# Patient Record
Sex: Male | Born: 1955 | Race: White | Hispanic: No | Marital: Married | Smoking: Never smoker
Health system: Southern US, Community
[De-identification: ages and names within clinical notes are randomized; demographics above are authoritative.]

## PROBLEM LIST (undated history)

## (undated) DIAGNOSIS — I10 Essential (primary) hypertension: Secondary | ICD-10-CM

## (undated) DIAGNOSIS — E7800 Pure hypercholesterolemia, unspecified: Secondary | ICD-10-CM

## (undated) DIAGNOSIS — G4733 Obstructive sleep apnea (adult) (pediatric): Secondary | ICD-10-CM

## (undated) DIAGNOSIS — K219 Gastro-esophageal reflux disease without esophagitis: Secondary | ICD-10-CM

## (undated) DIAGNOSIS — I5032 Chronic diastolic (congestive) heart failure: Secondary | ICD-10-CM

## (undated) DIAGNOSIS — E119 Type 2 diabetes mellitus without complications: Secondary | ICD-10-CM

## (undated) DIAGNOSIS — G4730 Sleep apnea, unspecified: Secondary | ICD-10-CM

## (undated) DIAGNOSIS — R0602 Shortness of breath: Secondary | ICD-10-CM

## (undated) DIAGNOSIS — I2699 Other pulmonary embolism without acute cor pulmonale: Secondary | ICD-10-CM

## (undated) DIAGNOSIS — D689 Coagulation defect, unspecified: Secondary | ICD-10-CM

## (undated) DIAGNOSIS — Z9989 Dependence on other enabling machines and devices: Secondary | ICD-10-CM

## (undated) DIAGNOSIS — L02419 Cutaneous abscess of limb, unspecified: Secondary | ICD-10-CM

## (undated) DIAGNOSIS — E669 Obesity, unspecified: Secondary | ICD-10-CM

## (undated) DIAGNOSIS — L03119 Cellulitis of unspecified part of limb: Secondary | ICD-10-CM

## (undated) DIAGNOSIS — C439 Malignant melanoma of skin, unspecified: Secondary | ICD-10-CM

## (undated) DIAGNOSIS — L03115 Cellulitis of right lower limb: Secondary | ICD-10-CM

## (undated) DIAGNOSIS — I82409 Acute embolism and thrombosis of unspecified deep veins of unspecified lower extremity: Secondary | ICD-10-CM

## (undated) DIAGNOSIS — J40 Bronchitis, not specified as acute or chronic: Secondary | ICD-10-CM

## (undated) DIAGNOSIS — M1990 Unspecified osteoarthritis, unspecified site: Secondary | ICD-10-CM

## (undated) DIAGNOSIS — R609 Edema, unspecified: Secondary | ICD-10-CM

## (undated) HISTORY — PX: SKIN CANCER EXCISION: SHX779

## (undated) HISTORY — DX: Cellulitis of right lower limb: L03.115

## (undated) HISTORY — DX: Sleep apnea, unspecified: G47.30

## (undated) HISTORY — DX: Coagulation defect, unspecified: D68.9

## (undated) HISTORY — DX: Other pulmonary embolism without acute cor pulmonale: I26.99

## (undated) HISTORY — DX: Essential (primary) hypertension: I10

## (undated) HISTORY — DX: Malignant melanoma of skin, unspecified: C43.9

## (undated) HISTORY — DX: Chronic diastolic (congestive) heart failure: I50.32

## (undated) HISTORY — DX: Type 2 diabetes mellitus without complications: E11.9

## (undated) HISTORY — PX: TONSILLECTOMY: SUR1361

## (undated) HISTORY — PX: HERNIA REPAIR: SHX51

---

## 2004-07-17 DIAGNOSIS — I82409 Acute embolism and thrombosis of unspecified deep veins of unspecified lower extremity: Secondary | ICD-10-CM

## 2004-07-17 HISTORY — DX: Acute embolism and thrombosis of unspecified deep veins of unspecified lower extremity: I82.409

## 2004-09-20 ENCOUNTER — Emergency Department: Payer: Self-pay | Admitting: Emergency Medicine

## 2005-10-18 ENCOUNTER — Inpatient Hospital Stay (HOSPITAL_COMMUNITY): Admission: EM | Admit: 2005-10-18 | Discharge: 2005-10-20 | Payer: Self-pay | Admitting: Emergency Medicine

## 2005-10-18 ENCOUNTER — Ambulatory Visit: Payer: Self-pay | Admitting: Pulmonary Disease

## 2007-10-16 ENCOUNTER — Ambulatory Visit: Payer: Self-pay | Admitting: Family Medicine

## 2008-07-15 ENCOUNTER — Emergency Department: Payer: Self-pay | Admitting: Emergency Medicine

## 2008-07-16 ENCOUNTER — Ambulatory Visit: Payer: Self-pay | Admitting: Family Medicine

## 2008-07-18 ENCOUNTER — Emergency Department: Payer: Self-pay | Admitting: Emergency Medicine

## 2009-03-05 ENCOUNTER — Other Ambulatory Visit: Payer: Self-pay | Admitting: Family Medicine

## 2010-04-25 ENCOUNTER — Other Ambulatory Visit: Payer: Self-pay | Admitting: Family Medicine

## 2010-04-26 LAB — PSA

## 2011-05-18 ENCOUNTER — Other Ambulatory Visit: Payer: Self-pay | Admitting: Family Medicine

## 2011-09-18 ENCOUNTER — Encounter (HOSPITAL_COMMUNITY): Payer: Self-pay | Admitting: Emergency Medicine

## 2011-09-18 ENCOUNTER — Emergency Department (HOSPITAL_COMMUNITY)
Admission: EM | Admit: 2011-09-18 | Discharge: 2011-09-18 | Disposition: A | Discharge status: Home / Self Care | Payer: Self-pay | Attending: Emergency Medicine | Admitting: Emergency Medicine

## 2011-09-18 DIAGNOSIS — E119 Type 2 diabetes mellitus without complications: Secondary | ICD-10-CM | POA: Insufficient documentation

## 2011-09-18 DIAGNOSIS — I82819 Embolism and thrombosis of superficial veins of unspecified lower extremities: Secondary | ICD-10-CM

## 2011-09-18 DIAGNOSIS — I509 Heart failure, unspecified: Secondary | ICD-10-CM | POA: Insufficient documentation

## 2011-09-18 DIAGNOSIS — M7989 Other specified soft tissue disorders: Secondary | ICD-10-CM

## 2011-09-18 DIAGNOSIS — I10 Essential (primary) hypertension: Secondary | ICD-10-CM | POA: Insufficient documentation

## 2011-09-18 DIAGNOSIS — Z86718 Personal history of other venous thrombosis and embolism: Secondary | ICD-10-CM | POA: Insufficient documentation

## 2011-09-18 DIAGNOSIS — E7800 Pure hypercholesterolemia, unspecified: Secondary | ICD-10-CM | POA: Insufficient documentation

## 2011-09-18 HISTORY — DX: Pure hypercholesterolemia, unspecified: E78.00

## 2011-09-18 HISTORY — DX: Acute embolism and thrombosis of unspecified deep veins of unspecified lower extremity: I82.409

## 2011-09-18 HISTORY — DX: Obesity, unspecified: E66.9

## 2011-09-18 NOTE — ED Notes (Signed)
Pt alert, NAD, calm, interactive, skin W&D,resps e/u, vasc doppler tech & supervisor paged to confirm study.

## 2011-09-18 NOTE — ED Provider Notes (Signed)
Michael Martinez S 8:00 PM patient discussed with Dr. Denton Lank in sign out. Patient presents with left lower leg swelling and has history of DVT. Patient awaiting vascular Doppler study. The study positive plan treat with Lovenox and Coumadin with followup. If negative patient is fine to return home with followup.  8:40 PM vascular Dopplers check is now bedside performing study.   Preliminary report shows: "Superficial thrombosis noted in the left GSV from mid thigh to the ankle. No evidence of DVT."  I discussed findings with patient. At this time treat symptoms with warm compress, elevation and compression stockings.  Patient instructed to follow up with PCP.    Angus Seller, [name] 09/18/11 2219

## 2011-09-18 NOTE — ED Notes (Signed)
PT. REPORTS PERSISTENT LEFT ANKLE/LEFT LOWER LEG SWELLING FOR 1 WEEK , DENIES INJURY OR FALL , SEEN AT AN URGENT CARE - ADVISED TO INCREASE GOUT MEDICATIONS WITH NO RELIEF , CONCERNED ABOUT DVT .

## 2011-09-18 NOTE — ED Notes (Signed)
Spoke with Vascular Tech,stated it will be about a hour before study is done

## 2011-09-18 NOTE — ED Notes (Signed)
Vasc. Tech contacted, leaving [facility] now, will be about 20 minutes, pt & family updated.

## 2011-09-18 NOTE — ED Notes (Signed)
EDPA into see/speak with pt, vasc tech at [facility], pt moved to room 25, no changes, report given to [facility], Charity fundraiser. Pt calm, NAD, alert, interactive.

## 2011-09-18 NOTE — ED Notes (Signed)
Onset one week ago left lower extremity swelling improves while laying down however upon standing for long period of time edema returned.  Currently left lower extremity edema +2 with pedal pulse left foot +1 right pedal pulse +2.  Pain at rest 0/10 with palpation or ambulating pain increases 2/10 achy dull. Airway intact bilateral equal chest rise and fall. Two family members at bedside

## 2011-09-18 NOTE — ED Notes (Signed)
Pt. Changed into a gown and given a pillow for comfort.

## 2011-09-18 NOTE — Progress Notes (Signed)
VASCULAR LAB PRELIMINARY  PRELIMINARY  PRELIMINARY  PRELIMINARY  Left lower extremity venous duplex completed.    Preliminary report:  Superficial thrombosis noted in the left GSV from mid thigh to the ankle.   No evidence of DVT.  Terance Hart, RVT 09/18/2011, 9:07 PM

## 2011-09-18 NOTE — ED Provider Notes (Signed)
History     CSN: [id number]  Arrival date & time 09/18/11  1426   First MD Initiated Contact with Patient 09/18/11 1812      Chief Complaint  Patient presents with  . Leg Swelling    (Consider location/radiation/quality/duration/timing/severity/associated sxs/prior treatment) The history is provided by the patient.  pt with left lower leg swelling x 1 week. Gradual onset. Constant. Hx dvt 2006, not on coumadin for years. No chest pain or sob. Also states hx gout, but swelling no better after trying his gout meds. No swelling to other leg. No numbness/weakness. No skin changes or erythema. No sob or orthopnea. No injury to leg.   Past Medical History  Diagnosis Date  . DVT (deep venous thrombosis)   . Gout   . Obesity   . Hypertension   . Diabetes mellitus   . Hypercholesteremia   . CHF (congestive heart failure)     History reviewed. No pertinent past surgical history.  No family history on file.  History  Substance Use Topics  . Smoking status: Never Smoker   . Smokeless tobacco: Not on file  . Alcohol Use: No      Review of Systems  Respiratory: Negative for shortness of breath.   Cardiovascular: Positive for leg swelling. Negative for chest pain.  Skin: Negative for rash.  Neurological: Negative for syncope and light-headedness.    Allergies  Penicillins  Home Medications  No current outpatient prescriptions on file.  BP 151/95  Pulse 81  Temp(Src) 98.3 F (36.8 C) (Oral)  Resp 18  SpO2 98%  Physical Exam  Nursing note and vitals reviewed. Constitutional: He is oriented to person, place, and time. He appears well-developed and well-nourished. No distress.  HENT:  Head: Atraumatic.  Eyes: Pupils are equal, round, and reactive to light.  Neck: Neck supple. No tracheal deviation present.  Cardiovascular: Normal rate.   Pulmonary/Chest: Effort normal. No accessory muscle usage. No respiratory distress.  Abdominal: He exhibits no distension.    Musculoskeletal: Normal range of motion.       Left lower leg moderately swollen as compared to right. Distal pulses palp. No pain w rom at hip, knee or ankle. No skin changes or erythema. No focal sts or focal bony tenderness.   Neurological: He is alert and oriented to person, place, and time.       Motor intact bil  Skin: Skin is warm and dry.  Psychiatric: He has a normal mood and affect.    ED Course  Procedures (including critical care time)    MDM  Vascular doppler ordered (given hx, phys exam, suspect possible dvt).   Vascular doppler pending - pt informed of delay - tech at [facility].   Signed out to oncoming ST PA to check vascular doppler when back. If positive, will need rx w lovenox and coumadin, dvt instructions, and pcp follow up in the next couple days. If neg, elevation, close pcp f/u.      Suzi Roots, MD 09/18/11 1949

## 2011-09-18 NOTE — Discharge Instructions (Signed)
You were seen and evaluated today for swelling of your left lower leg. At this time your ultrasound study showed a small superficial blood clot. This can be treated with warm compresses, compression stockings and elevation. It is also recommended that you've discuss with your primary care provider about taking a baby aspirin daily. You should call your doctor for a followup appointment first thing tomorrow. If you develop any worsening symptoms, chest pain, shortness of breath please return to the emergency room.  Edema Edema is an abnormal build-up of fluids in tissues. Because this is partly dependent on gravity (water flows to the lowest place), it is more common in the leg sand thighs (lower extremities). It is also common in the looser tissues, like around the eyes. Painless swelling of the feet and ankles is common and increases as a person ages. It may affect both legs and may include the calves or even thighs. When squeezed, the fluid may move out of the affected area and may leave a dent for a few moments. CAUSES   Prolonged standing or sitting in one place for extended periods of time. Movement helps pump tissue fluid into the veins, and absence of movement prevents this, resulting in edema.   Varicose veins. The valves in the veins do not work as well as they should. This causes fluid to leak into the tissues.   Fluid and salt overload.   Injury, burn, or surgery to the leg, ankle, or foot, may damage veins and allow fluid to leak out.   Sunburn damages vessels. Leaky vessels allow fluid to go out into the sunburned tissues.   Allergies (from insect bites or stings, medications or chemicals) cause swelling by allowing vessels to become leaky.   Protein in the blood helps keep fluid in your vessels. Low protein, as in malnutrition, allows fluid to leak out.   Hormonal changes, including pregnancy and menstruation, cause fluid retention. This fluid may leak out of vessels and cause  edema.   Medications that cause fluid retention. Examples are sex hormones, blood pressure medications, steroid treatment, or anti-depressants.   Some illnesses cause edema, especially heart failure, kidney disease, or liver disease.   Surgery that cuts veins or lymph nodes, such as surgery done for the heart or for breast cancer, may result in edema.  DIAGNOSIS  Your caregiver is usually easily able to determine what is causing your swelling (edema) by simply asking what is wrong (getting a history) and examining you (doing a physical). Sometimes x-rays, EKG (electrocardiogram or heart tracing), and blood work may be done to evaluate for underlying medical illness. TREATMENT  General treatment includes:  Leg elevation (or elevation of the affected body part).   Restriction of fluid intake.   Prevention of fluid overload.   Compression of the affected body part. Compression with elastic bandages or support stockings squeezes the tissues, preventing fluid from entering and forcing it back into the blood vessels.   Diuretics (also called water pills or fluid pills) pull fluid out of your body in the form of increased urination. These are effective in reducing the swelling, but can have side effects and must be used only under your caregiver's supervision. Diuretics are appropriate only for some types of edema.  The specific treatment can be directed at any underlying causes discovered. Heart, liver, or kidney disease should be treated appropriately. HOME CARE INSTRUCTIONS   Elevate the legs (or affected body part) above the level of the heart, while lying down.  Avoid sitting or standing still for prolonged periods of time.   Avoid putting anything directly under the knees when lying down, and do not wear constricting clothing or garters on the upper legs.   Exercising the legs causes the fluid to work back into the veins and lymphatic channels. This may help the swelling go down.    The pressure applied by elastic bandages or support stockings can help reduce ankle swelling.   A low-salt diet may help reduce fluid retention and decrease the ankle swelling.   Take any medications exactly as prescribed.  SEEK MEDICAL CARE IF:  Your edema is not responding to recommended treatments. SEEK IMMEDIATE MEDICAL CARE IF:   You develop shortness of breath or chest pain.   You cannot breathe when you lay down; or if, while lying down, you have to get up and go to the window to get your breath.   You are having increasing swelling without relief from treatment.   You develop a fever over 102 F (38.9 C).   You develop pain or redness in the areas that are swollen.   Tell your caregiver right away if you have gained 3 lb/1.4 kg in 1 day or 5 lb/2.3 kg in a week.  MAKE SURE YOU:   Understand these instructions.   Will watch your condition.   Will get help right away if you are not doing well or get worse.  Document Released: 07/03/2005 Document Revised: 06/22/2011 Document Reviewed: 02/19/2008 [facility] Patient Information 2012 [place], [place].    RESOURCE GUIDE  Dental Problems  Patients with Medicaid: [facility] [phone] [address]                                           [phone] Phone:  [phone]                                                  Phone:  [phone]  If unable to pay or uninsured, contact:  [facility] or [facility]. to become qualified for the adult dental clinic.  Chronic Pain Problems Contact [facility]  [phone] Patients need to be referred by their primary care doctor.  Insufficient Money for Medicine Contact [employer]:  call "211" or [facility] 806-101-9404.  No Primary Care Doctor Call [facility]  424-136-5455 Other agencies that provide inexpensive medical care    [facility]  [phone]    [facility]  Internal Medicine  [phone]    [facility]  [phone]    [facility]  [phone]    [facility]  [phone]    [facility]  [phone]  Psychological Services [facility]  [phone] [facility] Services  [phone] [facility]   [phone] (emergency services [phone])  Substance Abuse Resources [facility]  [phone] [facility] [phone] [facility] [phone] Floydene Flock [phone] Residential & Outpatient Substance Abuse Program  ([phone]  Abuse/Neglect [facility] Child Abuse Hotline 807-408-7581 [facility] Child Abuse Hotline 306-331-0189 (After Hours)  Emergency Shelter [employer] (601)742-1011  Maternity Homes [facility] [phone] [facility] [phone]  MRSA Hotline #:   518-540-1804    [facility] Resources  [facility]     [employer]                          [facility] Dept. [address] [place]                       [address]      [address]  Blondell Reveal Phone:  [phone]                                   Phone:  [phone]                 Phone:  [phone]  [facility] Phone:  [phone]  [facility] Child Abuse Hotline [phone] (After Hours)

## 2011-09-19 NOTE — ED Provider Notes (Signed)
Medical screening examination/treatment/procedure(s) were conducted as a shared visit with non-physician practitioner(s) and myself.  I personally evaluated the patient during the encounter   Suzi Roots, MD 09/19/11 1431

## 2011-10-18 ENCOUNTER — Ambulatory Visit: Payer: Self-pay | Admitting: Cardiovascular Disease

## 2012-10-11 ENCOUNTER — Inpatient Hospital Stay (HOSPITAL_COMMUNITY)
Admission: EM | Admit: 2012-10-11 | Discharge: 2012-10-12 | DRG: 603 | Disposition: A | Discharge status: Home / Self Care | Payer: MEDICAID | Attending: Internal Medicine | Admitting: Internal Medicine

## 2012-10-11 ENCOUNTER — Encounter (HOSPITAL_COMMUNITY): Payer: Self-pay | Admitting: *Deleted

## 2012-10-11 DIAGNOSIS — Z880 Allergy status to penicillin: Secondary | ICD-10-CM

## 2012-10-11 DIAGNOSIS — I10 Essential (primary) hypertension: Secondary | ICD-10-CM

## 2012-10-11 DIAGNOSIS — L03115 Cellulitis of right lower limb: Secondary | ICD-10-CM

## 2012-10-11 DIAGNOSIS — I509 Heart failure, unspecified: Secondary | ICD-10-CM | POA: Diagnosis present

## 2012-10-11 DIAGNOSIS — L02419 Cutaneous abscess of limb, unspecified: Principal | ICD-10-CM | POA: Diagnosis present

## 2012-10-11 DIAGNOSIS — Z6841 Body Mass Index (BMI) 40.0 and over, adult: Secondary | ICD-10-CM

## 2012-10-11 DIAGNOSIS — E119 Type 2 diabetes mellitus without complications: Secondary | ICD-10-CM | POA: Diagnosis present

## 2012-10-11 DIAGNOSIS — G4733 Obstructive sleep apnea (adult) (pediatric): Secondary | ICD-10-CM

## 2012-10-11 DIAGNOSIS — E7800 Pure hypercholesterolemia, unspecified: Secondary | ICD-10-CM | POA: Diagnosis present

## 2012-10-11 DIAGNOSIS — B009 Herpesviral infection, unspecified: Secondary | ICD-10-CM | POA: Diagnosis present

## 2012-10-11 DIAGNOSIS — Z86718 Personal history of other venous thrombosis and embolism: Secondary | ICD-10-CM

## 2012-10-11 DIAGNOSIS — Z79899 Other long term (current) drug therapy: Secondary | ICD-10-CM

## 2012-10-11 DIAGNOSIS — M109 Gout, unspecified: Secondary | ICD-10-CM | POA: Diagnosis present

## 2012-10-11 LAB — CBC WITH DIFFERENTIAL/PLATELET
Basophils Absolute: 0 10*3/uL (ref 0.0–0.1)
Basophils Relative: 0 % (ref 0–1)
Eosinophils Absolute: 0.2 10*3/uL (ref 0.0–0.7)
Eosinophils Relative: 2 % (ref 0–5)
HCT: 41.4 % (ref 39.0–52.0)
Hemoglobin: 14.5 g/dL (ref 13.0–17.0)
MCHC: 35 g/dL (ref 30.0–36.0)
MCV: 82.6 fL (ref 78.0–100.0)
Monocytes Relative: 7 % (ref 3–12)
Platelets: 188 10*3/uL (ref 150–400)

## 2012-10-11 LAB — COMPREHENSIVE METABOLIC PANEL
ALT: 18 U/L (ref 0–53)
AST: 18 U/L (ref 0–37)
Alkaline Phosphatase: 111 U/L (ref 39–117)
BUN: 20 mg/dL (ref 6–23)
Calcium: 9.1 mg/dL (ref 8.4–10.5)
Total Bilirubin: 0.3 mg/dL (ref 0.3–1.2)
Total Protein: 6.8 g/dL (ref 6.0–8.3)

## 2012-10-11 MED ORDER — ACETAMINOPHEN 325 MG PO TABS
650.0000 mg | ORAL_TABLET | Freq: Once | ORAL | Status: AC
  Administered 2012-10-12: 650 mg via ORAL
  Filled 2012-10-11: qty 2

## 2012-10-11 MED ORDER — CLINDAMYCIN PHOSPHATE 600 MG/50ML IV SOLN
600.0000 mg | Freq: Four times a day (QID) | INTRAVENOUS | Status: DC
  Administered 2012-10-12 (×3): 600 mg via INTRAVENOUS
  Filled 2012-10-11 (×6): qty 50

## 2012-10-11 NOTE — ED Notes (Signed)
CBG checked 149.  

## 2012-10-11 NOTE — ED Notes (Signed)
Pt c/o   Redness swelling and pain inhis rt lower leg with a temp on Wednesday.  Hx of dvt lt lower leg.   The pt  Is a diabetic

## 2012-10-12 DIAGNOSIS — I10 Essential (primary) hypertension: Secondary | ICD-10-CM

## 2012-10-12 DIAGNOSIS — L03115 Cellulitis of right lower limb: Secondary | ICD-10-CM

## 2012-10-12 DIAGNOSIS — R609 Edema, unspecified: Secondary | ICD-10-CM

## 2012-10-12 DIAGNOSIS — E119 Type 2 diabetes mellitus without complications: Secondary | ICD-10-CM

## 2012-10-12 DIAGNOSIS — M79609 Pain in unspecified limb: Secondary | ICD-10-CM

## 2012-10-12 DIAGNOSIS — G4733 Obstructive sleep apnea (adult) (pediatric): Secondary | ICD-10-CM

## 2012-10-12 DIAGNOSIS — Z9989 Dependence on other enabling machines and devices: Secondary | ICD-10-CM

## 2012-10-12 DIAGNOSIS — L02419 Cutaneous abscess of limb, unspecified: Principal | ICD-10-CM

## 2012-10-12 LAB — HEPARIN LEVEL (UNFRACTIONATED): Heparin Unfractionated: 0.11 IU/mL — ABNORMAL LOW (ref 0.30–0.70)

## 2012-10-12 LAB — COMPREHENSIVE METABOLIC PANEL
AST: 12 U/L (ref 0–37)
CO2: 26 mEq/L (ref 19–32)
Calcium: 8.7 mg/dL (ref 8.4–10.5)
Chloride: 102 mEq/L (ref 96–112)
GFR calc Af Amer: 78 mL/min — ABNORMAL LOW (ref >90)
Potassium: 3.7 mEq/L (ref 3.5–5.1)
Sodium: 138 mEq/L (ref 135–145)

## 2012-10-12 LAB — CBC
HCT: 38.6 % — ABNORMAL LOW (ref 39.0–52.0)
MCV: 82.5 fL (ref 78.0–100.0)
Platelets: 186 10*3/uL (ref 150–400)
RBC: 4.68 MIL/uL (ref 4.22–5.81)
RDW: 13.9 % (ref 11.5–15.5)
WBC: 6.2 10*3/uL (ref 4.0–10.5)

## 2012-10-12 LAB — GLUCOSE, CAPILLARY

## 2012-10-12 LAB — HEMOGLOBIN A1C
Hgb A1c MFr Bld: 7 % — ABNORMAL HIGH (ref <5.7)
Mean Plasma Glucose: 154 mg/dL — ABNORMAL HIGH (ref <117)

## 2012-10-12 MED ORDER — CLINDAMYCIN HCL 300 MG PO CAPS: 300.0000 mg | ORAL_CAPSULE | Freq: Three times a day (TID) | ORAL | Status: DC

## 2012-10-12 MED ORDER — SIMVASTATIN 20 MG PO TABS
20.0000 mg | ORAL_TABLET | Freq: Every day | ORAL | Status: DC
  Filled 2012-10-12: qty 1

## 2012-10-12 MED ORDER — ACETAMINOPHEN 650 MG RE SUPP: 650.0000 mg | Freq: Four times a day (QID) | RECTAL | Status: DC | PRN

## 2012-10-12 MED ORDER — DIAZEPAM 5 MG PO TABS: 5.0000 mg | ORAL_TABLET | Freq: Three times a day (TID) | ORAL | Status: DC | PRN

## 2012-10-12 MED ORDER — SODIUM CHLORIDE 0.9 % IV SOLN
INTRAVENOUS | Status: DC
  Administered 2012-10-12: 05:00:00 via INTRAVENOUS

## 2012-10-12 MED ORDER — INSULIN ASPART 100 UNIT/ML ~~LOC~~ SOLN
0.0000 [IU] | SUBCUTANEOUS | Status: DC
  Administered 2012-10-12: 1 [IU] via SUBCUTANEOUS
  Administered 2012-10-12: 2 [IU] via SUBCUTANEOUS

## 2012-10-12 MED ORDER — HEPARIN BOLUS VIA INFUSION
4000.0000 [IU] | Freq: Once | INTRAVENOUS | Status: AC
  Administered 2012-10-12: 4000 [IU] via INTRAVENOUS

## 2012-10-12 MED ORDER — ONDANSETRON HCL 4 MG/2ML IJ SOLN: 4.0000 mg | Freq: Four times a day (QID) | INTRAMUSCULAR | Status: DC | PRN

## 2012-10-12 MED ORDER — VALACYCLOVIR HCL 500 MG PO TABS
500.0000 mg | ORAL_TABLET | Freq: Every day | ORAL | Status: DC | PRN
  Administered 2012-10-12: 500 mg via ORAL
  Filled 2012-10-12 (×2): qty 1

## 2012-10-12 MED ORDER — HYDROCHLOROTHIAZIDE 12.5 MG PO CAPS
12.5000 mg | ORAL_CAPSULE | Freq: Every day | ORAL | Status: DC
  Administered 2012-10-12: 12.5 mg via ORAL
  Filled 2012-10-12: qty 1

## 2012-10-12 MED ORDER — ALUM & MAG HYDROXIDE-SIMETH 200-200-20 MG/5ML PO SUSP: 30.0000 mL | Freq: Four times a day (QID) | ORAL | Status: DC | PRN

## 2012-10-12 MED ORDER — ONDANSETRON HCL 4 MG PO TABS: 4.0000 mg | ORAL_TABLET | Freq: Four times a day (QID) | ORAL | Status: DC | PRN

## 2012-10-12 MED ORDER — LISINOPRIL-HYDROCHLOROTHIAZIDE 20-12.5 MG PO TABS: 1.0000 | ORAL_TABLET | Freq: Every day | ORAL | Status: DC

## 2012-10-12 MED ORDER — HEPARIN (PORCINE) IN NACL 100-0.45 UNIT/ML-% IJ SOLN
1800.0000 [IU]/h | INTRAMUSCULAR | Status: DC
  Administered 2012-10-12: 1800 [IU]/h via INTRAVENOUS
  Administered 2012-10-12: 1500 [IU]/h via INTRAVENOUS
  Filled 2012-10-12 (×2): qty 250

## 2012-10-12 MED ORDER — HYDROCODONE-ACETAMINOPHEN 5-325 MG PO TABS: 1.0000 | ORAL_TABLET | ORAL | Status: DC | PRN

## 2012-10-12 MED ORDER — ACETAMINOPHEN 325 MG PO TABS: 650.0000 mg | ORAL_TABLET | Freq: Four times a day (QID) | ORAL | Status: DC | PRN

## 2012-10-12 MED ORDER — FUROSEMIDE 20 MG PO TABS
10.0000 mg | ORAL_TABLET | Freq: Every day | ORAL | Status: DC | PRN
  Filled 2012-10-12: qty 0.5

## 2012-10-12 MED ORDER — LORATADINE 10 MG PO TABS
10.0000 mg | ORAL_TABLET | Freq: Every day | ORAL | Status: DC
  Administered 2012-10-12: 10 mg via ORAL
  Filled 2012-10-12: qty 1

## 2012-10-12 MED ORDER — LISINOPRIL 20 MG PO TABS
20.0000 mg | ORAL_TABLET | Freq: Every day | ORAL | Status: DC
  Administered 2012-10-12: 20 mg via ORAL
  Filled 2012-10-12: qty 1

## 2012-10-12 NOTE — H&P (Addendum)
Internal Medicine Teaching Service Attending Note Date: 10/12/2012  Patient name: Michael Martinez  Medical record number: [id number]  Date of birth: 1956/02/04  I have read the documentation on this case by Dr. Sherrine Maples and agree with it with the following additions/observations:   Michael Martinez is a 57 year old, very obese white gentleman with past medical history pertinent for LLE DVT in 2006 (following leg trauma and surgery), superficial thrombophlebitis in 2013, and diabetes mellitus type 2 on metformin, and he comes in with swelling, redness and warmth in right calf starting Wednesday. He also experienced fever and chills - once on Wednesday upto 102F and second time on Thursday upto 101F. He has not had any fever since hospitalization. He denies any chest pain, shortness of breath, trauma to his leg, or open wounds. He does have eczema. He reports that his left leg has been normal this time. He wears compression stockings on his legs daily for chronic leg swelling.    has a past medical history of DVT (deep venous thrombosis); Gout; Obesity; Hypertension; Diabetes mellitus; Hypercholesteremia; and CHF (congestive heart failure). Sx history for abdominal hernia.   Exam:  Vitals reviewed. Filed Vitals:   10/12/12 0300 10/12/12 0321 10/12/12 0503 10/12/12 0615  BP: 114/71  114/65   Pulse: 83  78 72  Temp:   98.1 F (36.7 C)   TempSrc:   Oral   Resp: 20  19 16   Height:  6' (1.829 m)    Weight:  378 lb (171.46 kg)    SpO2: 98%  96% 97%    General: Resting in bed, obese. HEENT: PERRL, EOMI, no scleral icterus. Heart: RRR, no rubs, murmurs or gallops. Lungs: Clear to auscultation bilaterally, no wheezes, rales, or rhonchi. Abdomen: Soft, nontender, nondistended, BS present. The patient has surgical scars and no umbilicus.  Extremities: Right calf: erythematous, swollen, warm to touch, mildly tender, pulses present bilaterally. Left calf: has a red eczematous patch. Neuro: Alert and oriented  X3, cranial nerves II-XII grossly intact, no motor and sensory abnormality.  EKG, imaging and labs reviewed.    Assessment and Plan   1. Right leg cellulitis versus DVT - Has been started on clindamycin, has penicillin allergy. Agree with that. Blood cultures done and report will be awaited. Temperature being monitored, no spikes since start of antibiotics. Doppler will be done today. Heparin can be d/c'd if no DVT.   2. Diabetes - Agree with stopping metformin for now and putting on SSI. We will get A1c. Blood glucose remains high, getting better. We will get an A1c to understand how uncontrolled he has been and if there needs to be a change in therapy on discharge. He will need an early follow up with his PCP.   Recent Labs Lab 10/11/12 2049 10/12/12 0506 10/12/12 0759  GLUCAP 149* 174* 115*     Recent Labs Lab 10/11/12 2041 10/12/12 0655  NA 137 138  K 4.2 3.7  CL 100 102  CO2 28 26  GLUCOSE 173* 144*  BUN 20 19  CREATININE 1.21 1.17  CALCIUM 9.1 8.7   Rest per resident note.   Fatih Stalvey 10/12/2012, 9:47 AM.

## 2012-10-12 NOTE — Progress Notes (Signed)
UR completed 

## 2012-10-12 NOTE — ED Provider Notes (Signed)
History     CSN: [id number]  Arrival date & time 10/11/12  2027   First MD Initiated Contact with Patient 10/11/12 2336      Chief Complaint  Patient presents with  . Leg Pain    (Consider location/radiation/quality/duration/timing/severity/associated sxs/prior treatment) HPI 57 year old male presents emergency department from home with complaint of right leg redness, swelling, and pain along with fevers.  Patient reports onset of fevers, Wednesday.  Fever as high as 102.  No associated symptoms with the fever.  Fever had resolved mostly within 24 hours.  The next day, patient noted tenderness to palpation of his right calf and shin area.  Symptoms seemed to improve slightly during the day, but tonight when he was taking off his compression stockings, he noticed the redness and swelling was back.  Patient has history of DVT in left lower leg after foot fracture.  This was in 2006.  He has history of superficial thrombophlebitis about a year ago.  Patient is a diabetic, reports history of gout.  Noted to have low-grade fever here today. Past Medical History  Diagnosis Date  . DVT (deep venous thrombosis)   . Gout   . Obesity   . Hypertension   . Diabetes mellitus   . Hypercholesteremia   . CHF (congestive heart failure)     History reviewed. No pertinent past surgical history.  No family history on file.  History  Substance Use Topics  . Smoking status: Never Smoker   . Smokeless tobacco: Not on file  . Alcohol Use: No      Review of Systems  See History of Present Illness; otherwise all other systems are reviewed and negative Allergies  Penicillins  Home Medications   Current Outpatient Rx  Name  Route  Sig  Dispense  Refill  . diazepam (VALIUM) 5 MG tablet   Oral   Take 5 mg by mouth every 8 (eight) hours as needed. For muscle spasm         . furosemide (LASIX) 20 MG tablet   Oral   Take 20 mg by mouth daily.         Marland Kitchen HYDROcodone-acetaminophen (VICODIN)  5-500 MG per tablet   Oral   Take 1 tablet by mouth every 4 (four) hours as needed. For pain.         . indomethacin (INDOCIN) 25 MG capsule   Oral   Take 25 mg by mouth 3 (three) times daily as needed. For gout flare ups         . lisinopril (PRINIVIL,ZESTRIL) 20 MG tablet   Oral   Take 20 mg by mouth daily.         Marland Kitchen lovastatin (MEVACOR) 40 MG tablet   Oral   Take 40 mg by mouth at bedtime.         . metFORMIN (GLUCOPHAGE) 500 MG tablet   Oral   Take 500 mg by mouth 2 (two) times daily with a meal.           BP 109/67  Pulse 80  Temp(Src) 99 F (37.2 C) (Oral)  Resp 22  SpO2 99%  Physical Exam  Nursing note and vitals reviewed. Constitutional: He is oriented to person, place, and time. He appears well-developed and well-nourished.  HENT:  Head: Normocephalic and atraumatic.  Nose: Nose normal.  Mouth/Throat: Oropharynx is clear and moist.  Eyes: Conjunctivae and EOM are normal. Pupils are equal, round, and reactive to light.  Neck: Normal range of motion.  Neck supple. No JVD present. No tracheal deviation present. No thyromegaly present.  Cardiovascular: Normal rate, regular rhythm, normal heart sounds and intact distal pulses.  Exam reveals no gallop and no friction rub.   No murmur heard. Pulmonary/Chest: Effort normal and breath sounds normal. No stridor. No respiratory distress. He has no wheezes. He has no rales. He exhibits no tenderness.  Abdominal: Soft. Bowel sounds are normal. He exhibits no distension and no mass. There is no tenderness. There is no rebound and no guarding.  Musculoskeletal: Normal range of motion. He exhibits edema and tenderness.  Patient with erythema and warmth to the anterior portion of his right lower leg.  Patient reports tenderness with palpation to the posterior compartments.  Compartments are soft.  He has full range of motion at knee and ankle.  No skin breakdown noted.  Normal sensation.  Pulses are normal   Lymphadenopathy:    He has no cervical adenopathy.  Neurological: He is alert and oriented to person, place, and time. He exhibits normal muscle tone. Coordination normal.  Skin: Skin is warm and dry. No rash noted. No erythema. No pallor.  Psychiatric: He has a normal mood and affect. His behavior is normal. Judgment and thought content normal.    ED Course  Procedures (including critical care time)  Labs Reviewed  COMPREHENSIVE METABOLIC PANEL - Abnormal; Notable for the following:    Glucose, Bld 173 (*)    Albumin 3.2 (*)    GFR calc non Af Amer 64 (*)    GFR calc Af Amer 75 (*)    All other components within normal limits  GLUCOSE, CAPILLARY - Abnormal; Notable for the following:    Glucose-Capillary 149 (*)    All other components within normal limits  CULTURE, BLOOD (ROUTINE X 2)  CULTURE, BLOOD (ROUTINE X 2)  CBC WITH DIFFERENTIAL  PROTIME-INR   No results found.   1. Cellulitis of right anterior lower leg       MDM  57 year old male with what appears to be cellulitis in the right lower leg, associated with fevers.  Unable to get workup for DVT, which is also in the differential at this hour.  Will discuss with hospitalist for observation for IV antibiotics and DVT evaluation in the morning.        Olivia Mackie, MD 10/12/12 [phone]

## 2012-10-12 NOTE — Discharge Summary (Signed)
Internal Medicine Teaching [facility] Discharge Note  Name: Michael Martinez MRN: [id number] DOB: [birth year] 57 y.o.  Date of Admission: 10/11/2012 11:34 PM Date of Discharge: 10/12/2012 Attending Physician: Aletta Edouard, MD  Discharge Diagnosis: Principal Problem:   Cellulitis of right anterior lower leg Active Problems:   Diabetes   HTN (hypertension)   Morbid obesity   OSA on CPAP  Discharge Medications:   Medication List    TAKE these medications       acetaminophen 325 MG tablet  Commonly known as:  TYLENOL  Take 650 mg by mouth every 6 (six) hours as needed for pain.     cetirizine 10 MG tablet  Commonly known as:  ZYRTEC  Take 10 mg by mouth daily as needed for allergies.     clindamycin 300 MG capsule  Commonly known as:  CLEOCIN  Take 1 capsule (300 mg total) by mouth 3 (three) times daily.     diazepam 5 MG tablet  Commonly known as:  VALIUM  Take 5 mg by mouth every 8 (eight) hours as needed. For muscle spasm     fish oil-omega-3 fatty acids 1000 MG capsule  Take 2 g by mouth daily.     furosemide 20 MG tablet  Commonly known as:  LASIX  Take 10 mg by mouth daily as needed (for fluid).     HYDROcodone-acetaminophen 5-500 MG per tablet  Commonly known as:  VICODIN  Take 1 tablet by mouth every 4 (four) hours as needed. For pain.     ibuprofen 200 MG tablet  Commonly known as:  ADVIL,MOTRIN  Take 200 mg by mouth every 6 (six) hours as needed for pain.     indomethacin 25 MG capsule  Commonly known as:  INDOCIN  Take 25 mg by mouth 3 (three) times daily as needed. For gout flare ups     lisinopril-hydrochlorothiazide 20-12.5 MG per tablet  Commonly known as:  PRINZIDE,ZESTORETIC  Take 1 tablet by mouth daily.     lovastatin 40 MG tablet  Commonly known as:  MEVACOR  Take 40 mg by mouth at bedtime.     meloxicam 7.5 MG tablet  Commonly known as:  MOBIC  Take 7.5 mg by mouth 2 (two) times daily.     metFORMIN 500 MG tablet  Commonly  known as:  GLUCOPHAGE  Take 500 mg by mouth 2 (two) times daily with a meal.     multivitamin with minerals Tabs  Take 1 tablet by mouth daily.     Phendimetrazine Tartrate 105 MG Cp24  Take 1 capsule by mouth daily.     valACYclovir 500 MG tablet  Commonly known as:  VALTREX  Take 500 mg by mouth daily as needed (for fever blisters).     vardenafil 20 MG tablet  Commonly known as:  LEVITRA  Take 20 mg by mouth daily as needed for erectile dysfunction.        Disposition and follow-up:   [name] was discharged from [facility] in Good condition.  At the hospital follow up visit please address  1. Improvement of cellulitis  Follow-up Appointments:     Follow-up Information   Follow up with MORRISEY,LEMONT, MD. Schedule an appointment as soon as possible for a visit on 10/16/2012.   Contact information:   [facility] [address] [place] [address] [phone]      Discharge Orders   Future Orders Complete By Expires     Call  MD for:  difficulty breathing, headache or visual disturbances  As directed     Call MD for:  extreme fatigue  As directed     Call MD for:  persistant dizziness or light-headedness  As directed     Call MD for:  persistant nausea and vomiting  As directed     Call MD for:  temperature >100.4  As directed     Diet - low sodium heart healthy  As directed     Discharge instructions  As directed     Comments:      -You were diagnosed with cellulitis, which means an infection of your skin. -Please take Clindamycin 300mg  three times a day for 5 days total. -Be sure to schedule a hospital follow up with your primary care doctor  -If you develop uncontrollable diarrhea, please contact a health care professional immediately.    Increase activity slowly  As directed        Consultations:  None  Procedures Performed:   RLE Venous Duplex (10/12/12) Study information: Portable. Study  status: Routine. Procedure: A vascular evaluation was performed with the patient in the supine position. The right common femoral, right femoral, right greater saphenous, right lesser saphenous, right profunda femoral, right popliteal, right peroneal, right posterior tibial, and left common femoral veins were studied. Image quality was adequate. Right lower extremity venous duplex evaluation. Doppler flow study including B-mode compression maneuvers of all visualized segments, color flow Doppler and selected views of pulsed wave Doppler. Location: Bedside. Patient status: Inpatient. Venous flow: +--------------------------+-------+----------------------+ Location OverallFlow properties  +--------------------------+-------+----------------------+ Right common femoral Patent Phasic; spontaneous;    compressible  +--------------------------+-------+----------------------+ Right femoral Patent Compressible  +--------------------------+-------+----------------------+ Right profunda femoral Patent Compressible  +--------------------------+-------+----------------------+ Right popliteal Patent Phasic; spontaneous;    compressible  +--------------------------+-------+----------------------+ Right posterior tibial Patent Compressible  +--------------------------+-------+----------------------+ Right peroneal Patent Compressible  +--------------------------+-------+----------------------+ Right saphenofemoral Patent Compressible  junction    +--------------------------+-------+----------------------+ Right greater saphenous Patent Compressible  +--------------------------+-------+----------------------+ Right lesser saphenous Patent Compressible  +--------------------------+-------+----------------------+ Left common femoral Patent Phasic; spontaneous;    compressible   +--------------------------+-------+----------------------+  ------------------------------------------------------------ Summary: - No evidence of deep vein or superficial thrombosis involving the left lower extremity and right common femoral vein. - No evidence of Baker's cyst on the left.  Admission HPI:  57yo M with PMH HTN, gout, DM II, previous LLE DVT in '06, treated with Lovenox/Coumadin x 2mo and superficial thrombophlebitis in '13, presents to the ED with RLE edema, erythema, and tenderness since Wednesday. He states that he woke up early Wednesday morning with chills and diaphoresis. He took his temperature and it was 102F. The next day, he endorses general malaise, which subsided by Thursday. He states that since he was feeling better, he and his wife drove over to the next town. Prior to leaving, he noticed some tenderness in his right lower leg, but is unsure if there was any redness. He states that he does have chronic lower extremity edema and wears compression stockings. He did put them on on Thursday night but notices increased edema and redness of his RLE once he returned home and took off the stocking. He then elevated his foot, which he states did improved the redness and swelling, but on Friday, it returned. He decided to come to the ED for evaluation because of his previous DVT history. He denies any trauma to his leg and denies any recent sores or wounds. He does have a h/o DM II but states that he is not sure how  well controlled his blood glucose is.  He denies any change in vision, chest pain, SOB, abdominal pain, blood in his stool, or changes in bowel habits.  Physical Exam:  Blood pressure 109/67, pulse 80, temperature 99 F (37.2 C), temperature source Oral, resp. rate 22, SpO2 99.00%.  General: Morbidly obese male, in no acute distress.  Head: Normocephalic and atraumatic.  Eyes: Pupils equal, round, and reactive to light, no injection and anicteric.  Mouth: Pharynx  pink and moist, no erythema or exudates.  Neck: Supple, full ROM.  Lungs: CTAB, normal respiratory effort, no accessory muscle use, no crackles, and no wheezes. Heart: Regular rate, regular rhythm, no murmur, no gallop, and no rub.  Abdomen: Obese. Soft, non-tender, non-distended, normal bowel sounds, no guarding, no rebound tenderness, no organomegaly.  Msk: No joint swelling, warmth, or erythema.  Extremities: 2+ radial and DP pulses bilaterally. 1-2+ pitting edema of BLE. RLE with erythema from ankle to 2/3+ up the shin toward the knee. TTP of RLE over erythematous area. Nontender in popliteal fossa, full ROM without pain.  Neurologic: Alert & oriented X3, cranial nerves II-XII intact, strength normal in all extremities  Skin: Eczema patch on left lateral shin.  Psych: Normal mood and affect  Lab results:  Basic Metabolic Panel:   Recent Labs   10/11/12 2041   NA  137   K  4.2   CL  100   CO2  28   GLUCOSE  173*   BUN  20   CREATININE  1.21   CALCIUM  9.1    Liver Function Tests:   Recent Labs   10/11/12 2041   AST  18   ALT  18   ALKPHOS  111   BILITOT  0.3   PROT  6.8   ALBUMIN  3.2*    CBC:   Recent Labs   10/11/12 2041   WBC  7.5   NEUTROABS  4.4   HGB  14.5   HCT  41.4   MCV  82.6   PLT  188    CBG:   Recent Labs   10/11/12 2049   GLUCAP  149*    Coagulation:   Recent Labs   10/11/12 2359   LABPROT  13.1   INR  1.00    Other results:  EKG: None on admission, no previous.   Hospital Course by problem list: 57yo M with PMH HTN, gout, DM II, previous RLE DVT in '06, treated with Lovenox/Coumadin x 82mo, and superficial thrombophlebitis in '13, presents to the ED with RLE edema, erythema, and tenderness since 10/09/12.   # RLE Cellulitis: RLE venous doppler ruled out DVT, though initially patient was treated empirically with heparin drip.  He was started on IV clindamycin during hospitalization, and subsequently transitioned to oral clindamycin  300mg  TID x 5d.  Printed script provided to patient, as he reported his pharmacy would be closed.  Of note, patient has h/o DVT in LLE after foot fracture in '06. Also with superficial thrombophlebitis last year in his LLE. He denied recent trauma to his leg, but he does have a h/o DM II and is on Metformin. Of note, he was written for norco during hospitalization, but did not require pain medication.  He remained afebrile and without leukocytosis during hospitalization.  # DM II: On Metformin at home, held during hospitalization, but restarted at discharge. A1c = 7 at discharge.  Further follow up outpatient.  # HTN: On Lisinopril-HCTZ and Lasix at home,  which were continued on admission and at discharge. BP well controlled on admission.   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 Pt states that he is concerned about impendig outbreak and requests Valtrex.  He was treated with one dose of Valtrex 500mg  po daily during hospitalization.  DVT PPx: Was on empiric heparin gtt for DVT treatment, which was discontinued at discharge.    Discharge Vitals:  BP 114/65  Pulse 72  Temp(Src) 98.1 F (36.7 C) (Oral)  Resp 16  Ht 6' (1.829 m)  Wt 378 lb (171.46 kg)  BMI 51.25 kg/m2  SpO2 97%  Discharge Labs:  Results for orders placed during the hospital encounter of 10/11/12 (from the past 24 hour(s))  CBC WITH DIFFERENTIAL     Status: None   Collection Time    10/11/12  8:41 PM      Result Value Range   WBC 7.5  4.0 - 10.5 K/uL   RBC 5.01  4.22 - 5.81 MIL/uL   Hemoglobin 14.5  13.0 - 17.0 g/dL   HCT 40.1  02.7 - 25.3 %   MCV 82.6  78.0 - 100.0 fL   MCH 28.9  26.0 - 34.0 pg   MCHC 35.0  30.0 - 36.0 g/dL   RDW 66.4  40.3 - 47.4 %   Platelets 188  150 - 400 K/uL   Neutrophils Relative 59  43 - 77 %   Neutro Abs 4.4  1.7 - 7.7 K/uL   Lymphocytes Relative 32  12 - 46 %   Lymphs Abs 2.4  0.7 - 4.0 K/uL   Monocytes  Relative 7  3 - 12 %   Monocytes Absolute 0.5  0.1 - 1.0 K/uL   Eosinophils Relative 2  0 - 5 %   Eosinophils Absolute 0.2  0.0 - 0.7 K/uL   Basophils Relative 0  0 - 1 %   Basophils Absolute 0.0  0.0 - 0.1 K/uL  COMPREHENSIVE METABOLIC PANEL     Status: Abnormal   Collection Time    10/11/12  8:41 PM      Result Value Range   Sodium 137  135 - 145 mEq/L   Potassium 4.2  3.5 - 5.1 mEq/L   Chloride 100  96 - 112 mEq/L   CO2 28  19 - 32 mEq/L   Glucose, Bld 173 (*) 70 - 99 mg/dL   BUN 20  6 - 23 mg/dL   Creatinine, Ser 2.59  0.50 - 1.35 mg/dL   Calcium 9.1  8.4 - 56.3 mg/dL   Total Protein 6.8  6.0 - 8.3 g/dL   Albumin 3.2 (*) 3.5 - 5.2 g/dL   AST 18  0 - 37 U/L   ALT 18  0 - 53 U/L   Alkaline Phosphatase 111  39 - 117 U/L   Total Bilirubin 0.3  0.3 - 1.2 mg/dL   GFR calc non Af Amer 64 (*) >90 mL/min   GFR calc Af Amer 75 (*) >90 mL/min  GLUCOSE, CAPILLARY     Status: Abnormal   Collection Time    10/11/12  8:49 PM      Result Value Range   Glucose-Capillary 149 (*) 70 - 99 mg/dL  PROTIME-INR     Status: None   Collection Time    10/11/12 11:59 PM      Result Value Range   Prothrombin Time 13.1  11.6 - 15.2 seconds   INR 1.00  0.00 - 1.49  GLUCOSE, CAPILLARY     Status: Abnormal   Collection Time    10/12/12  5:06 AM      Result Value Range   Glucose-Capillary 174 (*) 70 - 99 mg/dL   Comment 1 Documented in Chart     Comment 2 Notify RN    COMPREHENSIVE METABOLIC PANEL     Status: Abnormal   Collection Time    10/12/12  6:55 AM      Result Value Range   Sodium 138  135 - 145 mEq/L   Potassium 3.7  3.5 - 5.1 mEq/L   Chloride 102  96 - 112 mEq/L   CO2 26  19 - 32 mEq/L   Glucose, Bld 144 (*) 70 - 99 mg/dL   BUN 19  6 - 23 mg/dL   Creatinine, Ser 4.54  0.50 - 1.35 mg/dL   Calcium 8.7  8.4 - 09.8 mg/dL   Total Protein 6.1  6.0 - 8.3 g/dL   Albumin 2.9 (*) 3.5 - 5.2 g/dL   AST 12  0 - 37 U/L   ALT 14  0 - 53 U/L   Alkaline Phosphatase 86  39 - 117 U/L   Total  Bilirubin 0.3  0.3 - 1.2 mg/dL   GFR calc non Af Amer 67 (*) >90 mL/min   GFR calc Af Amer 78 (*) >90 mL/min  CBC     Status: Abnormal   Collection Time    10/12/12  6:55 AM      Result Value Range   WBC 6.2  4.0 - 10.5 K/uL   RBC 4.68  4.22 - 5.81 MIL/uL   Hemoglobin 13.2  13.0 - 17.0 g/dL   HCT 11.9 (*) 14.7 - 82.9 %   MCV 82.5  78.0 - 100.0 fL   MCH 28.2  26.0 - 34.0 pg   MCHC 34.2  30.0 - 36.0 g/dL   RDW 56.2  13.0 - 86.5 %   Platelets 186  150 - 400 K/uL  GLUCOSE, CAPILLARY     Status: Abnormal   Collection Time    10/12/12  7:59 AM      Result Value Range   Glucose-Capillary 115 (*) 70 - 99 mg/dL   Comment 1 Notify RN    HEPARIN LEVEL (UNFRACTIONATED)     Status: Abnormal   Collection Time    10/12/12 11:12 AM      Result Value Range   Heparin Unfractionated 0.11 (*) 0.30 - 0.70 IU/mL    Signed: Stacy Gardner 10/12/2012, 1:31 PM   Time Spent on Discharge: 25 minutes

## 2012-10-12 NOTE — H&P (Signed)
Hospital Admission Note Date: 10/12/2012  Patient name: Michael Martinez Medical record number: [id number] Date of birth: 1955/08/10 Age: 57 y.o. Gender: male PCP: No primary provider on file.  Medical Service: Internal Medicine Teaching Service   Attending physician:  Dr. Dalphine Handing    1st Contact: Dr. Lavena Bullion   Pager: [phone] 2nd Contact: Dr. Everardo Beals   Pager: [phone] After 5 pm or weekends: 1st Contact:      Pager: [phone] 2nd Contact:      Pager: [phone]  Chief Complaint: RLE edema and pain  History of Present Illness: 57yo M with PMH HTN, gout, DM II, previous LLE DVT in '06, treated with Lovenox/Coumadin x 47mo and superficial thrombophlebitis in '13, presents to the ED with RLE edema, erythema, and tenderness since Wednesday.  He states that he woke up early Wednesday morning with chills and diaphoresis. He took his temperature and it was 102F. The next day, he endorses general malaise, which subsided by Thursday. He states that since he was feeling better, he and his wife drove over to the next town. Prior to leaving, he noticed some tenderness in his right lower leg, but is unsure if there was any redness. He states that he does have chronic lower extremity edema and wears compression stockings. He did put them on on Thursday night but notices increased edema and redness of his RLE once he returned home and took off the stocking. He then elevated his foot, which he states did improved the redness and swelling, but on Friday, it returned. He decided to come to the ED for evaluation because of his previous DVT history. He denies any trauma to his leg and denies any recent sores or wounds. He does have a h/o DM II but states that he is not sure how well controlled his blood glucose is.  He denies any change in vision, chest pain, SOB, abdominal pain, blood in his stool, or changes in bowel habits.  Meds: Current Outpatient Rx  Name  Route  Sig  Dispense  Refill  . acetaminophen (TYLENOL) 325  MG tablet   Oral   Take 650 mg by mouth every 6 (six) hours as needed for pain.         . cetirizine (ZYRTEC) 10 MG tablet   Oral   Take 10 mg by mouth daily as needed for allergies.         . diazepam (VALIUM) 5 MG tablet   Oral   Take 5 mg by mouth every 8 (eight) hours as needed. For muscle spasm         . fish oil-omega-3 fatty acids 1000 MG capsule   Oral   Take 2 g by mouth daily.         . furosemide (LASIX) 20 MG tablet   Oral   Take 10 mg by mouth daily as needed (for fluid).          Marland Kitchen HYDROcodone-acetaminophen (VICODIN) 5-500 MG per tablet   Oral   Take 1 tablet by mouth every 4 (four) hours as needed. For pain.         Marland Kitchen ibuprofen (ADVIL,MOTRIN) 200 MG tablet   Oral   Take 200 mg by mouth every 6 (six) hours as needed for pain.         . indomethacin (INDOCIN) 25 MG capsule   Oral   Take 25 mg by mouth 3 (three) times daily as needed. For gout flare ups         .  lisinopril-hydrochlorothiazide (PRINZIDE,ZESTORETIC) 20-12.5 MG per tablet   Oral   Take 1 tablet by mouth daily.         Marland Kitchen lovastatin (MEVACOR) 40 MG tablet   Oral   Take 40 mg by mouth at bedtime.         . meloxicam (MOBIC) 7.5 MG tablet   Oral   Take 7.5 mg by mouth 2 (two) times daily.         . metFORMIN (GLUCOPHAGE) 500 MG tablet   Oral   Take 500 mg by mouth 2 (two) times daily with a meal.         . Multiple Vitamin (MULTIVITAMIN WITH MINERALS) TABS   Oral   Take 1 tablet by mouth daily.         . Phendimetrazine Tartrate 105 MG CP24   Oral   Take 1 capsule by mouth daily.         . valACYclovir (VALTREX) 500 MG tablet   Oral   Take 500 mg by mouth daily as needed (for fever blisters).         . vardenafil (LEVITRA) 20 MG tablet   Oral   Take 20 mg by mouth daily as needed for erectile dysfunction.           Allergies: Allergies as of 10/11/2012 - Review Complete 09/18/2011  Allergen Reaction Noted  . Penicillins  09/18/2011   Past  Medical History  Diagnosis Date  . DVT (deep venous thrombosis)   . Gout   . Obesity   . Hypertension   . Diabetes mellitus   . Hypercholesteremia   . CHF (congestive heart failure)    History reviewed. No pertinent past surgical history. No family history on file. History   Social History  . Marital Status: Married    Spouse Name: N/A    Number of Children: N/A  . Years of Education: N/A   Occupational History  . Not on file.   Social History Main Topics  . Smoking status: Never Smoker   . Smokeless tobacco: Not on file  . Alcohol Use: No  . Drug Use: No  . Sexually Active:    Other Topics Concern  . Not on file   Social History Narrative  . No narrative on file    Review of Systems: A 10 point ROS was performed; pertinent positives and negatives were noted in the HPI   Physical Exam: Blood pressure 109/67, pulse 80, temperature 99 F (37.2 C), temperature source Oral, resp. rate 22, SpO2 99.00%. General: Morbidly obese male, in no acute distress.  Head: Normocephalic and atraumatic.  Eyes: Pupils equal, round, and reactive to light, no injection and anicteric.  Mouth: Pharynx pink and moist, no erythema or exudates.  Neck: Supple, full ROM. Lungs: CTAB, normal respiratory effort, no accessory muscle use, no crackles, and no wheezes. Heart: Regular rate, regular rhythm, no murmur, no gallop, and no rub.  Abdomen: Obese. Soft, non-tender, non-distended, normal bowel sounds, no guarding, no rebound tenderness, no organomegaly.  Msk: No joint swelling, warmth, or erythema.  Extremities: 2+ radial and DP pulses bilaterally. 1-2+ pitting edema of BLE. RLE with erythema from ankle to 2/3+ up the shin toward the knee. TTP of RLE over erythematous area. Nontender in popliteal fossa, full ROM without pain.  Neurologic: Alert & oriented X3, cranial nerves II-XII intact, strength normal in all extremities Skin: Eczema patch on left lateral shin.  Psych: Normal mood and  affect   Lab results:  Basic Metabolic Panel:  Recent Labs  16/10/96 2041  NA 137  K 4.2  CL 100  CO2 28  GLUCOSE 173*  BUN 20  CREATININE 1.21  CALCIUM 9.1   Liver Function Tests:  Recent Labs  10/11/12 2041  AST 18  ALT 18  ALKPHOS 111  BILITOT 0.3  PROT 6.8  ALBUMIN 3.2*   CBC:  Recent Labs  10/11/12 2041  WBC 7.5  NEUTROABS 4.4  HGB 14.5  HCT 41.4  MCV 82.6  PLT 188   CBG:  Recent Labs  10/11/12 2049  GLUCAP 149*   Coagulation:  Recent Labs  10/11/12 2359  LABPROT 13.1  INR 1.00    Other results: EKG: None on admission, no previous.  Assessment & Plan by Problem: 57yo M with PMH HTN, gout, DM II, previous RLE DVT in '06, treated with Lovenox/Coumadin x 1mo, and superficial thrombophlebitis in '13, presents to the ED with RLE edema, erythema, and tenderness since Wednesday.   1. RLE Cellulitis vs DVT: Pt with h/o DVT in LLE after foot fracture in '06. Also with superficial thrombophlebitis last year in his LLE. He denies any trauma to his leg, but he does have a h/o DM II and is on Metformin. He does not check his CBGs regularly but his blood glucose was 173 on admission. On exam, his leg appears more like cellulitis, but he is currently afebrile, despite his previously reported fever at home on Wed., and he has no leukocytosis. Given his h/o DVT, at this time, we cannot r/o DVT.  - Admit to IMTS for observation - Clindamycin for possible cellulitis - Heparin drip for possible DVT - Right lower extremity venous duplex - Carb modified diet - Vicodin 5-325mg  1 tablet po q4h PRN pian - Zofran 4mg  q6h PRN nausea - AM CBC  DM II: On Metformin at home. Does not check CBGs. Blood glucose 179 on admission. Holding Metformin. - SSI   HTN: On Lisinopril-HCTZ and Lasix at home, which were continued on admission. BP well controlled on admission. 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 Pt states that he is concerned about impendig outbreak and requests Valtrex. - Valtrex 500mg  po daily  DVT PPx: On heparin drip for possible DVT, SCDs  Dispo: Disposition is deferred at this time, awaiting improvement of current medical problems. Anticipated discharge in approximately 1-2 day(s).   The patient possibly has a current PCP (No primary provider on file.), therefore might not be requiring OPC follow-up after discharge.   The patient does not have transportation limitations that hinder transportation to clinic appointments.  Signed: Genelle Gather 10/12/2012, 1:43 AM

## 2012-10-12 NOTE — Progress Notes (Signed)
ANTICOAGULATION CONSULT NOTE - Initial Consult  Pharmacy Consult for heparin Indication: r/o VTE  Allergies  Allergen Reactions  . Penicillins     Childhood allergy    Patient Measurements:   Heparin Dosing Weight: 90 kg  Vital Signs: Temp: 99 F (37.2 C) (03/28 2348) Temp src: Oral (03/28 2037) BP: 117/55 mmHg (03/29 0201) Pulse Rate: 79 (03/29 0201)  Labs:  Recent Labs  10/11/12 2041 10/11/12 2359  HGB 14.5  --   HCT 41.4  --   PLT 188  --   LABPROT  --  13.1  INR  --  1.00  CREATININE 1.21  --     CrCl is unknown because there is no height on file for the current visit.   Medical History: Past Medical History  Diagnosis Date  . DVT (deep venous thrombosis)   . Gout   . Obesity   . Hypertension   . Diabetes mellitus   . Hypercholesteremia   . CHF (congestive heart failure)     Medications:   (Not in a hospital admission)  Assessment: 57 yo man with previous h/o DVT to start heparin for r/o DVT. Goal of Therapy:  Heparin level 0.3-0.7 units/ml Monitor platelets by anticoagulation protocol: Yes   Plan:  Heparin bolus 4000 units and drip at 1500 units/hr Check heparin level 8 hours after bolus. Daily HL and CBC while on heparin.  Talbert Cage Poteet 10/12/2012,3:14 AM

## 2012-10-12 NOTE — Progress Notes (Signed)
VASCULAR LAB PRELIMINARY  PRELIMINARY  PRELIMINARY  PRELIMINARY  Right lower extremity venous Doppler completed.    Preliminary report:  There is no DVT or SVT noted in the right lower extremity.  Michai Dieppa, RVT 10/12/2012, 12:06 PM

## 2012-10-12 NOTE — Progress Notes (Signed)
ANTICOAGULATION CONSULT NOTE - Follow Up  Pharmacy Consult for heparin Indication: r/o VTE  Allergies  Allergen Reactions  . Penicillins     Childhood allergy   Patient Measurements: Height: 6' (182.9 cm) Weight: 378 lb (171.46 kg) IBW/kg (Calculated) : 77.6 Heparin Dosing Weight: 90 kg  Vital Signs: Temp: 98.1 F (36.7 C) (03/29 0503) Temp src: Oral (03/29 0503) BP: 114/65 mmHg (03/29 0503) Pulse Rate: 72 (03/29 0615)  Labs:  Recent Labs  10/11/12 2041 10/11/12 2359 10/12/12 0655 10/12/12 1112  HGB 14.5  --  13.2  --   HCT 41.4  --  38.6*  --   PLT 188  --  186  --   LABPROT  --  13.1  --   --   INR  --  1.00  --   --   HEPARINUNFRC  --   --   --  0.11*  CREATININE 1.21  --  1.17  --    Estimated Creatinine Clearance: 112.1 ml/min (by C-G formula based on Cr of 1.17).  Medical History: Past Medical History  Diagnosis Date  . DVT (deep venous thrombosis)   . Gout   . Obesity   . Hypertension   . Diabetes mellitus   . Hypercholesteremia   . CHF (congestive heart failure)    Medications:  Prescriptions prior to admission  Medication Sig Dispense Refill  . acetaminophen (TYLENOL) 325 MG tablet Take 650 mg by mouth every 6 (six) hours as needed for pain.      . cetirizine (ZYRTEC) 10 MG tablet Take 10 mg by mouth daily as needed for allergies.      . diazepam (VALIUM) 5 MG tablet Take 5 mg by mouth every 8 (eight) hours as needed. For muscle spasm      . fish oil-omega-3 fatty acids 1000 MG capsule Take 2 g by mouth daily.      . furosemide (LASIX) 20 MG tablet Take 10 mg by mouth daily as needed (for fluid).       Marland Kitchen HYDROcodone-acetaminophen (VICODIN) 5-500 MG per tablet Take 1 tablet by mouth every 4 (four) hours as needed. For pain.      Marland Kitchen ibuprofen (ADVIL,MOTRIN) 200 MG tablet Take 200 mg by mouth every 6 (six) hours as needed for pain.      . indomethacin (INDOCIN) 25 MG capsule Take 25 mg by mouth 3 (three) times daily as needed. For gout flare ups       . lisinopril-hydrochlorothiazide (PRINZIDE,ZESTORETIC) 20-12.5 MG per tablet Take 1 tablet by mouth daily.      Marland Kitchen lovastatin (MEVACOR) 40 MG tablet Take 40 mg by mouth at bedtime.      . meloxicam (MOBIC) 7.5 MG tablet Take 7.5 mg by mouth 2 (two) times daily.      . metFORMIN (GLUCOPHAGE) 500 MG tablet Take 500 mg by mouth 2 (two) times daily with a meal.      . Multiple Vitamin (MULTIVITAMIN WITH MINERALS) TABS Take 1 tablet by mouth daily.      . Phendimetrazine Tartrate 105 MG CP24 Take 1 capsule by mouth daily.      . valACYclovir (VALTREX) 500 MG tablet Take 500 mg by mouth daily as needed (for fever blisters).      . vardenafil (LEVITRA) 20 MG tablet Take 20 mg by mouth daily as needed for erectile dysfunction.       Assessment: 57 yo man with previous h/o DVT to start heparin for r/o DVT.  Dopplers  are negative, RN is contacting MD to discuss ongoing therapy.  Goal of Therapy:  Heparin level 0.3-0.7 units/ml Monitor platelets by anticoagulation protocol: Yes   Plan:  Heparin drip to 1800 units/hr Check heparin level 8 hours after rate change unless therapy is stopped. Daily HL and CBC while on heparin.  Nadara Mustard, PharmD., MS Clinical Pharmacist Pager:  [phone] Thank you for allowing pharmacy to be part of this patients care team. 10/12/2012,12:17 PM

## 2012-10-13 NOTE — Discharge Summary (Signed)
Internal Medicine Teaching Service Attending Note Date: 10/13/2012  Patient name: Michael Martinez  Medical record number: [id number]  Date of birth: 07/26/1955    I evaluated the patient on the day of discharge and discussed the discharge plan with my resident team. I agree with the discharge documentation and disposition.  Thank you for working with me on this patient's care.   Thanks Aletta Edouard 10/13/2012, 2:11 PM

## 2012-10-14 LAB — GLUCOSE, CAPILLARY

## 2012-10-18 LAB — CULTURE, BLOOD (ROUTINE X 2): Culture: NO GROWTH

## 2013-03-09 ENCOUNTER — Emergency Department (HOSPITAL_COMMUNITY)
Admission: EM | Admit: 2013-03-09 | Discharge: 2013-03-09 | Disposition: A | Discharge status: Home / Self Care | Payer: Self-pay | Attending: Emergency Medicine | Admitting: Emergency Medicine

## 2013-03-09 ENCOUNTER — Encounter (HOSPITAL_COMMUNITY): Payer: Self-pay | Admitting: *Deleted

## 2013-03-09 DIAGNOSIS — I509 Heart failure, unspecified: Secondary | ICD-10-CM | POA: Insufficient documentation

## 2013-03-09 DIAGNOSIS — E669 Obesity, unspecified: Secondary | ICD-10-CM | POA: Insufficient documentation

## 2013-03-09 DIAGNOSIS — L02419 Cutaneous abscess of limb, unspecified: Secondary | ICD-10-CM | POA: Insufficient documentation

## 2013-03-09 DIAGNOSIS — E7800 Pure hypercholesterolemia, unspecified: Secondary | ICD-10-CM | POA: Insufficient documentation

## 2013-03-09 DIAGNOSIS — R21 Rash and other nonspecific skin eruption: Secondary | ICD-10-CM | POA: Insufficient documentation

## 2013-03-09 DIAGNOSIS — Z86718 Personal history of other venous thrombosis and embolism: Secondary | ICD-10-CM | POA: Insufficient documentation

## 2013-03-09 DIAGNOSIS — I10 Essential (primary) hypertension: Secondary | ICD-10-CM | POA: Insufficient documentation

## 2013-03-09 DIAGNOSIS — Z79899 Other long term (current) drug therapy: Secondary | ICD-10-CM | POA: Insufficient documentation

## 2013-03-09 DIAGNOSIS — M7989 Other specified soft tissue disorders: Secondary | ICD-10-CM

## 2013-03-09 DIAGNOSIS — Z880 Allergy status to penicillin: Secondary | ICD-10-CM | POA: Insufficient documentation

## 2013-03-09 DIAGNOSIS — R609 Edema, unspecified: Secondary | ICD-10-CM | POA: Insufficient documentation

## 2013-03-09 DIAGNOSIS — M79609 Pain in unspecified limb: Secondary | ICD-10-CM

## 2013-03-09 DIAGNOSIS — M109 Gout, unspecified: Secondary | ICD-10-CM | POA: Insufficient documentation

## 2013-03-09 DIAGNOSIS — L539 Erythematous condition, unspecified: Secondary | ICD-10-CM | POA: Insufficient documentation

## 2013-03-09 DIAGNOSIS — Z792 Long term (current) use of antibiotics: Secondary | ICD-10-CM | POA: Insufficient documentation

## 2013-03-09 DIAGNOSIS — E119 Type 2 diabetes mellitus without complications: Secondary | ICD-10-CM | POA: Insufficient documentation

## 2013-03-09 DIAGNOSIS — L0390 Cellulitis, unspecified: Secondary | ICD-10-CM

## 2013-03-09 MED ORDER — CLINDAMYCIN HCL 150 MG PO CAPS: 300.0000 mg | ORAL_CAPSULE | Freq: Three times a day (TID) | ORAL | Status: DC

## 2013-03-09 MED ORDER — CLINDAMYCIN HCL 150 MG PO CAPS: 300.0000 mg | ORAL_CAPSULE | Freq: Three times a day (TID) | ORAL | Status: AC

## 2013-03-09 MED ORDER — CLINDAMYCIN PHOSPHATE 600 MG/50ML IV SOLN
600.0000 mg | Freq: Once | INTRAVENOUS | Status: AC
  Administered 2013-03-09: 600 mg via INTRAVENOUS
  Filled 2013-03-09: qty 50

## 2013-03-09 MED ORDER — SODIUM CHLORIDE 0.9 % IV BOLUS (SEPSIS)
500.0000 mL | Freq: Once | INTRAVENOUS | Status: AC
  Administered 2013-03-09: 500 mL via INTRAVENOUS

## 2013-03-09 NOTE — ED Notes (Signed)
Dr. Bednar at bedside. 

## 2013-03-09 NOTE — ED Notes (Signed)
Doppler study of RLE at bedside

## 2013-03-09 NOTE — ED Notes (Signed)
PT states yesterday started having chills and fever 101.4.  Pt has swelling redness and warmth to right leg.  Pt has history of blood clots.  Pulse present

## 2013-03-09 NOTE — ED Notes (Signed)
Pt reports had fever, chills yesterday, none today. This afternoon noticed RLE below the knee edematous, very red & hot to touch. Denies any pain, SOB, CP.

## 2013-03-09 NOTE — ED Provider Notes (Signed)
I saw and evaluated the patient, reviewed the resident's note and I agree with the findings and plan.  Localized tenderness right lower leg with erythema warmth mild tenderness distal right foot normal light touch capillary refill less than 2 seconds her cells pedis pulse intact good movement.  Hurman Horn, MD 03/11/13 2105

## 2013-03-09 NOTE — ED Provider Notes (Signed)
CSN: [id number]     Arrival date & time 03/09/13  1725 History     First MD Initiated Contact with Patient 03/09/13 1808     No chief complaint on file.  (Consider location/radiation/quality/duration/timing/severity/associated sxs/prior Treatment) HPI 57 year old male with a history of obesity, hypertension, diabetes, hypercholesterolemia, CHF, DVT, right lower extremity cellulitis presents with right leg erythema and swelling. Patient reports yesterday he had chills and a fever of 101.4. Reports the fever dissipated, however he noticed this morning that his right lower extremity was erythematous and swollen. Notes very minimal pain rated 1/10. Took one 500 mg clindamycin at 3 PM.  Reports history of left lower extremity DVT in the past.  Denies any current fevers, chest pain, shortness of breath.  Denies any fevers or chills today, the and to take ibuprofen at 3 PM prior to presentation secondary to pain.  Past Medical History  Diagnosis Date  . DVT (deep venous thrombosis)   . Gout   . Obesity   . Hypertension   . Diabetes mellitus   . Hypercholesteremia   . CHF (congestive heart failure)    Past Surgical History  Procedure Laterality Date  . Hernia repair     No family history on file. History  Substance Use Topics  . Smoking status: Never Smoker   . Smokeless tobacco: Not on file  . Alcohol Use: No    Review of Systems  Constitutional: Negative for fever (yesterday, none today) and chills.  HENT: Negative for sore throat and neck stiffness.   Eyes: Negative for visual disturbance.  Respiratory: Negative for shortness of breath.   Cardiovascular: Positive for leg swelling. Negative for chest pain.  Gastrointestinal: Negative for nausea, vomiting and abdominal pain.  Genitourinary: Negative for difficulty urinating.  Musculoskeletal: Negative for back pain.  Skin: Positive for rash. Negative for wound.  Neurological: Negative for syncope and headaches.    Allergies   Penicillins  Home Medications   Current Outpatient Rx  Name  Route  Sig  Dispense  Refill  . acetaminophen (TYLENOL) 325 MG tablet   Oral   Take 650 mg by mouth every 6 (six) hours as needed for pain.         . cetirizine (ZYRTEC) 10 MG tablet   Oral   Take 10 mg by mouth daily as needed for allergies.         . clindamycin (CLEOCIN) 300 MG capsule   Oral   Take 300 mg by mouth 3 (three) times daily.         . diazepam (VALIUM) 5 MG tablet   Oral   Take 5 mg by mouth every 8 (eight) hours as needed. For muscle spasm         . furosemide (LASIX) 20 MG tablet   Oral   Take 10 mg by mouth daily as needed (for fluid).          Marland Kitchen HYDROcodone-acetaminophen (VICODIN) 5-500 MG per tablet   Oral   Take 1 tablet by mouth every 4 (four) hours as needed. For pain.         Marland Kitchen ibuprofen (ADVIL,MOTRIN) 200 MG tablet   Oral   Take 200 mg by mouth every 6 (six) hours as needed for pain.         . indomethacin (INDOCIN) 25 MG capsule   Oral   Take 25 mg by mouth 3 (three) times daily as needed. For gout flare ups         .  lovastatin (MEVACOR) 40 MG tablet   Oral   Take 40 mg by mouth at bedtime.         . meloxicam (MOBIC) 7.5 MG tablet   Oral   Take 7.5 mg by mouth 2 (two) times daily.         . metFORMIN (GLUCOPHAGE) 500 MG tablet   Oral   Take 500 mg by mouth 2 (two) times daily with a meal.         . Phendimetrazine Tartrate 105 MG CP24   Oral   Take 1 capsule by mouth daily.         . valACYclovir (VALTREX) 500 MG tablet   Oral   Take 500 mg by mouth daily as needed (for fever blisters).         . vardenafil (LEVITRA) 20 MG tablet   Oral   Take 20 mg by mouth daily as needed for erectile dysfunction.         . clindamycin (CLEOCIN) 150 MG capsule   Oral   Take 2 capsules (300 mg total) by mouth 3 (three) times daily.   60 capsule   0    BP 115/68  Pulse 86  Temp(Src) 98.2 F (36.8 C) (Oral)  Resp 22  SpO2 100% Physical Exam   Nursing note and vitals reviewed. Constitutional: He is oriented to person, place, and time. He appears well-developed and well-nourished. No distress.  HENT:  Head: Normocephalic and atraumatic.  Eyes: Conjunctivae and EOM are normal.  Neck: Normal range of motion.  Cardiovascular: Normal rate, regular rhythm, normal heart sounds and intact distal pulses.  Exam reveals no gallop and no friction rub.   No murmur heard. Pulmonary/Chest: Effort normal and breath sounds normal. No respiratory distress. He has no wheezes. He has no rales.  Abdominal: Soft. He exhibits no distension. There is no tenderness. There is no guarding.  Musculoskeletal: He exhibits edema (bilateral 1+, right greater than left) and tenderness (right LE).  Neurological: He is alert and oriented to person, place, and time.  Skin: Skin is warm and dry. Rash (erythema RLE, 10cm circumferential) noted. He is not diaphoretic.    ED Course   Procedures (including critical care time)  Labs Reviewed - No data to display No results found. 1. Cellulitis     MDM  57 year old male with a history of obesity, hypertension, diabetes, hypercholesterolemia, CHF, DVT, right lower extremity cellulitis presents with right leg erythema and swelling. Given history of prior DVT, lower extremity Dopplers performed by vascular and showed no sign of DVT. Patient with likely cellulitis based on exam. Patient afebrile today without systemic symptoms and stable vital signs.  Patient given dose of IV clindamycin 600mg  while in the emergency department, and discharged with a prescription of 300 mg 3 times a day. Patient will followup with his primary care physician on Wednesday and has an appointment scheduled.  Given strict return precautions if cellulitis not responding to outpatient therapy.  Patient discharged in stable condition with understanding of reasons to return.  Rhae Lerner, MD 03/10/13 [phone]

## 2013-03-09 NOTE — Progress Notes (Signed)
VASCULAR LAB PRELIMINARY  PRELIMINARY  PRELIMINARY  PRELIMINARY  Right lower extremity venous Doppler completed.    Preliminary report:  There is no DVT or SVT noted in the right lower extremity.  Diar Berkel, RVT 03/09/2013, 6:42 PM

## 2013-05-07 ENCOUNTER — Emergency Department (HOSPITAL_COMMUNITY)
Admission: EM | Admit: 2013-05-07 | Discharge: 2013-05-07 | Disposition: A | Discharge status: Home / Self Care | Payer: Self-pay | Attending: Emergency Medicine | Admitting: Emergency Medicine

## 2013-05-07 ENCOUNTER — Emergency Department (HOSPITAL_COMMUNITY): Payer: Self-pay

## 2013-05-07 ENCOUNTER — Encounter (HOSPITAL_COMMUNITY): Payer: Self-pay | Admitting: Emergency Medicine

## 2013-05-07 DIAGNOSIS — I10 Essential (primary) hypertension: Secondary | ICD-10-CM | POA: Insufficient documentation

## 2013-05-07 DIAGNOSIS — Z791 Long term (current) use of non-steroidal anti-inflammatories (NSAID): Secondary | ICD-10-CM | POA: Insufficient documentation

## 2013-05-07 DIAGNOSIS — R609 Edema, unspecified: Secondary | ICD-10-CM | POA: Insufficient documentation

## 2013-05-07 DIAGNOSIS — E669 Obesity, unspecified: Secondary | ICD-10-CM | POA: Insufficient documentation

## 2013-05-07 DIAGNOSIS — Z9889 Other specified postprocedural states: Secondary | ICD-10-CM | POA: Insufficient documentation

## 2013-05-07 DIAGNOSIS — E119 Type 2 diabetes mellitus without complications: Secondary | ICD-10-CM | POA: Insufficient documentation

## 2013-05-07 DIAGNOSIS — M109 Gout, unspecified: Secondary | ICD-10-CM | POA: Insufficient documentation

## 2013-05-07 DIAGNOSIS — Z79899 Other long term (current) drug therapy: Secondary | ICD-10-CM | POA: Insufficient documentation

## 2013-05-07 DIAGNOSIS — R0989 Other specified symptoms and signs involving the circulatory and respiratory systems: Secondary | ICD-10-CM | POA: Insufficient documentation

## 2013-05-07 DIAGNOSIS — R0600 Dyspnea, unspecified: Secondary | ICD-10-CM

## 2013-05-07 DIAGNOSIS — I509 Heart failure, unspecified: Secondary | ICD-10-CM | POA: Insufficient documentation

## 2013-05-07 DIAGNOSIS — R0609 Other forms of dyspnea: Secondary | ICD-10-CM | POA: Insufficient documentation

## 2013-05-07 DIAGNOSIS — Z86718 Personal history of other venous thrombosis and embolism: Secondary | ICD-10-CM | POA: Insufficient documentation

## 2013-05-07 DIAGNOSIS — G4733 Obstructive sleep apnea (adult) (pediatric): Secondary | ICD-10-CM | POA: Insufficient documentation

## 2013-05-07 DIAGNOSIS — E7800 Pure hypercholesterolemia, unspecified: Secondary | ICD-10-CM | POA: Insufficient documentation

## 2013-05-07 DIAGNOSIS — Z9981 Dependence on supplemental oxygen: Secondary | ICD-10-CM | POA: Insufficient documentation

## 2013-05-07 HISTORY — DX: Dependence on other enabling machines and devices: Z99.89

## 2013-05-07 HISTORY — DX: Edema, unspecified: R60.9

## 2013-05-07 HISTORY — DX: Obstructive sleep apnea (adult) (pediatric): G47.33

## 2013-05-07 LAB — COMPREHENSIVE METABOLIC PANEL
ALT: 23 U/L (ref 0–53)
AST: 19 U/L (ref 0–37)
Albumin: 3.1 g/dL — ABNORMAL LOW (ref 3.5–5.2)
Calcium: 8.9 mg/dL (ref 8.4–10.5)
Sodium: 138 mEq/L (ref 135–145)
Total Protein: 6.1 g/dL (ref 6.0–8.3)

## 2013-05-07 LAB — CBC WITH DIFFERENTIAL/PLATELET
Basophils Relative: 1 % (ref 0–1)
Eosinophils Absolute: 0.3 10*3/uL (ref 0.0–0.7)
HCT: 39.5 % (ref 39.0–52.0)
Hemoglobin: 13.3 g/dL (ref 13.0–17.0)
MCH: 28.9 pg (ref 26.0–34.0)
MCHC: 33.7 g/dL (ref 30.0–36.0)
MCV: 85.9 fL (ref 78.0–100.0)
Monocytes Absolute: 0.4 10*3/uL (ref 0.1–1.0)
Monocytes Relative: 7 % (ref 3–12)
Neutro Abs: 2.9 10*3/uL (ref 1.7–7.7)

## 2013-05-07 LAB — D-DIMER, QUANTITATIVE: D-Dimer, Quant: 1.08 ug{FEU}/mL — ABNORMAL HIGH (ref 0.00–0.48)

## 2013-05-07 LAB — POCT I-STAT TROPONIN I: Troponin i, poc: 0.01 ng/mL (ref 0.00–0.08)

## 2013-05-07 LAB — PRO B NATRIURETIC PEPTIDE: Pro B Natriuretic peptide (BNP): 633.5 pg/mL — ABNORMAL HIGH (ref 0–125)

## 2013-05-07 MED ORDER — IOHEXOL 350 MG/ML SOLN
100.0000 mL | Freq: Once | INTRAVENOUS | Status: AC | PRN
  Administered 2013-05-07: 100 mL via INTRAVENOUS

## 2013-05-07 NOTE — ED Notes (Signed)
Pt's  Pulse-ox while ambulating is 95%--96%.RN notified.

## 2013-05-07 NOTE — ED Provider Notes (Signed)
CSN: [id number]     Arrival date & time 05/07/13  8469 History   First MD Initiated Contact with Patient 05/07/13 1032     Chief Complaint  Patient presents with  . Shortness of Breath    HPI Pt was seen at 1050. Per pt and his wife, c/o gradual onset and persistence of constant SOB and bilateral pedal edema for "a long time," especially over the past 2+ months. Pt states he has been evaluated by his PMD for same, rx lasix. Admits he does not take his lasix regularly nor at the prescribed dose because "I don't like having to go to the bathroom all day."  Denies CP/palpitations, no cough, no abd pain, no N/V/D, no back pain, no fevers, no calf/LE pain or unilateral swelling.   Past Medical History  Diagnosis Date  . DVT (deep venous thrombosis) 2006    "after ankle surgery"  . Gout   . Obesity   . Hypertension   . Diabetes mellitus   . Hypercholesteremia   . CHF (congestive heart failure)   . OSA on CPAP   . Peripheral edema     chronic   Past Surgical History  Procedure Laterality Date  . Hernia repair      History  Substance Use Topics  . Smoking status: Never Smoker   . Smokeless tobacco: Not on file  . Alcohol Use: No    Review of Systems ROS: Statement: All systems negative except as marked or noted in the HPI; Constitutional: Negative for fever and chills. ; ; Eyes: Negative for eye pain, redness and discharge. ; ; ENMT: Negative for ear pain, hoarseness, nasal congestion, sinus pressure and sore throat. ; ; Cardiovascular: Negative for chest pain, palpitations, diaphoresis, +dyspnea and peripheral edema. ; ; Respiratory: Negative for cough, wheezing and stridor. ; ; Gastrointestinal: Negative for nausea, vomiting, diarrhea, abdominal pain, blood in stool, hematemesis, jaundice and rectal bleeding. . ; ; Genitourinary: Negative for dysuria, flank pain and hematuria. ; ; Musculoskeletal: Negative for back pain and neck pain. Negative for swelling and trauma.; ; Skin:  Negative for pruritus, rash, abrasions, blisters, bruising and skin lesion.; ; Neuro: Negative for headache, lightheadedness and neck stiffness. Negative for weakness, altered level of consciousness , altered mental status, extremity weakness, paresthesias, involuntary movement, seizure and syncope.       Allergies  Penicillins  Home Medications   Current Outpatient Rx  Name  Route  Sig  Dispense  Refill  . furosemide (LASIX) 20 MG tablet   Oral   Take 10 mg by mouth 2 (two) times daily.          Marland Kitchen HYDROcodone-acetaminophen (NORCO/VICODIN) 5-325 MG per tablet   Oral   Take 1 tablet by mouth every 8 (eight) hours as needed for pain.         . indomethacin (INDOCIN) 25 MG capsule   Oral   Take 25 mg by mouth 3 (three) times daily as needed. For gout flare ups         . losartan (COZAAR) 100 MG tablet   Oral   Take 100 mg by mouth daily.         Marland Kitchen lovastatin (MEVACOR) 40 MG tablet   Oral   Take 40 mg by mouth at bedtime.         . meloxicam (MOBIC) 7.5 MG tablet   Oral   Take 7.5 mg by mouth 2 (two) times daily.         Marland Kitchen  metFORMIN (GLUCOPHAGE) 500 MG tablet   Oral   Take 500 mg by mouth 2 (two) times daily with a meal.         . vardenafil (LEVITRA) 20 MG tablet   Oral   Take 20 mg by mouth daily as needed for erectile dysfunction.         Marland Kitchen acetaminophen (TYLENOL) 325 MG tablet   Oral   Take 650 mg by mouth every 6 (six) hours as needed for pain.         . diazepam (VALIUM) 5 MG tablet   Oral   Take 5 mg by mouth every 8 (eight) hours as needed. For muscle spasm         . ibuprofen (ADVIL,MOTRIN) 200 MG tablet   Oral   Take 200 mg by mouth every 6 (six) hours as needed for pain.         Marland Kitchen Phendimetrazine Tartrate 105 MG CP24   Oral   Take 1 capsule by mouth daily.         . valACYclovir (VALTREX) 500 MG tablet   Oral   Take 500 mg by mouth as needed (for fever blisters).           BP 142/91  Pulse 82  Temp(Src) 98.8 F (37.1  C) (Oral)  Resp 20  Ht 6' (1.829 m)  Wt 419 lb 9 oz (190.312 kg)  BMI 56.89 kg/m2  SpO2 97% Physical Exam 1055: Physical examination:  Nursing notes reviewed; Vital signs and O2 SAT reviewed;  Constitutional: Well developed, Well nourished, Well hydrated, In no acute distress; Head:  Normocephalic, atraumatic; Eyes: EOMI, PERRL, No scleral icterus; ENMT: Mouth and pharynx normal, Mucous membranes moist; Neck: Supple, Full range of motion, No lymphadenopathy; Cardiovascular: Regular rate and rhythm, No gallop; Respiratory: Breath sounds diminished & equal bilaterally, No wheezes.  Speaking full sentences with ease, Normal respiratory effort/excursion; Chest: Nontender, Movement normal; Abdomen: Soft, Nontender, Nondistended, Normal bowel sounds; Genitourinary: No CVA tenderness; Extremities: Pulses normal, No tenderness, +2 pedal edema bilat without calf tenderness or asymmetry.; Neuro: AA&Ox3, Major CN grossly intact.  Speech clear. No gross focal motor or sensory deficits in extremities.; Skin: Color normal, Warm, Dry.   ED Course  Procedures    EKG Interpretation     Ventricular Rate:  88 PR Interval:  182 QRS Duration: 98 QT Interval:  348 QTC Calculation: 421 R Axis:   6 Text Interpretation:  Normal sinus rhythm Low voltage QRS Septal infarct , age undetermined Baseline wander No old tracing to compare            MDM  MDM Reviewed: previous chart, nursing note and vitals Reviewed previous: ECG and labs Interpretation: x-ray, labs, ECG and CT scan     Results for orders placed during the hospital encounter of 05/07/13  COMPREHENSIVE METABOLIC PANEL      Result Value Range   Sodium 138  135 - 145 mEq/L   Potassium 4.5  3.5 - 5.1 mEq/L   Chloride 102  96 - 112 mEq/L   CO2 26  19 - 32 mEq/L   Glucose, Bld 220 (*) 70 - 99 mg/dL   BUN 17  6 - 23 mg/dL   Creatinine, Ser 4.54  0.50 - 1.35 mg/dL   Calcium 8.9  8.4 - 09.8 mg/dL   Total Protein 6.1  6.0 - 8.3 g/dL    Albumin 3.1 (*) 3.5 - 5.2 g/dL   AST 19  0 - 37 U/L  ALT 23  0 - 53 U/L   Alkaline Phosphatase 101  39 - 117 U/L   Total Bilirubin 0.4  0.3 - 1.2 mg/dL   GFR calc non Af Amer 80 (*) >90 mL/min   GFR calc Af Amer >90  >90 mL/min  CBC WITH DIFFERENTIAL      Result Value Range   WBC 5.3  4.0 - 10.5 K/uL   RBC 4.60  4.22 - 5.81 MIL/uL   Hemoglobin 13.3  13.0 - 17.0 g/dL   HCT 45.4  09.8 - 11.9 %   MCV 85.9  78.0 - 100.0 fL   MCH 28.9  26.0 - 34.0 pg   MCHC 33.7  30.0 - 36.0 g/dL   RDW 14.7  82.9 - 56.2 %   Platelets 189  150 - 400 K/uL   Neutrophils Relative % 54  43 - 77 %   Neutro Abs 2.9  1.7 - 7.7 K/uL   Lymphocytes Relative 32  12 - 46 %   Lymphs Abs 1.7  0.7 - 4.0 K/uL   Monocytes Relative 7  3 - 12 %   Monocytes Absolute 0.4  0.1 - 1.0 K/uL   Eosinophils Relative 6 (*) 0 - 5 %   Eosinophils Absolute 0.3  0.0 - 0.7 K/uL   Basophils Relative 1  0 - 1 %   Basophils Absolute 0.0  0.0 - 0.1 K/uL  D-DIMER, QUANTITATIVE      Result Value Range   D-Dimer, Quant 1.08 (*) 0.00 - 0.48 ug/mL-FEU  PRO B NATRIURETIC PEPTIDE      Result Value Range   Pro B Natriuretic peptide (BNP) 633.5 (*) 0 - 125 pg/mL  POCT I-STAT TROPONIN I      Result Value Range   Troponin i, poc 0.01  0.00 - 0.08 ng/mL   Comment 3            Dg Chest 2 View 05/07/2013   CLINICAL DATA:  Shortness of breath.  EXAM: CHEST  2 VIEW  COMPARISON:  Single view of the chest 10/19/2005.  FINDINGS: Heart size and mediastinal contours are within normal limits. Both lungs are clear. Visualized skeletal structures are unremarkable.  IMPRESSION: No active cardiopulmonary disease.   Electronically Signed   By: Drusilla Kanner M.D.   On: 05/07/2013 11:03   Ct Angio Chest Pe W/cm &/or Wo Cm 05/07/2013   CLINICAL DATA:  Shortness of breath.  EXAM: CT ANGIOGRAPHY CHEST WITH CONTRAST  TECHNIQUE: Multidetector CT imaging of the chest was performed using the standard protocol during bolus administration of intravenous contrast.  Multiplanar CT image reconstructions including MIPs were obtained to evaluate the vascular anatomy.  CONTRAST:  OMNIPAQUE IOHEXOL 350 MG/ML SOLN  COMPARISON:  Plain film of the chest earlier this same day and 10/19/2005.  FINDINGS: No pulmonary embolus is identified. Heart size is mildly enlarged. No axillary, hilar or mediastinal lymphadenopathy. The lungs are clear. Incidentally imaged upper abdomen demonstrates fatty infiltration of the liver. No focal bony abnormality is identified.  Review of the MIP images confirms the above findings.  IMPRESSION: Negative for pulmonary embolus or acute cardiopulmonary disease.  Fatty infiltration of the liver.   Electronically Signed   By: Drusilla Kanner M.D.   On: 05/07/2013 14:36     1605:  Pt ambulated around the ED with Sats remaining 95-96% R/A, denied CP or SOB. VS remain stable, resps easy. Pro-BNP elevated, no old to compare, but no acute CHF on XR or CT  scan. Doubt DVT as cause for bilat pedal edema, LE's today are NT to palp without unilateral swelling/asymmetry, and pt denies LE's pain or unilateral swelling at home. Pt reminded of need to take his lasix regularly, and as prescribed. Verb understanding. Pt states he wants to go home now. Dx and testing d/w pt and family.  Questions answered.  Verb understanding, agreeable to d/c home with outpt f/u.      Laray Anger, DO 05/10/13 1226

## 2013-05-07 NOTE — ED Notes (Signed)
Sob x a couple of weeks esp on exertion swelling in legs also

## 2013-05-07 NOTE — ED Notes (Signed)
To xray for CT scan.

## 2013-05-07 NOTE — ED Notes (Signed)
Lab called to add p bmp

## 2013-05-07 NOTE — ED Notes (Signed)
Pt still in CT scan.

## 2013-10-16 ENCOUNTER — Encounter (HOSPITAL_COMMUNITY): Payer: Self-pay | Admitting: Emergency Medicine

## 2013-10-16 ENCOUNTER — Emergency Department (HOSPITAL_COMMUNITY)
Admission: EM | Admit: 2013-10-16 | Discharge: 2013-10-16 | Disposition: A | Discharge status: Home / Self Care | Payer: Self-pay | Attending: Emergency Medicine | Admitting: Emergency Medicine

## 2013-10-16 ENCOUNTER — Emergency Department (HOSPITAL_COMMUNITY): Payer: Self-pay

## 2013-10-16 DIAGNOSIS — J209 Acute bronchitis, unspecified: Secondary | ICD-10-CM | POA: Insufficient documentation

## 2013-10-16 DIAGNOSIS — Z791 Long term (current) use of non-steroidal anti-inflammatories (NSAID): Secondary | ICD-10-CM | POA: Insufficient documentation

## 2013-10-16 DIAGNOSIS — Z880 Allergy status to penicillin: Secondary | ICD-10-CM | POA: Insufficient documentation

## 2013-10-16 DIAGNOSIS — G4733 Obstructive sleep apnea (adult) (pediatric): Secondary | ICD-10-CM | POA: Insufficient documentation

## 2013-10-16 DIAGNOSIS — R609 Edema, unspecified: Secondary | ICD-10-CM | POA: Insufficient documentation

## 2013-10-16 DIAGNOSIS — Z9981 Dependence on supplemental oxygen: Secondary | ICD-10-CM | POA: Insufficient documentation

## 2013-10-16 DIAGNOSIS — I10 Essential (primary) hypertension: Secondary | ICD-10-CM | POA: Insufficient documentation

## 2013-10-16 DIAGNOSIS — I509 Heart failure, unspecified: Secondary | ICD-10-CM | POA: Insufficient documentation

## 2013-10-16 DIAGNOSIS — E669 Obesity, unspecified: Secondary | ICD-10-CM | POA: Insufficient documentation

## 2013-10-16 DIAGNOSIS — E119 Type 2 diabetes mellitus without complications: Secondary | ICD-10-CM | POA: Insufficient documentation

## 2013-10-16 DIAGNOSIS — M109 Gout, unspecified: Secondary | ICD-10-CM | POA: Insufficient documentation

## 2013-10-16 DIAGNOSIS — E7800 Pure hypercholesterolemia, unspecified: Secondary | ICD-10-CM | POA: Insufficient documentation

## 2013-10-16 DIAGNOSIS — Z86718 Personal history of other venous thrombosis and embolism: Secondary | ICD-10-CM | POA: Insufficient documentation

## 2013-10-16 DIAGNOSIS — J40 Bronchitis, not specified as acute or chronic: Secondary | ICD-10-CM

## 2013-10-16 DIAGNOSIS — Z79899 Other long term (current) drug therapy: Secondary | ICD-10-CM | POA: Insufficient documentation

## 2013-10-16 LAB — PRO B NATRIURETIC PEPTIDE: Pro B Natriuretic peptide (BNP): 228.6 pg/mL — ABNORMAL HIGH (ref 0–125)

## 2013-10-16 LAB — BASIC METABOLIC PANEL
BUN: 18 mg/dL (ref 6–23)
CHLORIDE: 97 meq/L (ref 96–112)
CO2: 21 mEq/L (ref 19–32)
Calcium: 9.1 mg/dL (ref 8.4–10.5)
Creatinine, Ser: 0.95 mg/dL (ref 0.50–1.35)
GFR calc Af Amer: >90 mL/min (ref >90)
GFR calc non Af Amer: 89 mL/min — ABNORMAL LOW (ref >90)
GLUCOSE: 333 mg/dL — AB (ref 70–99)
Potassium: 4.4 mEq/L (ref 3.7–5.3)
Sodium: 136 mEq/L — ABNORMAL LOW (ref 137–147)

## 2013-10-16 LAB — I-STAT TROPONIN, ED: Troponin i, poc: 0.01 ng/mL (ref 0.00–0.08)

## 2013-10-16 LAB — CBC
HEMATOCRIT: 41.7 % (ref 39.0–52.0)
HEMOGLOBIN: 14.3 g/dL (ref 13.0–17.0)
MCH: 29.4 pg (ref 26.0–34.0)
MCHC: 34.3 g/dL (ref 30.0–36.0)
MCV: 85.8 fL (ref 78.0–100.0)
Platelets: 179 10*3/uL (ref 150–400)
RBC: 4.86 MIL/uL (ref 4.22–5.81)
RDW: 13.9 % (ref 11.5–15.5)
WBC: 6.6 10*3/uL (ref 4.0–10.5)

## 2013-10-16 MED ORDER — INSULIN ASPART 100 UNIT/ML ~~LOC~~ SOLN
5.0000 [IU] | Freq: Once | SUBCUTANEOUS | Status: AC
Start: 2013-10-16 — End: 2013-10-16
  Administered 2013-10-16: 5 [IU] via SUBCUTANEOUS
  Filled 2013-10-16: qty 1

## 2013-10-16 MED ORDER — ALBUTEROL SULFATE HFA 108 (90 BASE) MCG/ACT IN AERS: 2.0000 | INHALATION_SPRAY | RESPIRATORY_TRACT | Status: DC | PRN

## 2013-10-16 MED ORDER — AZITHROMYCIN 250 MG PO TABS: 250.0000 mg | ORAL_TABLET | Freq: Every day | ORAL | Status: DC

## 2013-10-16 MED ORDER — IOHEXOL 350 MG/ML SOLN
100.0000 mL | Freq: Once | INTRAVENOUS | Status: AC | PRN
  Administered 2013-10-16: 100 mL via INTRAVENOUS

## 2013-10-16 MED ORDER — FUROSEMIDE 40 MG PO TABS: 40.0000 mg | ORAL_TABLET | Freq: Every day | ORAL | Status: DC

## 2013-10-16 NOTE — ED Notes (Signed)
Dr. Pollina at bedside   

## 2013-10-16 NOTE — ED Notes (Signed)
Pt c/o sob and cough x 4 days.  Reports SOB at rest and with activity.  Pt reports having intermittent SOB over the past year.  Pt states SOB r/t fluid volume status.  Currently taking lasix (20mg  most days, but prescribed 40mg ).  Reports productive cough with green sputum.  Denies n/v/d/.  Denies chest pain. No acute distress, resp. Equal and unlabored, skin warm and dry.

## 2013-10-16 NOTE — ED Notes (Signed)
Pt provided an update. Pt wife provided diet ginger ale.

## 2013-10-16 NOTE — ED Notes (Signed)
Patient ambulated with no assistance. Maintained O2 sats well, keeping them above 94% on RA. No complaints voiced by patient at this time.

## 2013-10-16 NOTE — Discharge Instructions (Signed)
Bronchitis Bronchitis is inflammation of the airways that extend from the windpipe into the lungs (bronchi). The inflammation often causes mucus to develop, which leads to a cough. If the inflammation becomes severe, it may cause shortness of breath. CAUSES  Bronchitis may be caused by:   Viral infections.   Bacteria.   Cigarette smoke.   Allergens, pollutants, and other irritants.  SIGNS AND SYMPTOMS  The most common symptom of bronchitis is a frequent cough that produces mucus. Other symptoms include:  Fever.   Body aches.   Chest congestion.   Chills.   Shortness of breath.   Sore throat.  DIAGNOSIS  Bronchitis is usually diagnosed through a medical history and physical exam. Tests, such as chest X-rays, are sometimes done to rule out other conditions.  TREATMENT  You may need to avoid contact with whatever caused the problem (smoking, for example). Medicines are sometimes needed. These may include:  Antibiotics. These may be prescribed if the condition is caused by bacteria.  Cough suppressants. These may be prescribed for relief of cough symptoms.   Inhaled medicines. These may be prescribed to help open your airways and make it easier for you to breathe.   Steroid medicines. These may be prescribed for those with recurrent (chronic) bronchitis. HOME CARE INSTRUCTIONS  Get plenty of rest.   Drink enough fluids to keep your urine clear or pale yellow (unless you have a medical condition that requires fluid restriction). Increasing fluids may help thin your secretions and will prevent dehydration.   Only take over-the-counter or prescription medicines as directed by your health care provider.  Only take antibiotics as directed. Make sure you finish them even if you start to feel better.  Avoid secondhand smoke, irritating chemicals, and strong fumes. These will make bronchitis worse. If you are a smoker, quit smoking. Consider using nicotine gum or  skin patches to help control withdrawal symptoms. Quitting smoking will help your lungs heal faster.   Put a cool-mist humidifier in your bedroom at night to moisten the air. This may help loosen mucus. Change the water in the humidifier daily. You can also run the hot water in your shower and sit in the bathroom with the door closed for 5 10 minutes.   Follow up with your health care provider as directed.   Wash your hands frequently to avoid catching bronchitis again or spreading an infection to others.  SEEK MEDICAL CARE IF: Your symptoms do not improve after 1 week of treatment.  SEEK IMMEDIATE MEDICAL CARE IF:  Your fever increases.  You have chills.   You have chest pain.   You have worsening shortness of breath.   You have bloody sputum.  You faint.  You have lightheadedness.  You have a severe headache.   You vomit repeatedly. MAKE SURE YOU:   Understand these instructions.  Will watch your condition.  Will get help right away if you are not doing well or get worse. Document Released: 07/03/2005 Document Revised: 04/23/2013 Document Reviewed: 02/25/2013 [facility] Patient Information 2014 [place].  Edema Edema is an abnormal build-up of fluids in tissues. Because this is partly dependent on gravity (water flows to the lowest place), it is more common in the legs and thighs (lower extremities). It is also common in the looser tissues, like around the eyes. Painless swelling of the feet and ankles is common and increases as a person ages. It may affect both legs and may include the calves or even thighs. When squeezed,  the fluid may move out of the affected area and may leave a dent for a few moments. CAUSES   Prolonged standing or sitting in one place for extended periods of time. Movement helps pump tissue fluid into the veins, and absence of movement prevents this, resulting in edema.  Varicose veins. The valves in the veins do not work as well as  they should. This causes fluid to leak into the tissues.  Fluid and salt overload.  Injury, burn, or surgery to the leg, ankle, or foot, may damage veins and allow fluid to leak out.  Sunburn damages vessels. Leaky vessels allow fluid to go out into the sunburned tissues.  Allergies (from insect bites or stings, medications or chemicals) cause swelling by allowing vessels to become leaky.  Protein in the blood helps keep fluid in your vessels. Low protein, as in malnutrition, allows fluid to leak out.  Hormonal changes, including pregnancy and menstruation, cause fluid retention. This fluid may leak out of vessels and cause edema.  Medications that cause fluid retention. Examples are sex hormones, blood pressure medications, steroid treatment, or anti-depressants.  Some illnesses cause edema, especially heart failure, kidney disease, or liver disease.  Surgery that cuts veins or lymph nodes, such as surgery done for the heart or for breast cancer, may result in edema. DIAGNOSIS  Your caregiver is usually easily able to determine what is causing your swelling (edema) by simply asking what is wrong (getting a history) and examining you (doing a physical). Sometimes x-rays, EKG (electrocardiogram or heart tracing), and blood work may be done to evaluate for underlying medical illness. TREATMENT  General treatment includes:  Leg elevation (or elevation of the affected body part).  Restriction of fluid intake.  Prevention of fluid overload.  Compression of the affected body part. Compression with elastic bandages or support stockings squeezes the tissues, preventing fluid from entering and forcing it back into the blood vessels.  Diuretics (also called water pills or fluid pills) pull fluid out of your body in the form of increased urination. These are effective in reducing the swelling, but can have side effects and must be used only under your caregiver's supervision. Diuretics are  appropriate only for some types of edema. The specific treatment can be directed at any underlying causes discovered. Heart, liver, or kidney disease should be treated appropriately. HOME CARE INSTRUCTIONS   Elevate the legs (or affected body part) above the level of the heart, while lying down.  Avoid sitting or standing still for prolonged periods of time.  Avoid putting anything directly under the knees when lying down, and do not wear constricting clothing or garters on the upper legs.  Exercising the legs causes the fluid to work back into the veins and lymphatic channels. This may help the swelling go down.  The pressure applied by elastic bandages or support stockings can help reduce ankle swelling.  A low-salt diet may help reduce fluid retention and decrease the ankle swelling.  Take any medications exactly as prescribed. SEEK MEDICAL CARE IF:  Your edema is not responding to recommended treatments. SEEK IMMEDIATE MEDICAL CARE IF:   You develop shortness of breath or chest pain.  You cannot breathe when you lay down; or if, while lying down, you have to get up and go to the window to get your breath.  You are having increasing swelling without relief from treatment.  You develop a fever over 102 F (38.9 C).  You develop pain or redness in the  areas that are swollen.  Tell your caregiver right away if you have gained 03 lb/1.4 kg in 1 day or 05 lb/2.3 kg in a week. MAKE SURE YOU:   Understand these instructions.  Will watch your condition.  Will get help right away if you are not doing well or get worse. Document Released: 07/03/2005 Document Revised: 01/02/2012 Document Reviewed: 02/19/2008 [facility] Patient Information 2014 [place].

## 2013-10-16 NOTE — ED Notes (Signed)
Pt reports sob and cough/cold symptoms x 4 days. Went to [facility] today and sent here for cardiac workup. Denies any cp. Having productive cough with green sputum, sob with exertion. ekg done at triage. spo2 97% on room air, no resp distress noted.

## 2013-10-16 NOTE — ED Provider Notes (Signed)
CSN: [id number]     Arrival date & time 10/16/13  1354 History   First MD Initiated Contact with Patient 10/16/13 1520     Chief Complaint  Patient presents with  . Shortness of Breath     (Consider location/radiation/quality/duration/timing/severity/associated sxs/prior Treatment) HPI Comments: Patient presents to the ER for evaluation of shortness of breath. Patient reports that he has been extremely fatigued for the last several weeks or more. In the last four days he has developed a cough. Initially it was nonproductive, today he coughed up some greenish yellow sputum. He feels like he has been having some wheezing. He went to urgent care today and was told to come to the ER for cardiac evaluation.  Patient does have a history of obstructive sleep apnea. He also has a history of peripheral edema and takes Lasix, but tells me he has never been diagnosed with congestive heart failure. He is not experiencing any chest pain with symptoms.  Patient is a 58 y.o. male presenting with shortness of breath.  Shortness of Breath Associated symptoms: cough and wheezing   Associated symptoms: no chest pain and no fever     Past Medical History  Diagnosis Date  . DVT (deep venous thrombosis) 2006    "after ankle surgery"  . Gout   . Obesity   . Hypertension   . Diabetes mellitus   . Hypercholesteremia   . CHF (congestive heart failure)   . OSA on CPAP   . Peripheral edema     chronic   Past Surgical History  Procedure Laterality Date  . Hernia repair     History reviewed. No pertinent family history. History  Substance Use Topics  . Smoking status: Never Smoker   . Smokeless tobacco: Not on file  . Alcohol Use: No    Review of Systems  Constitutional: Negative for fever.  Respiratory: Positive for cough, shortness of breath and wheezing.   Cardiovascular: Negative for chest pain.  All other systems reviewed and are negative.      Allergies  Penicillins  Home  Medications   Current Outpatient Rx  Name  Route  Sig  Dispense  Refill  . acetaminophen (TYLENOL) 325 MG tablet   Oral   Take 650 mg by mouth every 6 (six) hours as needed for pain.         . diazepam (VALIUM) 5 MG tablet   Oral   Take 5 mg by mouth every 8 (eight) hours as needed. For muscle spasm         . furosemide (LASIX) 20 MG tablet   Oral   Take 20 mg by mouth 2 (two) times daily.          Marland Kitchen HYDROcodone-acetaminophen (NORCO/VICODIN) 5-325 MG per tablet   Oral   Take 1 tablet by mouth every 8 (eight) hours as needed for pain.         Marland Kitchen ibuprofen (ADVIL,MOTRIN) 200 MG tablet   Oral   Take 200 mg by mouth every 6 (six) hours as needed for pain.         . indomethacin (INDOCIN) 25 MG capsule   Oral   Take 25 mg by mouth 3 (three) times daily as needed. For gout flare ups         . losartan (COZAAR) 100 MG tablet   Oral   Take 100 mg by mouth daily.         Marland Kitchen lovastatin (MEVACOR) 40 MG tablet   Oral  Take 40 mg by mouth at bedtime.         . meloxicam (MOBIC) 7.5 MG tablet   Oral   Take 7.5 mg by mouth 2 (two) times daily.         . metFORMIN (GLUCOPHAGE) 500 MG tablet   Oral   Take 500 mg by mouth 2 (two) times daily with a meal.         . naproxen (NAPROSYN) 500 MG tablet   Oral   Take 500 mg by mouth 2 (two) times daily with a meal.         . valACYclovir (VALTREX) 500 MG tablet   Oral   Take 500 mg by mouth as needed (for fever blisters).          . vardenafil (LEVITRA) 20 MG tablet   Oral   Take 20 mg by mouth daily as needed for erectile dysfunction.          BP 117/65  Pulse 92  Temp(Src) 97.9 F (36.6 C) (Oral)  Resp 18  SpO2 98% Physical Exam  Constitutional: He is oriented to person, place, and time. He appears well-developed and well-nourished. No distress.  HENT:  Head: Normocephalic and atraumatic.  Right Ear: Hearing normal.  Left Ear: Hearing normal.  Nose: Nose normal.  Mouth/Throat: Oropharynx is  clear and moist and mucous membranes are normal.  Eyes: Conjunctivae and EOM are normal. Pupils are equal, round, and reactive to light.  Neck: Normal range of motion. Neck supple.  Cardiovascular: Regular rhythm, S1 normal and S2 normal.  Exam reveals no gallop and no friction rub.   No murmur heard. Pulmonary/Chest: Effort normal and breath sounds normal. No respiratory distress. He exhibits no tenderness.  Abdominal: Soft. Normal appearance and bowel sounds are normal. There is no hepatosplenomegaly. There is no tenderness. There is no rebound, no guarding, no tenderness at McBurney's point and negative Murphy's sign. No hernia.  Musculoskeletal: Normal range of motion. He exhibits edema.  Neurological: He is alert and oriented to person, place, and time. He has normal strength. No cranial nerve deficit or sensory deficit. Coordination normal. GCS eye subscore is 4. GCS verbal subscore is 5. GCS motor subscore is 6.  Skin: Skin is warm, dry and intact. No rash noted. No cyanosis.  Psychiatric: He has a normal mood and affect. His speech is normal and behavior is normal. Thought content normal.    ED Course  Procedures (including critical care time) Labs Review Labs Reviewed  BASIC METABOLIC PANEL - Abnormal; Notable for the following:    Sodium 136 (*)    Glucose, Bld 333 (*)    GFR calc non Af Amer 89 (*)    All other components within normal limits  PRO B NATRIURETIC PEPTIDE - Abnormal; Notable for the following:    Pro B Natriuretic peptide (BNP) 228.6 (*)    All other components within normal limits  CBC  I-STAT TROPOININ, ED   Imaging Review Dg Chest 2 View  10/16/2013   CLINICAL DATA:  Shortness of breath, wheezing and chest tightness.  EXAM: CHEST  2 VIEW  COMPARISON:  PA and lateral chest and CT chest 05/07/2013.  FINDINGS: Lungs clear. Heart size normal. No pneumothorax or pleural effusion.  IMPRESSION: Negative chest.   Electronically Signed   By: Inge Rise M.D.   On:  10/16/2013 15:25     EKG Interpretation   Date/Time:  Thursday October 16 2013 14:00:58 EDT Ventricular Rate:  96 PR  Interval:  170 QRS Duration: 100 QT Interval:  358 QTC Calculation: 452 R Axis:   -163 Text Interpretation:  Normal sinus rhythm Right superior axis deviation  Incomplete right bundle branch block Possible Right ventricular  hypertrophy Cannot rule out Anterior infarct , age undetermined Abnormal  ECG No significant change since last tracing Confirmed by Orie Cuttino  MD,  [place] [phone]) on 10/16/2013 3:22:34 PM      MDM   Final diagnoses:  None    Presents to the ER for evaluation of difficulty breathing. Patient reports that for the last 4 days he has had some sinus congestion, cough and has been feeling wheezy. He went to an urgent care today and was told to come to the ER for cardiac evaluation. Patient does have lower extremity edema, but this is chronic from peripheral edema for which he takes Lasix. He is supposed to take 40 mg a day, but only takes 20 because it makes him urinate too much. This is likely part of the problem. There is nothing to indicate congestive heart failure today. He does have a history of obstructive sleep apnea and cor pulmonale is a consideration, but his workup today has been large unremarkable. Was unable to get an echo performed today, will defer this to his primary care doctor.  Patient's symptoms over last 4 days her secondary to infectious etiology. He does not have pneumonia. Will treat with albuterol and Zithromax. Followup PCP and cardiology.    Orpah Greek, MD 10/16/13 2055

## 2013-10-16 NOTE — ED Notes (Signed)
Patient currently in CT °

## 2013-10-16 NOTE — ED Notes (Signed)
Pt states he received an albuterol treatment at urgent care this morning and it helped with his dyspnea.  Pt states he is no longer feeling SOB at rest, only with exertion.

## 2013-11-06 ENCOUNTER — Encounter: Payer: Self-pay | Admitting: Cardiovascular Disease

## 2013-11-06 ENCOUNTER — Ambulatory Visit (INDEPENDENT_AMBULATORY_CARE_PROVIDER_SITE_OTHER): Payer: Self-pay | Admitting: Cardiovascular Disease

## 2013-11-06 VITALS — BP 120/90 | HR 87 | Ht 72.0 in | Wt >= 6400 oz

## 2013-11-06 DIAGNOSIS — IMO0002 Reserved for concepts with insufficient information to code with codable children: Secondary | ICD-10-CM | POA: Insufficient documentation

## 2013-11-06 DIAGNOSIS — E785 Hyperlipidemia, unspecified: Secondary | ICD-10-CM

## 2013-11-06 DIAGNOSIS — IMO0001 Reserved for inherently not codable concepts without codable children: Secondary | ICD-10-CM

## 2013-11-06 DIAGNOSIS — G4733 Obstructive sleep apnea (adult) (pediatric): Secondary | ICD-10-CM

## 2013-11-06 DIAGNOSIS — L03119 Cellulitis of unspecified part of limb: Secondary | ICD-10-CM

## 2013-11-06 DIAGNOSIS — I10 Essential (primary) hypertension: Secondary | ICD-10-CM

## 2013-11-06 DIAGNOSIS — L03115 Cellulitis of right lower limb: Secondary | ICD-10-CM

## 2013-11-06 DIAGNOSIS — Z9989 Dependence on other enabling machines and devices: Secondary | ICD-10-CM

## 2013-11-06 DIAGNOSIS — L02419 Cutaneous abscess of limb, unspecified: Secondary | ICD-10-CM

## 2013-11-06 DIAGNOSIS — I509 Heart failure, unspecified: Secondary | ICD-10-CM

## 2013-11-06 DIAGNOSIS — R0602 Shortness of breath: Secondary | ICD-10-CM

## 2013-11-06 DIAGNOSIS — E1165 Type 2 diabetes mellitus with hyperglycemia: Secondary | ICD-10-CM | POA: Insufficient documentation

## 2013-11-06 DIAGNOSIS — E119 Type 2 diabetes mellitus without complications: Secondary | ICD-10-CM

## 2013-11-06 DIAGNOSIS — M7989 Other specified soft tissue disorders: Secondary | ICD-10-CM

## 2013-11-06 NOTE — Assessment & Plan Note (Signed)
Tolerating CPAP well. Resting well with no complaints

## 2013-11-06 NOTE — Assessment & Plan Note (Signed)
Etiology of his shortness of breath is concerning for pulmonary hypertension. He does have sleep apnea, leg edema. Uncertain if he has had much benefit on higher dose Lasix 40 mg daily. He reports that he urinates some in the morning but then it seems to stop. Echocardiogram has been ordered to evaluate his right heart pressures. If these are normal, with no other significant valve pathology, we have discussed whether to proceed with ischemia workup. Uncertain if he would be able to treadmill and would likely need two day myoview.

## 2013-11-06 NOTE — Assessment & Plan Note (Signed)
Most recent lab work available. Recommended that he stay on his lovastatin

## 2013-11-06 NOTE — Patient Instructions (Signed)
You are doing well. No medication changes were made.  We will schedule a echocardiogram for shortness of breath, leg swelling  Please call us if you have new issues that need to be addressed before your next appt.  We will call you with the results

## 2013-11-06 NOTE — Progress Notes (Signed)
Patient ID: Michael Martinez, male    DOB: 1955/10/20, 58 y.o.   MRN: [id number]  HPI Comments: [name] is a 58 year old gentleman with morbid obesity, obstructive sleep apnea who uses a CPAP, diabetes type 2 for the past 20 years who presents with shortness of breath for the past year.  He states that he lost his insurance several years ago after he developed a blood clot in his leg. He has not had much workup for his shortness of breath. At the end of March 2015 he had some worsening shortness of breath. He presented to urgent care at the beginning of April 2015 out of concern for bronchitis and his symptoms. EKG was performed and it was suggested that he go to the hospital for further evaluation. CT scan showed no PE. Cardiac enzymes were negative. Glucose levels in the 300s. He was recommended that he followup with cardiology. Since then Lasix dose has been increased from 20 mg daily up to 40 mg . He takes this once a day due to the inconvenience from polyuria .  He is unsure if the higher dose diuretic has helped his breathing but he continues to have symptoms. He reports having recent shortness of breath when he push mows. He sleeps well, wakes up only once, feels good when he wakes up in the morning like he is well rested. Previously was on the list to have gastric bypass. This was prior to losing his insurance . He denies any chest pain, diaphoresis, arm pain with exertion. He has chronic leg edema and wears compression hose . He does not do any regular exercise . In the past he reports losing over 100 pounds. This was 2 or 3 years ago. Since then he has gained the weight back  EKG shows normal sinus rhythm with rate 87 beats per minute, no significant ST or T wave changes. PVC noted    Outpatient Encounter Prescriptions as of 11/06/2013  Medication Sig  . albuterol (PROVENTIL HFA;VENTOLIN HFA) 108 (90 BASE) MCG/ACT inhaler Inhale 2 puffs into the lungs every 4 (four) hours as needed for  wheezing or shortness of breath.  . cetirizine (ZYRTEC) 10 MG tablet Take 10 mg by mouth daily.  . diazepam (VALIUM) 5 MG tablet Take 5 mg by mouth every 8 (eight) hours as needed. For muscle spasm  . fluticasone (FLONASE) 50 MCG/ACT nasal spray Place 2 sprays into both nostrils daily.  . furosemide (LASIX) 40 MG tablet Take 1 tablet (40 mg total) by mouth daily.  Marland Kitchen HYDROcodone-acetaminophen (NORCO/VICODIN) 5-325 MG per tablet Take 1 tablet by mouth every 8 (eight) hours as needed for pain.  Marland Kitchen ibuprofen (ADVIL,MOTRIN) 200 MG tablet Take 200 mg by mouth every 6 (six) hours as needed for pain.  . indomethacin (INDOCIN) 25 MG capsule Take 25 mg by mouth 3 (three) times daily as needed. For gout flare ups  . losartan (COZAAR) 100 MG tablet Take 100 mg by mouth daily.  Marland Kitchen lovastatin (MEVACOR) 40 MG tablet Take 40 mg by mouth at bedtime.  . meloxicam (MOBIC) 7.5 MG tablet Take 7.5 mg by mouth 2 (two) times daily.  . metFORMIN (GLUCOPHAGE) 500 MG tablet Take 500 mg by mouth 2 (two) times daily with a meal.  . valACYclovir (VALTREX) 500 MG tablet Take 500 mg by mouth as needed (for fever blisters).   . vardenafil (LEVITRA) 20 MG tablet Take 20 mg by mouth daily as needed for erectile dysfunction.  . naproxen (NAPROSYN) 500 MG tablet  Take 500 mg by mouth 2 (two) times daily with a meal.    Review of Systems  Constitutional: Negative.   HENT: Negative.   Eyes: Negative.   Respiratory: Positive for shortness of breath.   Cardiovascular: Positive for leg swelling.  Gastrointestinal: Negative.   Endocrine: Negative.   Musculoskeletal: Negative.   Skin: Negative.   Allergic/Immunologic: Negative.   Neurological: Negative.   Hematological: Negative.   Psychiatric/Behavioral: Negative.   All other systems reviewed and are negative.   BP 120/90  Pulse 87  Ht 6' (1.829 m)  Wt 407 lb 8 oz (184.841 kg)  BMI 55.25 kg/m2  Physical Exam  Nursing note and vitals reviewed. Constitutional: He is  oriented to person, place, and time. He appears well-developed and well-nourished.  Morbid obesity  HENT:  Head: Normocephalic.  Nose: Nose normal.  Mouth/Throat: Oropharynx is clear and moist.  Eyes: Conjunctivae are normal. Pupils are equal, round, and reactive to light.  Neck: Normal range of motion. Neck supple. No JVD present.  Cardiovascular: Normal rate, regular rhythm, S1 normal, S2 normal, normal heart sounds and intact distal pulses.  Exam reveals no gallop and no friction rub.   No murmur heard. Trace pitting leg edema  Pulmonary/Chest: Effort normal and breath sounds normal. No respiratory distress. He has no wheezes. He has no rales. He exhibits no tenderness.  Abdominal: Soft. Bowel sounds are normal. He exhibits no distension. There is no tenderness.  Musculoskeletal: Normal range of motion. He exhibits no edema and no tenderness.  Lymphadenopathy:    He has no cervical adenopathy.  Neurological: He is alert and oriented to person, place, and time. Coordination normal.  Skin: Skin is warm and dry. No rash noted. No erythema.  Psychiatric: He has a normal mood and affect. His behavior is normal. Judgment and thought content normal.      Assessment and Plan

## 2013-11-06 NOTE — Assessment & Plan Note (Signed)
Strongly recommended strict diet control, regular exercise. He denies any diabetic complications at this time

## 2013-11-06 NOTE — Assessment & Plan Note (Signed)
Blood pressure is well controlled on today's visit. No changes made to the medications. 

## 2013-11-06 NOTE — Assessment & Plan Note (Signed)
History of cellulitis x2 per the patient recently. He'll be high risk of cellulitis given his leg edema and uncontrolled diabetes

## 2013-11-06 NOTE — Assessment & Plan Note (Signed)
We have encouraged continued exercise, careful diet management in an effort to lose weight. 

## 2013-11-13 ENCOUNTER — Telehealth: Payer: Self-pay | Admitting: *Deleted

## 2013-11-13 NOTE — Telephone Encounter (Signed)
Spoke w/ pt's wife.  Advised her that I have spoken w/ Dr. Rockey Situ and gave her his recommendations.  Discussed w/ her pt's diet this week.  She reports that their daughter got married this weekend and he ate at the reception. He has had [place] food, leftovers, and [employer] this week. Advised her of what types of foods to avoid.  Advised her to limit his fluids today and continue his lasix 40mg  BID. Pt has compression hose and is wearing them now.  Advised her to keep appt for ECHO tomorrow am at 8:00. She is agreeable to this will call if we can be of further assistance.

## 2013-11-13 NOTE — Telephone Encounter (Signed)
Patient having a lot of sob, please call

## 2013-11-13 NOTE — Telephone Encounter (Signed)
Spoke w/ pt's wife.  She reports that pt's wt is up 5 lbs since last week, ankle edema, and increasing SOB.  He saw his PCP and was advised to increase lasix 40mg  to BID. He was started on a 12 day course of prendisone. She states that they were told that nothing more could be done until pt has results from his ECHO that is currently sched for tomorrow.  PCP advised pt to see about getting ECHO sooner or to go to the ED. Advised her that I would speak w/ Dr. Rockey Situ and call her back.

## 2013-11-14 ENCOUNTER — Other Ambulatory Visit (INDEPENDENT_AMBULATORY_CARE_PROVIDER_SITE_OTHER): Payer: Self-pay

## 2013-11-14 ENCOUNTER — Other Ambulatory Visit: Payer: Self-pay

## 2013-11-14 DIAGNOSIS — Z9989 Dependence on other enabling machines and devices: Secondary | ICD-10-CM

## 2013-11-14 DIAGNOSIS — G4733 Obstructive sleep apnea (adult) (pediatric): Secondary | ICD-10-CM

## 2013-11-14 DIAGNOSIS — R0602 Shortness of breath: Secondary | ICD-10-CM

## 2013-11-14 DIAGNOSIS — M7989 Other specified soft tissue disorders: Secondary | ICD-10-CM

## 2013-11-19 ENCOUNTER — Ambulatory Visit (INDEPENDENT_AMBULATORY_CARE_PROVIDER_SITE_OTHER): Payer: Self-pay | Admitting: Cardiovascular Disease

## 2013-11-19 ENCOUNTER — Encounter: Payer: Self-pay | Admitting: Cardiovascular Disease

## 2013-11-19 VITALS — BP 122/80 | HR 86 | Ht 72.0 in | Wt 395.2 lb

## 2013-11-19 DIAGNOSIS — E785 Hyperlipidemia, unspecified: Secondary | ICD-10-CM

## 2013-11-19 DIAGNOSIS — IMO0002 Reserved for concepts with insufficient information to code with codable children: Secondary | ICD-10-CM

## 2013-11-19 DIAGNOSIS — G4733 Obstructive sleep apnea (adult) (pediatric): Secondary | ICD-10-CM

## 2013-11-19 DIAGNOSIS — E1165 Type 2 diabetes mellitus with hyperglycemia: Secondary | ICD-10-CM

## 2013-11-19 DIAGNOSIS — I10 Essential (primary) hypertension: Secondary | ICD-10-CM

## 2013-11-19 DIAGNOSIS — Z9989 Dependence on other enabling machines and devices: Secondary | ICD-10-CM

## 2013-11-19 DIAGNOSIS — R0602 Shortness of breath: Secondary | ICD-10-CM

## 2013-11-19 DIAGNOSIS — IMO0001 Reserved for inherently not codable concepts without codable children: Secondary | ICD-10-CM

## 2013-11-19 MED ORDER — LEVOFLOXACIN 500 MG PO TABS: 500.0000 mg | ORAL_TABLET | Freq: Every day | ORAL | Status: DC

## 2013-11-19 NOTE — Assessment & Plan Note (Signed)
Recommended he be compliant on his CPAP

## 2013-11-19 NOTE — Progress Notes (Signed)
Patient ID: Michael Martinez, male    DOB: 1955/10/09, 58 y.o.   MRN: [id number]  HPI Comments: Michael Martinez is a 58 year old gentleman with morbid obesity, obstructive sleep apnea who uses a CPAP, diabetes type 2 for the past 20 years, history of DVT, who presents with shortness of breath for the past year. 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 He had Z-Pak x2 with no improvement of his symptoms  In followup today, he reports that shortness of breath, cough, wheezing started getting worse last week. Prior to that shortness of breath had been mild but stable. He was seen by primary care and started on prednisone taper. He is now at day 7 with no improvement in his symptoms. He reports a bronchospastic cough, no sputum. Symptoms typically present at rest. Denies having worsening symptoms when supine.   Since his last clinic visit, he is down 12 pounds after taking high dose Lasix, 60-80 mg daily. No dramatic improvement in his shortness of breath by weight coming down 10 pounds.  Weight was previously 407 pounds in our office, now 395  Prior CT scan showing no PE Recent echocardiogram showing mildly depressed ejection fraction, 45-50%, otherwise no significant valvular disease, normal right ventricular systolic pressures.  EKG shows normal sinus rhythm with rate 86 beats per minute, no significant ST or T wave changes.   Outpatient Encounter Prescriptions as of 11/19/2013  Medication Sig  . albuterol (PROVENTIL HFA;VENTOLIN HFA) 108 (90 BASE) MCG/ACT inhaler Inhale 2 puffs into the lungs every 4 (four) hours as needed for wheezing or shortness of breath.  . cetirizine (ZYRTEC) 10 MG tablet Take 10 mg by mouth daily.  . diazepam (VALIUM) 5 MG tablet Take 5 mg by mouth every 8 (eight) hours as needed. For muscle spasm  . fluticasone (FLONASE) 50 MCG/ACT nasal spray Place 2 sprays into both nostrils daily.  . furosemide (LASIX) 40 MG tablet  Take 1 tablet (40 mg total) by mouth daily.  Marland Kitchen HYDROcodone-acetaminophen (NORCO/VICODIN) 5-325 MG per tablet Take 1 tablet by mouth every 8 (eight) hours as needed for pain.  Marland Kitchen ibuprofen (ADVIL,MOTRIN) 200 MG tablet Take 200 mg by mouth every 6 (six) hours as needed for pain.  . indomethacin (INDOCIN) 25 MG capsule Take 25 mg by mouth 3 (three) times daily as needed. For gout flare ups  . losartan (COZAAR) 100 MG tablet Take 100 mg by mouth daily.  Marland Kitchen lovastatin (MEVACOR) 40 MG tablet Take 40 mg by mouth at bedtime.  . meloxicam (MOBIC) 7.5 MG tablet Take 7.5 mg by mouth 2 (two) times daily.  . metFORMIN (GLUCOPHAGE) 500 MG tablet Take 500 mg by mouth 2 (two) times daily with a meal.  . naproxen (NAPROSYN) 500 MG tablet Take 500 mg by mouth 2 (two) times daily with a meal.  . PREDNISONE, PAK, PO Take by mouth. Taper for 12 days.  . valACYclovir (VALTREX) 500 MG tablet Take 500 mg by mouth as needed (for fever blisters).   . vardenafil (LEVITRA) 20 MG tablet Take 20 mg by mouth daily as needed for erectile dysfunction.     Review of Systems  Constitutional: Negative.   HENT: Negative.   Eyes: Negative.   Respiratory: Positive for cough and shortness of breath.   Gastrointestinal: Negative.   Endocrine: Negative.   Musculoskeletal: Negative.   Skin: Negative.   Allergic/Immunologic: Negative.   Neurological: Negative.   Hematological: Negative.   Psychiatric/Behavioral: Negative.  All other systems reviewed and are negative.   BP 122/80  Pulse 86  Ht 6' (1.829 m)  Wt 395 lb 4 oz (179.284 kg)  BMI 53.59 kg/m2  Physical Exam  Nursing note and vitals reviewed. Constitutional: He is oriented to person, place, and time. He appears well-developed and well-nourished.  Morbid obesity  HENT:  Head: Normocephalic.  Nose: Nose normal.  Mouth/Throat: Oropharynx is clear and moist.  Eyes: Conjunctivae are normal. Pupils are equal, round, and reactive to light.  Neck: Normal range of  motion. Neck supple. No JVD present.  Cardiovascular: Normal rate, regular rhythm, S1 normal, S2 normal, normal heart sounds and intact distal pulses.  Exam reveals no gallop and no friction rub.   No murmur heard. Trace pitting leg edema  Pulmonary/Chest: Effort normal and breath sounds normal. No respiratory distress. He has no wheezes. He has no rales. He exhibits no tenderness.  Abdominal: Soft. Bowel sounds are normal. He exhibits no distension. There is no tenderness.  Musculoskeletal: Normal range of motion. He exhibits no edema and no tenderness.  Lymphadenopathy:    He has no cervical adenopathy.  Neurological: He is alert and oriented to person, place, and time. Coordination normal.  Skin: Skin is warm and dry. No rash noted. No erythema.  Psychiatric: He has a normal mood and affect. His behavior is normal. Judgment and thought content normal.      Assessment and Plan

## 2013-11-19 NOTE — Assessment & Plan Note (Signed)
Encouraged him to stay on his lovastatin

## 2013-11-19 NOTE — Assessment & Plan Note (Signed)
Blood pressure is well controlled on today's visit. No changes made to the medications. 

## 2013-11-19 NOTE — Patient Instructions (Addendum)
For your cough and SOB, Please start levaquin one a day  Please call if symptoms do not improve  Please call us if you have new issues that need to be addressed before your next appt.

## 2013-11-19 NOTE — Assessment & Plan Note (Signed)
Etiology of his shortness of breath is concerning for pulmonary source. He has a bronchospastic cough on today's visit. Recent normal echocardiogram. Recent 10 pound weight loss or more with high dose Lasix with no improvement of his symptoms. His leg edema does look improved. Recent CT scan showing no PE. No pleural effusion on CT. Overall he is still frustrated by his shortness of breath that seems to present at rest. He has not felt any improvement on prednisone. Denies having any sputum.  After long discussion, we have suggested he try a course of Levaquin for these 10 days. Certainly possible that he may have a viral infection that started last week.  His less likely that his symptoms are from ischemia given may occur at rest, not typically with exertion. We cannot exclude the possibility of further ischemia workup such as cardiac catheterization. I would not do a stress test given his body habitus and high likelihood of attenuation artifact. Would also not be able to treadmill given his shortness of breath.

## 2013-11-19 NOTE — Assessment & Plan Note (Signed)
Recommended strict diet, weight loss. Follow up with primary care for medication adjustment

## 2013-11-19 NOTE — Assessment & Plan Note (Signed)
We have encouraged continued careful diet management in an effort to lose weight.  

## 2013-11-20 ENCOUNTER — Encounter: Payer: Self-pay | Admitting: Cardiovascular Disease

## 2013-11-21 ENCOUNTER — Ambulatory Visit (INDEPENDENT_AMBULATORY_CARE_PROVIDER_SITE_OTHER): Payer: Self-pay | Admitting: *Deleted

## 2013-11-21 DIAGNOSIS — E785 Hyperlipidemia, unspecified: Secondary | ICD-10-CM

## 2013-11-21 DIAGNOSIS — E119 Type 2 diabetes mellitus without complications: Secondary | ICD-10-CM

## 2013-11-21 DIAGNOSIS — R0609 Other forms of dyspnea: Secondary | ICD-10-CM

## 2013-11-21 DIAGNOSIS — R0989 Other specified symptoms and signs involving the circulatory and respiratory systems: Secondary | ICD-10-CM

## 2013-11-21 DIAGNOSIS — I10 Essential (primary) hypertension: Secondary | ICD-10-CM

## 2013-11-22 LAB — COMPREHENSIVE METABOLIC PANEL
ALT: 34 IU/L (ref 0–44)
AST: 18 IU/L (ref 0–40)
Albumin/Globulin Ratio: 1.6 (ref 1.1–2.5)
Albumin: 4.1 g/dL (ref 3.5–5.5)
Alkaline Phosphatase: 144 IU/L — ABNORMAL HIGH (ref 39–117)
BUN / CREAT RATIO: 30 — AB (ref 9–20)
BUN: 34 mg/dL — ABNORMAL HIGH (ref 6–24)
CALCIUM: 9.2 mg/dL (ref 8.7–10.2)
CO2: 23 mmol/L (ref 18–29)
CREATININE: 1.15 mg/dL (ref 0.76–1.27)
Chloride: 89 mmol/L — ABNORMAL LOW (ref 97–108)
GFR calc Af Amer: 80 mL/min/{1.73_m2} (ref >59)
GFR calc non Af Amer: 69 mL/min/{1.73_m2} (ref >59)
GLOBULIN, TOTAL: 2.5 g/dL (ref 1.5–4.5)
Glucose: 417 mg/dL — ABNORMAL HIGH (ref 65–99)
Potassium: 5.4 mmol/L — ABNORMAL HIGH (ref 3.5–5.2)
Sodium: 133 mmol/L — ABNORMAL LOW (ref 134–144)
Total Bilirubin: 0.4 mg/dL (ref 0.0–1.2)
Total Protein: 6.6 g/dL (ref 6.0–8.5)

## 2013-11-22 LAB — LIPID PANEL
CHOL/HDL RATIO: 2.6 ratio (ref 0.0–5.0)
Cholesterol, Total: 181 mg/dL (ref 100–199)
HDL: 69 mg/dL (ref >39)
LDL Calculated: 91 mg/dL (ref 0–99)
TRIGLYCERIDES: 103 mg/dL (ref 0–149)
VLDL Cholesterol Cal: 21 mg/dL (ref 5–40)

## 2013-11-22 LAB — TSH: TSH: 0.862 u[IU]/mL (ref 0.450–4.500)

## 2013-11-22 LAB — HEMOGLOBIN A1C
ESTIMATED AVERAGE GLUCOSE: 275 mg/dL
Hgb A1c MFr Bld: 11.2 % — ABNORMAL HIGH (ref 4.8–5.6)

## 2013-11-27 ENCOUNTER — Telehealth: Payer: Self-pay

## 2013-11-27 NOTE — Telephone Encounter (Signed)
Major thing is glucose levels are up This has pushed his hemoglobin A1c greater than 11 He needs to get in touch with primary care or endocrinology to work on diabetes  Renal function showing slight dehydration This could also occur from high sugars spilling into the urine causing excessive urination  Cholesterol not bad, would stay on cholesterol medication Cholesterol numbers should improve as diabetes improves  Potassium is high. Need to cut back on citrus, hold potassium supplement pill

## 2013-11-27 NOTE — Telephone Encounter (Signed)
Reviewed results w/ pt. He verbalizes understanding and is agreeable to plan.  He has an appt w/ Dr. Rutherford Nail on 5/26 to discuss diabetes.

## 2013-11-27 NOTE — Telephone Encounter (Signed)
Pt would like lab results. Please call. 

## 2013-11-28 ENCOUNTER — Telehealth: Payer: Self-pay

## 2013-11-28 DIAGNOSIS — Z9989 Dependence on other enabling machines and devices: Principal | ICD-10-CM

## 2013-11-28 DIAGNOSIS — G4733 Obstructive sleep apnea (adult) (pediatric): Secondary | ICD-10-CM

## 2013-11-28 DIAGNOSIS — R0602 Shortness of breath: Secondary | ICD-10-CM

## 2013-11-28 NOTE — Telephone Encounter (Signed)
I would refer him to pulmonary at [facility] in [place]

## 2013-11-28 NOTE — Telephone Encounter (Signed)
Spoke w/ pt.  He states that he is still having sob and coughing so hard that he feels he may pass out.  He states that he feels that we "need to take another path of treatment". He would like to know if Dr. Rockey Situ thinks he should see a pulmonologist and if so, which one. Please advise.  Thank you.

## 2013-12-01 NOTE — Telephone Encounter (Signed)
Spoke w/ pt.  Advised him that Dr. Anastasia Pall next available appt is 12/17/13 @ 2:00. Pt states that he will be out of town during that time.  Gave him # to [facility] to call and reschedule this for a more convenient time.

## 2013-12-03 ENCOUNTER — Ambulatory Visit (INDEPENDENT_AMBULATORY_CARE_PROVIDER_SITE_OTHER)
Admission: RE | Admit: 2013-12-03 | Discharge: 2013-12-03 | Disposition: A | Discharge status: Home / Self Care | Payer: Self-pay | Source: Ambulatory Visit | Attending: Internal Medicine | Admitting: Internal Medicine

## 2013-12-03 ENCOUNTER — Ambulatory Visit (INDEPENDENT_AMBULATORY_CARE_PROVIDER_SITE_OTHER): Payer: Self-pay | Admitting: Internal Medicine

## 2013-12-03 ENCOUNTER — Encounter: Payer: Self-pay | Admitting: Internal Medicine

## 2013-12-03 VITALS — BP 124/70 | HR 105 | Ht 72.0 in | Wt >= 6400 oz

## 2013-12-03 DIAGNOSIS — R058 Other specified cough: Secondary | ICD-10-CM | POA: Insufficient documentation

## 2013-12-03 DIAGNOSIS — M7989 Other specified soft tissue disorders: Secondary | ICD-10-CM

## 2013-12-03 DIAGNOSIS — R05 Cough: Secondary | ICD-10-CM

## 2013-12-03 DIAGNOSIS — R059 Cough, unspecified: Secondary | ICD-10-CM

## 2013-12-03 MED ORDER — TRAMADOL HCL 50 MG PO TABS: ORAL_TABLET | ORAL | Status: DC

## 2013-12-03 MED ORDER — PANTOPRAZOLE SODIUM 40 MG PO TBEC: 40.0000 mg | DELAYED_RELEASE_TABLET | Freq: Every day | ORAL | Status: DC

## 2013-12-03 MED ORDER — FAMOTIDINE 20 MG PO TABS: ORAL_TABLET | ORAL | Status: DC

## 2013-12-03 MED ORDER — PREDNISONE 10 MG PO TABS: ORAL_TABLET | ORAL | Status: DC

## 2013-12-03 MED ORDER — TELMISARTAN 80 MG PO TABS: 80.0000 mg | ORAL_TABLET | Freq: Every day | ORAL | Status: DC

## 2013-12-03 NOTE — Assessment & Plan Note (Signed)

## 2013-12-03 NOTE — Patient Instructions (Addendum)
Please remember to go to the  x-ray department downstairs for your tests - we will call you with the results when they are available.  Stop meloxicam and cozaar  Prednisone 10 mg take  4 each am x 2 days,   2 each am x 2 days,  1 each am x 2 days and stop    Pantoprazole (protonix) 40 mg   Take 30-60 min before first meal of the day and Pepcid 20 mg one bedtime until return to office - this is the best way to tell whether stomach acid is contributing to your problem.    Take delsym two tsp every 12 hours and supplement if needed with  tramadol 50 mg up to 2 every 4 hours to suppress the urge to cough. Swallowing water or using ice chips/non mint and menthol containing candies (such as lifesavers or sugarless jolly ranchers) are also effective.  You should rest your voice and avoid activities that you know make you cough.  Once you have eliminated the cough for 3 straight days try reducing the tramadol first,  then the delsym as tolerated.    GERD (REFLUX)  is an extremely common cause of respiratory symptoms, many times with no significant heartburn at all.    It can be treated with medication, but also with lifestyle changes including avoidance of late meals, excessive alcohol, smoking cessation, and avoid fatty foods, chocolate, peppermint, colas, red wine, and acidic juices such as orange juice.  NO MINT OR MENTHOL PRODUCTS SO NO COUGH DROPS  USE SUGARLESS CANDY INSTEAD (jolley ranchers or Stover's)  NO OIL BASED VITAMINS - use powdered substitutes.    Please schedule a follow up office visit in 2 weeks if not 100% better

## 2013-12-03 NOTE — Progress Notes (Signed)
Quick Note:  LMTCB ______ 

## 2013-12-03 NOTE — Progress Notes (Signed)
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 Pulmonary Consult    Referred by Dr. Ida Rogue for SOB with and without activity and productive cough with clear mucous x 2 months.   2005 [facility] last eval of osa > on same cpap since  Last well x 4 years prior to OV   when lost down to 315 and progressively worse since in terms of doe Neb from daughter doesn't help breathing or coughing nor did prednisone or levaquin Noted worse leg swelling better p rx lasix which helped breathing a little but not the cough which is present 24/7 not necessarily at hs or early am or  assoc with eating or sinus problems  No obvious other patterns in day to day or daytime variabilty or assoc chronic cough or cp or chest tightness, subjective wheeze or overt   hb symptoms. No unusual exp hx or h/o childhood pna/ asthma or knowledge of premature birth.  Sleeping ok without nocturnal  or early am exacerbation  of respiratory  c/o's or need for noct saba. Also denies any obvious fluctuation of symptoms with weather or environmental changes or other aggravating or alleviating factors except as outlined above   Current Medications, Allergies, Complete Past Medical History, Past Surgical History, Family History, and Social History were reviewed in [employer] record.            Review of Systems  Constitutional: Negative for fever and unexpected weight change.  HENT: Negative for congestion, dental problem, ear pain, nosebleeds, postnasal drip, rhinorrhea, sinus pressure, sneezing, sore throat  and trouble swallowing.   Eyes: Negative for redness and itching.  Respiratory: Positive for cough and shortness of breath. Negative for chest tightness and wheezing.   Cardiovascular: Positive for leg swelling. Negative for palpitations.  Gastrointestinal: Negative for nausea and vomiting.  Genitourinary: Negative for dysuria.  Musculoskeletal: Negative for joint swelling.  Skin: Negative for rash.  Neurological: Negative for headaches.  Hematological: Does not bruise/bleed easily.  Psychiatric/Behavioral: Negative for dysphoric mood. The patient is not nervous/anxious.        Objective:   Physical Exam amb wm nad  Wt Readings from Last 3 Encounters:  12/03/13 407 lb 12.8 oz (184.977 kg)  11/19/13 395 lb 4 oz (179.284 kg)  11/06/13 407 lb 8 oz (184.841 kg)      HEENT: nl dentition, turbinates, and orophanx. Nl external ear canals without cough reflex   NECK :  without JVD/Nodes/TM/ nl carotid upstrokes bilaterally   LUNGS: no acc muscle use, clear to A and P bilaterally without cough on insp or exp maneuvers   CV:  RRR  no s3 or murmur or increase in P2, 1+ sym lower ext edema   ABD:  soft and nontender with nl excursion in the supine position. No bruits or organomegaly, bowel sounds nl  MS:  warm without deformities, calf tenderness, cyanosis or clubbing  SKIN: warm and dry with chronic venous stasis changes both legs   NEURO:  alert, approp, no deficits  CTa 10/16/13 No evidence of acute pulmonary thromboembolism  4 mm right lower lobe pulmonary nodule. If the patient is at high  risk for bronchogenic carcinoma, follow-up chest CT at 1 year is  recommended. If the patient is at low risk, no follow-up is needed   CXR  12/03/2013 :  No active cardiopulmonary disease.     Assessment & Plan:

## 2013-12-04 DIAGNOSIS — M7989 Other specified soft tissue disorders: Secondary | ICD-10-CM | POA: Insufficient documentation

## 2013-12-04 NOTE — Assessment & Plan Note (Signed)
Given the nl cardiac w/u by Dr Candis Musa this is most likely related to obesity and effects of nsaids which I rec he try off for now and just use tramadol for pain since he needs it anyway to control cough  See instructions for specific recommendations which were reviewed directly with the patient who was given a copy with highlighter outlining the key components.

## 2013-12-17 ENCOUNTER — Institutional Professional Consult (permissible substitution): Payer: Self-pay | Admitting: Pulmonary Disease

## 2013-12-24 ENCOUNTER — Ambulatory Visit: Payer: Self-pay | Admitting: Internal Medicine

## 2014-03-24 ENCOUNTER — Encounter (HOSPITAL_COMMUNITY): Payer: Self-pay | Admitting: Emergency Medicine

## 2014-03-24 ENCOUNTER — Inpatient Hospital Stay (HOSPITAL_COMMUNITY)
Admission: EM | Admit: 2014-03-24 | Discharge: 2014-03-26 | DRG: 603 | Disposition: A | Discharge status: Home / Self Care | Payer: Self-pay | Attending: Internal Medicine | Admitting: Internal Medicine

## 2014-03-24 DIAGNOSIS — R05 Cough: Secondary | ICD-10-CM

## 2014-03-24 DIAGNOSIS — IMO0001 Reserved for inherently not codable concepts without codable children: Secondary | ICD-10-CM | POA: Diagnosis present

## 2014-03-24 DIAGNOSIS — L0390 Cellulitis, unspecified: Secondary | ICD-10-CM | POA: Diagnosis present

## 2014-03-24 DIAGNOSIS — IMO0002 Reserved for concepts with insufficient information to code with codable children: Secondary | ICD-10-CM | POA: Diagnosis present

## 2014-03-24 DIAGNOSIS — Z9989 Dependence on other enabling machines and devices: Secondary | ICD-10-CM

## 2014-03-24 DIAGNOSIS — Z23 Encounter for immunization: Secondary | ICD-10-CM

## 2014-03-24 DIAGNOSIS — Z85820 Personal history of malignant melanoma of skin: Secondary | ICD-10-CM

## 2014-03-24 DIAGNOSIS — L03119 Cellulitis of unspecified part of limb: Principal | ICD-10-CM

## 2014-03-24 DIAGNOSIS — L02419 Cutaneous abscess of limb, unspecified: Principal | ICD-10-CM | POA: Diagnosis present

## 2014-03-24 DIAGNOSIS — Z6841 Body Mass Index (BMI) 40.0 and over, adult: Secondary | ICD-10-CM

## 2014-03-24 DIAGNOSIS — I509 Heart failure, unspecified: Secondary | ICD-10-CM | POA: Diagnosis present

## 2014-03-24 DIAGNOSIS — I158 Other secondary hypertension: Secondary | ICD-10-CM

## 2014-03-24 DIAGNOSIS — E669 Obesity, unspecified: Secondary | ICD-10-CM | POA: Diagnosis present

## 2014-03-24 DIAGNOSIS — R058 Other specified cough: Secondary | ICD-10-CM

## 2014-03-24 DIAGNOSIS — B009 Herpesviral infection, unspecified: Secondary | ICD-10-CM | POA: Diagnosis present

## 2014-03-24 DIAGNOSIS — L03115 Cellulitis of right lower limb: Secondary | ICD-10-CM | POA: Diagnosis present

## 2014-03-24 DIAGNOSIS — I10 Essential (primary) hypertension: Secondary | ICD-10-CM | POA: Diagnosis present

## 2014-03-24 DIAGNOSIS — G4733 Obstructive sleep apnea (adult) (pediatric): Secondary | ICD-10-CM | POA: Diagnosis present

## 2014-03-24 DIAGNOSIS — E1165 Type 2 diabetes mellitus with hyperglycemia: Secondary | ICD-10-CM | POA: Diagnosis present

## 2014-03-24 DIAGNOSIS — I159 Secondary hypertension, unspecified: Secondary | ICD-10-CM

## 2014-03-24 DIAGNOSIS — R0602 Shortness of breath: Secondary | ICD-10-CM

## 2014-03-24 DIAGNOSIS — Z79899 Other long term (current) drug therapy: Secondary | ICD-10-CM

## 2014-03-24 DIAGNOSIS — I5022 Chronic systolic (congestive) heart failure: Secondary | ICD-10-CM | POA: Diagnosis present

## 2014-03-24 DIAGNOSIS — E785 Hyperlipidemia, unspecified: Secondary | ICD-10-CM | POA: Diagnosis present

## 2014-03-24 DIAGNOSIS — M109 Gout, unspecified: Secondary | ICD-10-CM | POA: Diagnosis present

## 2014-03-24 DIAGNOSIS — Z86718 Personal history of other venous thrombosis and embolism: Secondary | ICD-10-CM

## 2014-03-24 DIAGNOSIS — M7989 Other specified soft tissue disorders: Secondary | ICD-10-CM

## 2014-03-24 DIAGNOSIS — B001 Herpesviral vesicular dermatitis: Secondary | ICD-10-CM

## 2014-03-24 HISTORY — DX: Unspecified osteoarthritis, unspecified site: M19.90

## 2014-03-24 HISTORY — DX: Cutaneous abscess of limb, unspecified: L02.419

## 2014-03-24 HISTORY — DX: Shortness of breath: R06.02

## 2014-03-24 HISTORY — DX: Cellulitis of unspecified part of limb: L03.119

## 2014-03-24 HISTORY — DX: Gastro-esophageal reflux disease without esophagitis: K21.9

## 2014-03-24 LAB — CBC WITH DIFFERENTIAL/PLATELET
BASOS ABS: 0 10*3/uL (ref 0.0–0.1)
Basophils Relative: 0 % (ref 0–1)
EOS ABS: 0 10*3/uL (ref 0.0–0.7)
EOS PCT: 0 % (ref 0–5)
HCT: 37.7 % — ABNORMAL LOW (ref 39.0–52.0)
Hemoglobin: 13 g/dL (ref 13.0–17.0)
Lymphocytes Relative: 8 % — ABNORMAL LOW (ref 12–46)
Lymphs Abs: 0.8 10*3/uL (ref 0.7–4.0)
MCH: 29.2 pg (ref 26.0–34.0)
MCHC: 34.5 g/dL (ref 30.0–36.0)
MCV: 84.7 fL (ref 78.0–100.0)
Monocytes Absolute: 0.4 10*3/uL (ref 0.1–1.0)
Monocytes Relative: 5 % (ref 3–12)
Neutro Abs: 8.6 10*3/uL — ABNORMAL HIGH (ref 1.7–7.7)
Neutrophils Relative %: 87 % — ABNORMAL HIGH (ref 43–77)
PLATELETS: 154 10*3/uL (ref 150–400)
RBC: 4.45 MIL/uL (ref 4.22–5.81)
RDW: 13.6 % (ref 11.5–15.5)
WBC: 9.9 10*3/uL (ref 4.0–10.5)

## 2014-03-24 LAB — COMPREHENSIVE METABOLIC PANEL
ALT: 22 U/L (ref 0–53)
AST: 18 U/L (ref 0–37)
Albumin: 2.7 g/dL — ABNORMAL LOW (ref 3.5–5.2)
Alkaline Phosphatase: 68 U/L (ref 39–117)
Anion gap: 15 (ref 5–15)
BUN: 18 mg/dL (ref 6–23)
CALCIUM: 8.4 mg/dL (ref 8.4–10.5)
CO2: 21 mEq/L (ref 19–32)
Chloride: 95 mEq/L — ABNORMAL LOW (ref 96–112)
Creatinine, Ser: 1.2 mg/dL (ref 0.50–1.35)
GFR calc non Af Amer: 64 mL/min — ABNORMAL LOW (ref >90)
GFR, EST AFRICAN AMERICAN: 74 mL/min — AB (ref >90)
GLUCOSE: 150 mg/dL — AB (ref 70–99)
Potassium: 3.8 mEq/L (ref 3.7–5.3)
Sodium: 131 mEq/L — ABNORMAL LOW (ref 137–147)
TOTAL PROTEIN: 6.2 g/dL (ref 6.0–8.3)
Total Bilirubin: 0.5 mg/dL (ref 0.3–1.2)

## 2014-03-24 LAB — GLUCOSE, CAPILLARY
Glucose-Capillary: 174 mg/dL — ABNORMAL HIGH (ref 70–99)
Glucose-Capillary: 179 mg/dL — ABNORMAL HIGH (ref 70–99)
Glucose-Capillary: 138 mg/dL — ABNORMAL HIGH (ref 70–99)
Glucose-Capillary: 162 mg/dL — ABNORMAL HIGH (ref 70–99)
Glucose-Capillary: 172 mg/dL — ABNORMAL HIGH (ref 70–99)

## 2014-03-24 LAB — PRO B NATRIURETIC PEPTIDE: Pro B Natriuretic peptide (BNP): 692.8 pg/mL — ABNORMAL HIGH (ref 0–125)

## 2014-03-24 LAB — HEMOGLOBIN A1C
HEMOGLOBIN A1C: 6.8 % — AB (ref <5.7)
MEAN PLASMA GLUCOSE: 148 mg/dL — AB (ref <117)

## 2014-03-24 LAB — TROPONIN I

## 2014-03-24 LAB — I-STAT CG4 LACTIC ACID, ED: Lactic Acid, Venous: 1.18 mmol/L (ref 0.5–2.2)

## 2014-03-24 MED ORDER — VALACYCLOVIR HCL 500 MG PO TABS
1000.0000 mg | ORAL_TABLET | Freq: Once | ORAL | Status: AC
  Administered 2014-03-24: 1000 mg via ORAL
  Filled 2014-03-24: qty 2

## 2014-03-24 MED ORDER — SODIUM CHLORIDE 0.9 % IJ SOLN
3.0000 mL | Freq: Two times a day (BID) | INTRAMUSCULAR | Status: DC
  Administered 2014-03-24 – 2014-03-25 (×3): 3 mL via INTRAVENOUS

## 2014-03-24 MED ORDER — DIAZEPAM 5 MG PO TABS: 5.0000 mg | ORAL_TABLET | Freq: Three times a day (TID) | ORAL | Status: DC | PRN

## 2014-03-24 MED ORDER — INSULIN ASPART 100 UNIT/ML ~~LOC~~ SOLN
0.0000 [IU] | Freq: Three times a day (TID) | SUBCUTANEOUS | Status: DC
  Administered 2014-03-24: 1 [IU] via SUBCUTANEOUS
  Administered 2014-03-24 – 2014-03-26 (×6): 2 [IU] via SUBCUTANEOUS

## 2014-03-24 MED ORDER — INFLUENZA VAC SPLIT QUAD 0.5 ML IM SUSY
0.5000 mL | PREFILLED_SYRINGE | INTRAMUSCULAR | Status: AC
  Administered 2014-03-25: 0.5 mL via INTRAMUSCULAR
  Filled 2014-03-24: qty 0.5

## 2014-03-24 MED ORDER — FUROSEMIDE 20 MG PO TABS
20.0000 mg | ORAL_TABLET | Freq: Two times a day (BID) | ORAL | Status: DC
  Administered 2014-03-24 – 2014-03-26 (×5): 20 mg via ORAL
  Filled 2014-03-24 (×7): qty 1

## 2014-03-24 MED ORDER — HYDROCODONE-ACETAMINOPHEN 5-325 MG PO TABS
1.0000 | ORAL_TABLET | Freq: Four times a day (QID) | ORAL | Status: DC | PRN
  Administered 2014-03-24: 1 via ORAL
  Filled 2014-03-24: qty 1

## 2014-03-24 MED ORDER — INDOMETHACIN 25 MG PO CAPS
25.0000 mg | ORAL_CAPSULE | Freq: Every day | ORAL | Status: DC | PRN
  Filled 2014-03-24: qty 1

## 2014-03-24 MED ORDER — HEPARIN SODIUM (PORCINE) 5000 UNIT/ML IJ SOLN
5000.0000 [IU] | Freq: Three times a day (TID) | INTRAMUSCULAR | Status: DC
  Administered 2014-03-24 – 2014-03-26 (×6): 5000 [IU] via SUBCUTANEOUS
  Filled 2014-03-24 (×9): qty 1

## 2014-03-24 MED ORDER — SODIUM CHLORIDE 0.9 % IV SOLN: 250.0000 mL | INTRAVENOUS | Status: DC | PRN

## 2014-03-24 MED ORDER — OXYCODONE HCL 5 MG PO TABS
5.0000 mg | ORAL_TABLET | ORAL | Status: DC | PRN
  Administered 2014-03-24: 5 mg via ORAL
  Filled 2014-03-24: qty 1

## 2014-03-24 MED ORDER — SODIUM CHLORIDE 0.9 % IJ SOLN
3.0000 mL | Freq: Two times a day (BID) | INTRAMUSCULAR | Status: DC
  Administered 2014-03-25 – 2014-03-26 (×2): 3 mL via INTRAVENOUS

## 2014-03-24 MED ORDER — VANCOMYCIN HCL 10 G IV SOLR
1500.0000 mg | Freq: Two times a day (BID) | INTRAVENOUS | Status: DC
  Administered 2014-03-24 – 2014-03-26 (×4): 1500 mg via INTRAVENOUS
  Filled 2014-03-24 (×5): qty 1500

## 2014-03-24 MED ORDER — SIMVASTATIN 20 MG PO TABS
20.0000 mg | ORAL_TABLET | Freq: Every day | ORAL | Status: DC
  Administered 2014-03-24 – 2014-03-25 (×2): 20 mg via ORAL
  Filled 2014-03-24 (×3): qty 1

## 2014-03-24 MED ORDER — LOSARTAN POTASSIUM 50 MG PO TABS
100.0000 mg | ORAL_TABLET | Freq: Every day | ORAL | Status: DC
  Administered 2014-03-24 – 2014-03-26 (×3): 100 mg via ORAL
  Filled 2014-03-24 (×3): qty 2

## 2014-03-24 MED ORDER — HYDROCODONE-ACETAMINOPHEN 5-325 MG PO TABS
1.0000 | ORAL_TABLET | Freq: Four times a day (QID) | ORAL | Status: DC | PRN
  Administered 2014-03-24 – 2014-03-25 (×2): 1 via ORAL
  Filled 2014-03-24 (×2): qty 1

## 2014-03-24 MED ORDER — VANCOMYCIN HCL IN DEXTROSE 1-5 GM/200ML-% IV SOLN
1000.0000 mg | Freq: Once | INTRAVENOUS | Status: DC
  Filled 2014-03-24: qty 200

## 2014-03-24 MED ORDER — SODIUM CHLORIDE 0.9 % IJ SOLN: 3.0000 mL | INTRAMUSCULAR | Status: DC | PRN

## 2014-03-24 MED ORDER — INSULIN GLARGINE 100 UNIT/ML ~~LOC~~ SOLN
10.0000 [IU] | Freq: Every day | SUBCUTANEOUS | Status: DC
  Administered 2014-03-24 – 2014-03-26 (×3): 10 [IU] via SUBCUTANEOUS
  Filled 2014-03-24 (×3): qty 0.1

## 2014-03-24 MED ORDER — ACETAMINOPHEN 500 MG PO TABS
1000.0000 mg | ORAL_TABLET | Freq: Four times a day (QID) | ORAL | Status: DC | PRN
  Administered 2014-03-24 – 2014-03-26 (×3): 1000 mg via ORAL
  Filled 2014-03-24 (×3): qty 2

## 2014-03-24 NOTE — ED Notes (Signed)
Admitting MD at bedside.

## 2014-03-24 NOTE — Progress Notes (Signed)
Patient has home CPAP unit set up at bedside and is able to place himself on/off as needed.  Patient wife at bedside. Patient encouraged to call if assistance needed.

## 2014-03-24 NOTE — Progress Notes (Signed)
UR complete. Carless Slatten RN CCM Case Mgmt  

## 2014-03-24 NOTE — H&P (Signed)
[facility] History and Physical  PHOENIX DRESSER ONG:[id number] DOB: [birth year] DOA: 03/24/2014  Referring physician: ED physician PCP: Ashok Norris, MD  Specialists:   Chief Complaint: Left leg swelling and pain  HPI: Michael Martinez is a 58 y.o. male with a past medical history of poorly controlled diabetes, hypertension, hyperlipidemia, systolic congestive heart failure (EF 45 to 50 on 11/14/13), who presents with right leg swelling and pain.   Patient has a history of recurrent leg cellulitis. He had 4 times of leg cellulitis in last year. Usually when the patient has mild cellulitis, he took clindamycin which was prescribed by PCP and his symptoms were usually controlled well. This time patient started having right leg swelling and pain on 03/21/14. At the time, he had fever and chills, with body temperature of 103 at home. She took clindamycin as usually he does, without significant help. His leg swelling and pain have been progressively getting worse. Therefore he comes to the ED for further evaluation and treatment.   Review of Systems: As presented in the history of presenting illness, rest negative.  Where does patient live?   Can patient participate in ADLs? Yes  Allergy:  Allergies  Allergen Reactions  . Penicillins     Childhood allergy    Past Medical History  Diagnosis Date  . DVT (deep venous thrombosis) 2006    "after ankle surgery"  . Gout   . Obesity   . Hypertension   . Diabetes mellitus   . Hypercholesteremia   . CHF (congestive heart failure)   . OSA on CPAP   . Peripheral edema     chronic  . Clotting disorder     deep vein thrombosis left leg  . Melanoma     left ear    Past Surgical History  Procedure Laterality Date  . Hernia repair    . Tonsillectomy    . Skin cancer excision      Social History:  reports that he has never smoked. He has never used smokeless tobacco. He reports that he does not drink alcohol or use illicit  drugs.  Family History:  Family History  Problem Relation Age of Onset  . Stroke Mother   . Hypertension Mother   . Asthma Mother   . Hypertension Father   . Asthma Sister   . Asthma Sister   . Asthma Daughter   . Tuberculosis Mother      Prior to Admission medications   Medication Sig Start Date End Date Taking? Authorizing Provider  acetaminophen (TYLENOL) 500 MG tablet Take 1,000 mg by mouth every 6 (six) hours as needed for mild pain.   Yes Historical Provider, MD  clindamycin (CLEOCIN) 150 MG capsule Take 150 mg by mouth 2 (two) times daily.   Yes Historical Provider, MD  diazepam (VALIUM) 5 MG tablet Take 5 mg by mouth every 8 (eight) hours as needed. For muscle spasm   Yes Historical Provider, MD  furosemide (LASIX) 20 MG tablet Take 20 mg by mouth 2 (two) times daily.   Yes Historical Provider, MD  glimepiride (AMARYL) 4 MG tablet Take 4 mg by mouth daily with breakfast.   Yes Historical Provider, MD  HYDROcodone-acetaminophen (NORCO/VICODIN) 5-325 MG per tablet Take 1 tablet by mouth every 8 (eight) hours as needed for pain.   Yes Historical Provider, MD  ibuprofen (ADVIL,MOTRIN) 200 MG tablet Take 400 mg by mouth every 6 (six) hours as needed for moderate pain.   Yes Historical Provider, MD  indomethacin (INDOCIN) 25 MG capsule Take 25 mg by mouth daily as needed (gout). For 3 days   Yes Historical Provider, MD  losartan (COZAAR) 100 MG tablet Take 100 mg by mouth daily.   Yes Historical Provider, MD  lovastatin (MEVACOR) 40 MG tablet Take 40 mg by mouth at bedtime.   Yes Historical Provider, MD  metFORMIN (GLUCOPHAGE) 500 MG tablet Take 1,000 mg by mouth 2 (two) times daily with a meal.    Yes Historical Provider, MD  naproxen (NAPROSYN) 500 MG tablet Take 500 mg by mouth 2 (two) times daily with a meal.   Yes Historical Provider, MD  valACYclovir (VALTREX) 500 MG tablet Take 500 mg by mouth 2 (two) times daily as needed (for fever blisters). For 5 days   Yes Historical  Provider, MD  vardenafil (LEVITRA) 20 MG tablet Take 20 mg by mouth daily as needed for erectile dysfunction.   Yes Historical Provider, MD    Physical Exam: Filed Vitals:   03/24/14 0400 03/24/14 0415 03/24/14 0445 03/24/14 0500  BP: 117/66 116/74 123/73 116/99  Pulse: 88 86 91 89  Temp:      Resp: 16 23 18 27   SpO2: 98% 98% 99% 100%   General: Not in acute distress HEENT:       Eyes: PERRL, EOMI, no scleral icterus       ENT: No discharge from the ears and nose, no pharynx injection, no tonsillar enlargement.        Neck: No JVD, no bruit, no mass felt. Cardiac: S1/S2, RRR, No murmurs, gallops or rubs Pulm: Good air movement bilaterally. Clear to auscultation bilaterally. No rales, wheezing, rhonchi or rubs. Abd: Soft, nondistended, nontender, no rebound pain, no organomegaly, BS present Ext: right lower leg has redness, swelling and tenderness circumferentially, without purulent discharge.  2+DP/PT pulse bilaterally Musculoskeletal: No joint deformities, erythema, or stiffness, ROM full Skin: No rashes.  Neuro: Alert and oriented X3, cranial nerves II-XII grossly intact, muscle strength 5/5 in all extremeties, sensation to light touch intact. Psych: Patient is not psychotic, no suicidal or hemocidal ideation.  Labs on Admission:  Basic Metabolic Panel:  Recent Labs Lab 03/24/14 0023  NA 131*  K 3.8  CL 95*  CO2 21  GLUCOSE 150*  BUN 18  CREATININE 1.20  CALCIUM 8.4   Liver Function Tests:  Recent Labs Lab 03/24/14 0023  AST 18  ALT 22  ALKPHOS 68  BILITOT 0.5  PROT 6.2  ALBUMIN 2.7*   No results found for this basename: LIPASE, AMYLASE,  in the last 168 hours No results found for this basename: AMMONIA,  in the last 168 hours CBC:  Recent Labs Lab 03/24/14 0023  WBC 9.9  NEUTROABS 8.6*  HGB 13.0  HCT 37.7*  MCV 84.7  PLT 154   Cardiac Enzymes: No results found for this basename: CKTOTAL, CKMB, CKMBINDEX, TROPONINI,  in the last 168 hours  BNP  (last 3 results)  Recent Labs  05/07/13 1012 10/16/13 1407  PROBNP 633.5* 228.6*   CBG: No results found for this basename: GLUCAP,  in the last 168 hours  Radiological Exams on Admission: No results found.  Assessment/Plan Principal Problem:   Cellulitis of right leg Active Problems:   HTN (hypertension)   OSA on CPAP   Hyperlipemia   Diabetes type 2, uncontrolled   Cellulitis   1.  Right leg cellulitis: Patient has recurrent leg cellulitis, possibly due to poorly controlled diabetes. Usually when the patient has mild cellulitis,  he takes clindamycin which was prescribed to to her PCP and his symptoms were usually controlled well. This time patient failed oral clindamycin at home. His leg has worsening swelling. He also had a fever and chills at home. Currently patient does not have fever or chills, not have sepsis. He is hemodynamically stable now.   -will admit to tele bed given his sCHF -IV vancomycin. Will not start anaerobe coverage at this time given no purulent discharge - blood culture x 2.  - Pain control with Norco. - LE doppler was ordered by Ed to r/o DVT  2. DM-II: Poorly controlled. Last A1c was 11.2 on 11/21/16. Patient is taking oral medications at home without using insulin. -will D/C home oral medications - start lantus 10 u daily - SSI -check A1c  3. Systolic congestive heart failure: Stable. Patient is taking Lasix 20 mg twice a day at home. No signs of acute exacerbation on this admission. -will continue home medications, including Lasix 20 mg twice a day -will monitor blood pressure closely. Patient is at risk of developing sepsis. If this happens, should discontinue Lasix and blood pressure medications and start IV fluids. -EKG -Pro BNP -trop x 1  4. HTN: BP well controlled on admission.  -will continue home meds.  5. OSA: On CPAP at home, which will be continued on admission  - CPAP qhs   DVT ppx: SQ Heparin  Code Status: Full code Family  Communication: Yes, patient's wife at bed side Disposition Plan: Admit to inpatient  Ivor Costa [facility] Pager [phone]  If 7PM-7AM, please contact night-coverage [email or web address] Password [email or web address] 03/24/2014, 5:29 AM

## 2014-03-24 NOTE — Progress Notes (Signed)
Michael Martinez [id number] Admitted to 4U98: 03/24/2014 7:11 AM Attending Provider: Hosie Poisson, MD    Michael Martinez is a 58 y.o. male patient admitted from ED awake, alert  & orientated  X 3,  Full Code, VSS - Blood pressure 123/75, pulse 91, temperature 98.5 F (36.9 C), temperature source Oral, resp. rate 18, height 6' (1.829 m), weight 177.765 kg (391 lb 14.4 oz), SpO2 99.00%.RA, no c/o shortness of breath, no c/o chest pain, no distress noted. Tele # [id number] placed.  IV site WDL:  with a transparent dsg that's clean dry and intact.  Allergies:   Allergies  Allergen Reactions  . Penicillins     Childhood allergy     Past Medical History  Diagnosis Date  . DVT (deep venous thrombosis) 2006    "after ankle surgery"  . Gout   . Obesity   . Hypertension   . Diabetes mellitus   . Hypercholesteremia   . CHF (congestive heart failure)   . OSA on CPAP   . Peripheral edema     chronic  . Clotting disorder     deep vein thrombosis left leg  . Melanoma     left ear    History:  obtained from patient/family  Pt orientation to unit, room and routine. Information packet given to patient/family and safety video refused.  Admission INP armband ID verified with patient/family, and in place. SR up x 2, fall risk assessment complete with Patient and family verbalizing understanding of risks associated with falls. Pt verbalizes an understanding of how to use the call bell and to call for help before getting out of bed.  Skin, RLE edematous, red, and warm to the touch.    Will cont to monitor and assist as needed.  Parthenia Ames, RN 03/24/2014 7:11 AM

## 2014-03-24 NOTE — Progress Notes (Signed)
Notified Schorr, NP Pt states that he can feel a fever blister coming on. Pt states he takes Valtrex at home for fever blister flare ups. Schorr, NP put in order for Valtrex x1. Will continue to monitor pt. Ranelle Oyster, RN

## 2014-03-24 NOTE — ED Notes (Signed)
Report called to Clarene Critchley, RN unit 5W.

## 2014-03-24 NOTE — ED Provider Notes (Signed)
CSN: [id number]     Arrival date & time 03/24/14  0006 History   First MD Initiated Contact with Patient 03/24/14 0355     Chief Complaint  Patient presents with  . Cellulitis     (Consider location/radiation/quality/duration/timing/severity/associated sxs/prior Treatment) HPI Pt present with c/o redness and swelling and pain of his anterior right lower extremity.  He states he has had cellulitis in this leg in the past and that his symptoms feel similar.  He had fever 3 days ago- started himself on some leftover clindamycin and has had 7 doses.  Redness is spreading and pain is increasing. He has stopped having fevers. No nausea/vomiting or malaise.  There are no other associated systemic symptoms, there are no other alleviating or modifying factors.   Past Medical History  Diagnosis Date  . DVT (deep venous thrombosis) 2006    "after ankle surgery"  . Gout   . Obesity   . Hypertension   . Hypercholesteremia   . CHF (congestive heart failure)   . OSA on CPAP   . Peripheral edema     chronic  . Clotting disorder     deep vein thrombosis left leg  . Melanoma     left ear  . Shortness of breath   . Cellulitis and abscess of lower extremity 03/24/2014    RT LEG  . Diabetes mellitus     TYPE 2  . GERD (gastroesophageal reflux disease)   . Arthritis     RA   Past Surgical History  Procedure Laterality Date  . Hernia repair    . Tonsillectomy    . Skin cancer excision     Family History  Problem Relation Age of Onset  . Stroke Mother   . Hypertension Mother   . Asthma Mother   . Hypertension Father   . Asthma Sister   . Asthma Sister   . Asthma Daughter   . Tuberculosis Mother    History  Substance Use Topics  . Smoking status: Never Smoker   . Smokeless tobacco: Never Used  . Alcohol Use: No    Review of Systems ROS reviewed and all otherwise negative except for mentioned in HPI    Allergies  Penicillins and Zosyn  Home Medications   Prior to  Admission medications   Medication Sig Start Date End Date Taking? Authorizing Provider  acetaminophen (TYLENOL) 500 MG tablet Take 1,000 mg by mouth every 6 (six) hours as needed for mild pain.   Yes Historical Provider, MD  diazepam (VALIUM) 5 MG tablet Take 5 mg by mouth every 8 (eight) hours as needed. For muscle spasm   Yes Historical Provider, MD  furosemide (LASIX) 20 MG tablet Take 20 mg by mouth 2 (two) times daily.   Yes Historical Provider, MD  glimepiride (AMARYL) 4 MG tablet Take 4 mg by mouth daily with breakfast.   Yes Historical Provider, MD  HYDROcodone-acetaminophen (NORCO/VICODIN) 5-325 MG per tablet Take 1 tablet by mouth every 8 (eight) hours as needed for pain.   Yes Historical Provider, MD  ibuprofen (ADVIL,MOTRIN) 200 MG tablet Take 400 mg by mouth every 6 (six) hours as needed for moderate pain.   Yes Historical Provider, MD  indomethacin (INDOCIN) 25 MG capsule Take 25 mg by mouth daily as needed (gout). For 3 days   Yes Historical Provider, MD  losartan (COZAAR) 100 MG tablet Take 100 mg by mouth daily.   Yes Historical Provider, MD  lovastatin (MEVACOR) 40 MG tablet Take 40  mg by mouth at bedtime.   Yes Historical Provider, MD  metFORMIN (GLUCOPHAGE) 500 MG tablet Take 1,000 mg by mouth 2 (two) times daily with a meal.    Yes Historical Provider, MD  naproxen (NAPROSYN) 500 MG tablet Take 500 mg by mouth 2 (two) times daily with a meal.   Yes Historical Provider, MD  vardenafil (LEVITRA) 20 MG tablet Take 20 mg by mouth daily as needed for erectile dysfunction.   Yes Historical Provider, MD  doxycycline (VIBRAMYCIN) 50 MG capsule Take 2 capsules (100 mg total) by mouth 2 (two) times daily. 03/26/14   Melton Alar, PA-C  valACYclovir (VALTREX) 500 MG tablet Take 1 tablet (500 mg total) by mouth 2 (two) times daily as needed (for fever blisters). For 5 days 03/26/14   Melton Alar, PA-C   BP 123/78  Pulse 82  Temp(Src) 97.8 F (36.6 C) (Oral)  Resp 18  Ht 6' (1.829  m)  Wt 406 lb (184.16 kg)  BMI 55.05 kg/m2  SpO2 97% Vitals reviewed Physical Exam Physical Examination: General appearance - alert, well appearing, and in no distress Mental status - alert, oriented to person, place, and time Eyes - no conjunctival injection, no scleral icterus Mouth - mucous membranes moist, pharynx normal without lesions Chest - clear to auscultation, no wheezes, rales or rhonchi, symmetric air entry Heart - normal rate, regular rhythm, normal S1, S2, no murmurs, rubs, clicks or gallops Abdomen - soft, nontender, nondistended, no masses or organomegaly Extremities - peripheral pulses normal, no pedal edema, no clubbing or cyanosis Skin - normal coloration and turgor except area of ertyehma and warmth over right anterior tibial region approx 8-10cm  ED Course  Procedures (including critical care time)  4:46 AM d/w Dr. Blaine Hamper, [facility], for admission.  Pt to be admitted to med/surg bed.  Blood cultures and vancomycin ordered.   Labs Review Labs Reviewed  CBC WITH DIFFERENTIAL - Abnormal; Notable for the following:    HCT 37.7 (*)    Neutrophils Relative % 87 (*)    Neutro Abs 8.6 (*)    Lymphocytes Relative 8 (*)    All other components within normal limits  COMPREHENSIVE METABOLIC PANEL - Abnormal; Notable for the following:    Sodium 131 (*)    Chloride 95 (*)    Glucose, Bld 150 (*)    Albumin 2.7 (*)    GFR calc non Af Amer 64 (*)    GFR calc Af Amer 74 (*)    All other components within normal limits  HEMOGLOBIN A1C - Abnormal; Notable for the following:    Hemoglobin A1C 6.8 (*)    Mean Plasma Glucose 148 (*)    All other components within normal limits  PRO B NATRIURETIC PEPTIDE - Abnormal; Notable for the following:    Pro B Natriuretic peptide (BNP) 692.8 (*)    All other components within normal limits  GLUCOSE, CAPILLARY - Abnormal; Notable for the following:    Glucose-Capillary 174 (*)    All other components within normal limits  GLUCOSE,  CAPILLARY - Abnormal; Notable for the following:    Glucose-Capillary 179 (*)    All other components within normal limits  GLUCOSE, CAPILLARY - Abnormal; Notable for the following:    Glucose-Capillary 138 (*)    All other components within normal limits  CBC - Abnormal; Notable for the following:    RBC 4.20 (*)    Hemoglobin 12.4 (*)    HCT 36.8 (*)  All other components within normal limits  BASIC METABOLIC PANEL - Abnormal; Notable for the following:    Sodium 134 (*)    Glucose, Bld 163 (*)    Calcium 8.2 (*)    GFR calc non Af Amer 63 (*)    GFR calc Af Amer 73 (*)    All other components within normal limits  GLUCOSE, CAPILLARY - Abnormal; Notable for the following:    Glucose-Capillary 162 (*)    All other components within normal limits  GLUCOSE, CAPILLARY - Abnormal; Notable for the following:    Glucose-Capillary 172 (*)    All other components within normal limits  GLUCOSE, CAPILLARY - Abnormal; Notable for the following:    Glucose-Capillary 197 (*)    All other components within normal limits  GLUCOSE, CAPILLARY - Abnormal; Notable for the following:    Glucose-Capillary 158 (*)    All other components within normal limits  GLUCOSE, CAPILLARY - Abnormal; Notable for the following:    Glucose-Capillary 159 (*)    All other components within normal limits  GLUCOSE, CAPILLARY - Abnormal; Notable for the following:    Glucose-Capillary 165 (*)    All other components within normal limits  GLUCOSE, CAPILLARY - Abnormal; Notable for the following:    Glucose-Capillary 154 (*)    All other components within normal limits  CULTURE, BLOOD (ROUTINE X 2)  CULTURE, BLOOD (ROUTINE X 2)  TROPONIN I  I-STAT CG4 LACTIC ACID, ED    Imaging Review No results found.   EKG Interpretation None      MDM   Final diagnoses:  Cellulitis of right leg  Diabetes type 2, uncontrolled  Secondary hypertension, unspecified  Hyperlipemia  Morbid obesity  OSA on CPAP   Fever blister  Essential hypertension  Cellulitis of right anterior lower leg  SOB (shortness of breath)  Upper airway cough syndrome  Leg swelling    Pt with cellulitis of right lower leg, has failed outpatient clindamycin.  Pt started on IV vanc, admitted to hospitalist for further evaluation.   Doubt DVT as patient had fever and has had very similar symptoms with prior cellulitis.   Nursing notes including past medical history and social history reviewed and considered in documentation Prior records reviewed and considered during this visit     Threasa Beards, MD 03/30/14 1555

## 2014-03-24 NOTE — Progress Notes (Signed)
58 year old gentleman admitted for right leg cellulitis on IV vancomycin. Venous duplex and X RAY of the right leg pending.    Michael Poisson, MD [phone]

## 2014-03-24 NOTE — Progress Notes (Signed)
ANTIBIOTIC CONSULT NOTE - INITIAL  Pharmacy Consult for Vancomycin Indication: cellulitis  Allergies  Allergen Reactions  . Penicillins     Childhood allergy    Patient Measurements: Height: 6' (182.9 cm) Weight: 391 lb 14.4 oz (177.765 kg) IBW/kg (Calculated) : 77.6   Labs:  Recent Labs  03/24/14 0023  WBC 9.9  HGB 13.0  PLT 154  CREATININE 1.20    Medical History: Past Medical History  Diagnosis Date  . DVT (deep venous thrombosis) 2006    "after ankle surgery"  . Gout   . Obesity   . Hypertension   . Hypercholesteremia   . CHF (congestive heart failure)   . OSA on CPAP   . Peripheral edema     chronic  . Clotting disorder     deep vein thrombosis left leg  . Melanoma     left ear  . Shortness of breath   . Cellulitis and abscess of lower extremity 03/24/2014    RT LEG  . Diabetes mellitus     TYPE 2  . GERD (gastroesophageal reflux disease)   . Arthritis     RA    Assessment: 58 year old male to begin vancomycin for cellulitis. 1st dose pulled from ED pyxis   Goal of Therapy:  Vancomycin trough level 10-15 mcg/ml  Plan:  1) Vancomycin 1500 mg iv Q 12 hours (1st dose now -- not sure if ED vancomycin given) 2) Follow up Scr, cultures, progress  Thank you. Anette Guarneri, PharmD [phone]  Tad Moore 03/24/2014,2:47 PM

## 2014-03-24 NOTE — ED Notes (Signed)
Pt. reports progressing cellulitis at right lower leg with redness/swelling onset last Saturday with fever and chills , pt. currently taking Clindamycin oral antibiotic .

## 2014-03-25 DIAGNOSIS — M7989 Other specified soft tissue disorders: Secondary | ICD-10-CM

## 2014-03-25 DIAGNOSIS — M79609 Pain in unspecified limb: Secondary | ICD-10-CM

## 2014-03-25 DIAGNOSIS — I10 Essential (primary) hypertension: Secondary | ICD-10-CM

## 2014-03-25 DIAGNOSIS — E1165 Type 2 diabetes mellitus with hyperglycemia: Secondary | ICD-10-CM

## 2014-03-25 DIAGNOSIS — IMO0001 Reserved for inherently not codable concepts without codable children: Secondary | ICD-10-CM

## 2014-03-25 DIAGNOSIS — B001 Herpesviral vesicular dermatitis: Secondary | ICD-10-CM

## 2014-03-25 DIAGNOSIS — B009 Herpesviral infection, unspecified: Secondary | ICD-10-CM

## 2014-03-25 LAB — BASIC METABOLIC PANEL
Anion gap: 11 (ref 5–15)
BUN: 17 mg/dL (ref 6–23)
CO2: 24 mEq/L (ref 19–32)
CREATININE: 1.22 mg/dL (ref 0.50–1.35)
Calcium: 8.2 mg/dL — ABNORMAL LOW (ref 8.4–10.5)
Chloride: 99 mEq/L (ref 96–112)
GFR calc non Af Amer: 63 mL/min — ABNORMAL LOW (ref >90)
GFR, EST AFRICAN AMERICAN: 73 mL/min — AB (ref >90)
GLUCOSE: 163 mg/dL — AB (ref 70–99)
Potassium: 4 mEq/L (ref 3.7–5.3)
Sodium: 134 mEq/L — ABNORMAL LOW (ref 137–147)

## 2014-03-25 LAB — CBC
HEMATOCRIT: 36.8 % — AB (ref 39.0–52.0)
HEMOGLOBIN: 12.4 g/dL — AB (ref 13.0–17.0)
MCH: 29.5 pg (ref 26.0–34.0)
MCHC: 33.7 g/dL (ref 30.0–36.0)
MCV: 87.6 fL (ref 78.0–100.0)
Platelets: 151 10*3/uL (ref 150–400)
RBC: 4.2 MIL/uL — ABNORMAL LOW (ref 4.22–5.81)
RDW: 13.9 % (ref 11.5–15.5)
WBC: 6.5 10*3/uL (ref 4.0–10.5)

## 2014-03-25 LAB — GLUCOSE, CAPILLARY
GLUCOSE-CAPILLARY: 197 mg/dL — AB (ref 70–99)
GLUCOSE-CAPILLARY: 159 mg/dL — AB (ref 70–99)
GLUCOSE-CAPILLARY: 165 mg/dL — AB (ref 70–99)
Glucose-Capillary: 158 mg/dL — ABNORMAL HIGH (ref 70–99)

## 2014-03-25 MED ORDER — VALACYCLOVIR HCL 500 MG PO TABS
1000.0000 mg | ORAL_TABLET | Freq: Two times a day (BID) | ORAL | Status: DC
  Administered 2014-03-25 – 2014-03-26 (×3): 1000 mg via ORAL
  Filled 2014-03-25 (×4): qty 2

## 2014-03-25 MED ORDER — ONDANSETRON HCL 4 MG/2ML IJ SOLN
4.0000 mg | Freq: Four times a day (QID) | INTRAMUSCULAR | Status: DC | PRN
  Administered 2014-03-25 – 2014-03-26 (×2): 4 mg via INTRAVENOUS
  Filled 2014-03-25 (×2): qty 2

## 2014-03-25 NOTE — Progress Notes (Signed)
*  PRELIMINARY RESULTS* Vascular Ultrasound Right lower extremity venous duplex has been completed.  Preliminary findings: no evidence of DVT  Landry Mellow, RDMS, RVT  03/25/2014, 9:45 AM

## 2014-03-25 NOTE — Progress Notes (Addendum)
PROGRESS NOTE  Michael Martinez WCH:[id number] DOB: [birth year] DOA: 03/24/2014 PCP: Ashok Norris, MD  HPI/Subjective: Pt is a 58 y.o. male with a past medical history of uncontrolled diabetes, hypertension, hyperlipidemia, systolic congestive heart failure is admitted for cellulitis on his anterior right lower leg that first developed 03/21/14.  The patient has had cellulitis in this leg four times in the past year.  He was taking Clindamycin from his PCP and the swelling and redness continued to spread.  He also had a fever and chills which led him to coming to the ED.    On 03/25/14, the pt states that he is pain free.  He feels that the redness and swelling is subsiding.  He is complaining of some fever blisters that have appeared within the past day.     Assessment/Plan: Right leg cellulitis -Swelling and redness have decreased since initiation of IV Vancomycin -Continue IV Vancomycin-if clinical improvement continues, suspect home in am -Blood culture x 2 are pending -LE doppler has been ordered to assess vasculature.  Preliminary result is negative for DVT.  Fever blisters -Treat with Valtrex  Diabetes Mellitus - Type II -Hgb A1c on 03/24/14 was 6.8.  Improved from 11.2 on 11/21/13.  Pt is taking oral meds at home without using insulin. -Continue Lantus and Novolog in the hospital.  Chronic Systolic Congestive Heart Failure -BNP is elevated, but patient is stable.  Patient is taking Lasix 20 mg BID.  No signs of acute exacerbation on this admission. -Continue to monitor blood pressure.  Hypertension -BP well controlled on admission. -Continue Lasix and Cozaar and continue to monitor.    Obstructive Sleep Apnea -On CPAP at home.  Continue using CPAP qhs.  DVT Prophylaxis:  Heparin  Code Status: Full Family Communication: Patient alert and oriented and understands care plan (indicate person spoken with, relationship, and if by phone, the number) Disposition Plan: Home  tomorrow  Antibiotics:  Anti-infectives   Start     Dose/Rate Route Frequency Ordered Stop   03/25/14 1045  valACYclovir (VALTREX) tablet 1,000 mg     1,000 mg Oral 2 times daily 03/25/14 1031 03/30/14 0959   03/24/14 2100  valACYclovir (VALTREX) tablet 1,000 mg     1,000 mg Oral  Once 03/24/14 1958 03/24/14 2028   03/24/14 1530  vancomycin (VANCOCIN) 1,500 mg in sodium chloride 0.9 % 500 mL IVPB     1,500 mg 250 mL/hr over 120 Minutes Intravenous Every 12 hours 03/24/14 1450     03/24/14 0445  vancomycin (VANCOCIN) IVPB 1000 mg/200 mL premix     1,000 mg 200 mL/hr over 60 Minutes Intravenous  Once 03/24/14 0430      nti Objective: Filed Vitals:   03/24/14 0647 03/24/14 1419 03/24/14 2136 03/25/14 0501  BP: 123/75 119/79 116/73 132/74  Pulse: 91 93 92 84  Temp: 98.5 F (36.9 C) 97.5 F (36.4 C) 99 F (37.2 C) 98.7 F (37.1 C)  TempSrc: Oral Oral Oral Oral  Resp: 18 18 18 18   Height: 6' (1.829 m)     Weight: 177.765 kg (391 lb 14.4 oz)   179.624 kg (396 lb)  SpO2: 99% 95% 97% 100%    Intake/Output Summary (Last 24 hours) at 03/25/14 1126 Last data filed at 03/25/14 1044  Gross per 24 hour  Intake   1680 ml  Output      0 ml  Net   1680 ml   Filed Weights   03/24/14 0647 03/25/14 0501  Weight: 177.765 kg (  391 lb 14.4 oz) 179.624 kg (396 lb)    Exam: General: Obese, NAD, appears stated age  Cardiovascular: RRR, S1 S2 auscultated, no rubs, murmurs or gallops.   Respiratory: Diminished breath sounds bilaterally with equal chest rise  Abdomen: Obese.  Soft, nontender, nondistended, + bowel sounds.  No rebound tenderness.  Extremities: Right lower leg has redness, swelling, and tenderness anteriorly, without purulent discharge.  Redness and swelling has decreased from the surgical pen marking made upon arrival to hospital.  2+DP/PT pulse bilaterally. Neuro: AAOx3, cranial nerves grossly intact. Strength 5/5 in lower extremities  Psych: Normal affect and demeanor with  intact judgement and insight  Data Reviewed: Basic Metabolic Panel:  Recent Labs Lab 03/24/14 0023 03/25/14 0618  NA 131* 134*  K 3.8 4.0  CL 95* 99  CO2 21 24  GLUCOSE 150* 163*  BUN 18 17  CREATININE 1.20 1.22  CALCIUM 8.4 8.2*   Liver Function Tests:  Recent Labs Lab 03/24/14 0023  AST 18  ALT 22  ALKPHOS 68  BILITOT 0.5  PROT 6.2  ALBUMIN 2.7*   CBC:  Recent Labs Lab 03/24/14 0023 03/25/14 0618  WBC 9.9 6.5  NEUTROABS 8.6*  --   HGB 13.0 12.4*  HCT 37.7* 36.8*  MCV 84.7 87.6  PLT 154 151   Cardiac Enzymes:  Recent Labs Lab 03/24/14 0852  TROPONINI <0.30   BNP (last 3 results)  Recent Labs  05/07/13 1012 10/16/13 1407 03/24/14 0852  PROBNP 633.5* 228.6* 692.8*   CBG:  Recent Labs Lab 03/24/14 0759 03/24/14 1214 03/24/14 1652 03/24/14 2134 03/25/14 0749  GLUCAP 179* 138* 162* 172* 197*    Recent Results (from the past 240 hour(s))  CULTURE, BLOOD (ROUTINE X 2)     Status: None   Collection Time    03/24/14  5:55 AM      Result Value Ref Range Status   Specimen Description BLOOD FOREARM RIGHT   Final   Special Requests BOTTLES DRAWN AEROBIC ONLY 3ML   Final   Culture  Setup Time     Final   Value: 03/24/2014 14:03     Performed at [facility]   Culture     Final   Value:        BLOOD CULTURE RECEIVED NO GROWTH TO DATE CULTURE WILL BE HELD FOR 5 DAYS BEFORE ISSUING A FINAL NEGATIVE REPORT     Performed at [facility]   Report Status PENDING   Incomplete  CULTURE, BLOOD (ROUTINE X 2)     Status: None   Collection Time    03/24/14  5:55 AM      Result Value Ref Range Status   Specimen Description BLOOD HAND RIGHT   Final   Special Requests BOTTLES DRAWN AEROBIC ONLY 1.5ML   Final   Culture  Setup Time     Final   Value: 03/24/2014 14:03     Performed at [facility]   Culture     Final   Value:        BLOOD CULTURE RECEIVED NO GROWTH TO DATE CULTURE WILL BE HELD FOR 5 DAYS BEFORE ISSUING A FINAL  NEGATIVE REPORT     Note: Culture results may be compromised due to an inadequate volume of blood received in culture bottles.     Performed at [facility]   Report Status PENDING   Incomplete    Scheduled Meds: . furosemide  20 mg Oral BID  . heparin  5,000  Units Subcutaneous 3 times per day  . Influenza vac split quadrivalent PF  0.5 mL Intramuscular Tomorrow-1000  . insulin aspart  0-9 Units Subcutaneous TID WC  . insulin glargine  10 Units Subcutaneous Daily  . losartan  100 mg Oral Daily  . simvastatin  20 mg Oral q1800  . sodium chloride  3 mL Intravenous Q12H  . sodium chloride  3 mL Intravenous Q12H  . valACYclovir  1,000 mg Oral BID  . vancomycin  1,500 mg Intravenous Q12H  . vancomycin  1,000 mg Intravenous Once   Continuous Infusions:   Principal Problem:   Cellulitis of right leg Active Problems:   HTN (hypertension)   OSA on CPAP   Hyperlipemia   Diabetes type 2, uncontrolled   Fever blister  Rockwell Germany, PA-S Imogene Burn, PA-C  [facility] Pager [phone]. If 7PM-7AM, please contact night-coverage at [email or web address], password [facility] 03/25/2014, 11:26 AM  LOS: 1 day   Attending Patient was seen, examined,treatment plan was discussed with the Physician extender. I have directly reviewed the clinical findings, lab, imaging studies and management of this patient in detail. I have made the necessary changes to the above noted documentation, and agree with the documentation, as recorded by the Physician extender.  Nena Alexander MD [facility].

## 2014-03-26 LAB — GLUCOSE, CAPILLARY: Glucose-Capillary: 154 mg/dL — ABNORMAL HIGH (ref 70–99)

## 2014-03-26 MED ORDER — VALACYCLOVIR HCL 500 MG PO TABS: 500.0000 mg | ORAL_TABLET | Freq: Two times a day (BID) | ORAL | Status: DC | PRN

## 2014-03-26 MED ORDER — DOXYCYCLINE HYCLATE 50 MG PO CAPS: 100.0000 mg | ORAL_CAPSULE | Freq: Two times a day (BID) | ORAL | Status: DC

## 2014-03-26 NOTE — Discharge Summary (Signed)
Physician Discharge Summary  Michael Martinez:[id number] DOB: April 29, 1956 DOA: 03/24/2014  PCP: Ashok Norris, MD  Admit date: 03/24/2014 Discharge date: 03/26/2014  Time spent: 40 minutes  Recommendations for Outpatient Follow-up:  1. CBC and BMET in one week.  Please check finalized blood cultures. 2. Discharged on Doxycycline for cellulitis.   3. Consider referral to vascular for recurrent cellulitis.  Discharge Diagnoses:  Principal Problem:   Cellulitis of right leg Active Problems:   HTN (hypertension)   OSA on CPAP   Hyperlipemia   Diabetes type 2, uncontrolled   Fever blister   Discharge Condition: stable  Diet recommendation: Diabetic diet  History of present illness:  Pt is a 58 y.o. Male with a past medical history of uncontrolled diabetes, hypertension, hyperlipidemia, systolic congestive heart failure is admitted for cellulitis on his anterior right lower leg that first developed 03/21/14. The patient has had cellulitis in this leg four times in the past year. He was taking Clindamycin from his PCP and the swelling and redness continued to spread. He also had a fever and chills which led him to come to the ED.  Hospital Course:  Right leg cellulitis  -Swelling, redness, and fever have decreased with two days of IV Vancomycin-and now significantly improved, will switch to oral Doxycycline on discharge -Blood culture x 2 are pending, but show no growth to date. -LE doppler was ordered to assess vasculature. Preliminary result was negative for DVT.   Fever blisters  -Treated with Valtrex in the hospital.  Sent home with a prescription of Valtrex.   Diabetes Mellitus - Type II  -Hgb A1c on 03/24/14 was 6.8. Improved from 11.2 on 11/21/13.  -Lantus and Novolog were taken in the hospital.   -Pt is taking oral meds at home without using insulin.   Chronic Systolic Congestive Heart Failure  -BNP is elevated, but patient is stable. Patient is taking Lasix 20 mg BID.  -No  signs of acute exacerbation on this admission.   Hypertension  -BP well controlled on admission.  -Lasix and Cozaar continued in the hospital.  Blood pressure remained stable during hospital stay.     Obstructive Sleep Apnea  -On CPAP at home. Continued using CPAP qhs in hospital.   Procedures:  Doppler Ultrasound - negative for DVT  Discharge Exam: Filed Vitals:   03/26/14 0536  BP: 123/78  Pulse: 82  Temp: 97.8 F (36.6 C)  Resp: 18   General: Obese male sitting comfortably in chair.  NAD.  Appears stated age.   Cardiovascular: RRR, S1 S2 auscultated, no rubs, murmurs or gallops.  Respiratory: Diminished breath sounds bilaterally with equal chest rise  Abdomen: Obese. Soft, nontender, nondistended, + bowel sounds. No rebound tenderness.  Extremities: Right lower leg has redness, swelling, and tenderness anteriorly, without purulent discharge. Redness and swelling has continued to decrease from the surgical pen marking made upon arrival to hospital. Lower extremity edema present bilaterally. Psych: Normal affect and demeanor with intact judgement and insight  Filed Weights   03/24/14 0647 03/25/14 0501 03/26/14 0536  Weight: 177.765 kg (391 lb 14.4 oz) 179.624 kg (396 lb) 184.16 kg (406 lb)   Discharge Instructions Discharge Instructions   Diet - low sodium heart healthy    Complete by:  As directed      Increase activity slowly    Complete by:  As directed           Current Discharge Medication List    START taking these medications   Details  doxycycline (VIBRAMYCIN) 50 MG capsule Take 2 capsules (100 mg total) by mouth 2 (two) times daily. Qty: 36 capsule, Refills: 0      CONTINUE these medications which have CHANGED   Details  valACYclovir (VALTREX) 500 MG tablet Take 1 tablet (500 mg total) by mouth 2 (two) times daily as needed (for fever blisters). For 5 days Qty: 20 tablet, Refills: 0      CONTINUE these medications which have NOT CHANGED   Details    acetaminophen (TYLENOL) 500 MG tablet Take 1,000 mg by mouth every 6 (six) hours as needed for mild pain.    diazepam (VALIUM) 5 MG tablet Take 5 mg by mouth every 8 (eight) hours as needed. For muscle spasm    furosemide (LASIX) 20 MG tablet Take 20 mg by mouth 2 (two) times daily.    glimepiride (AMARYL) 4 MG tablet Take 4 mg by mouth daily with breakfast.    HYDROcodone-acetaminophen (NORCO/VICODIN) 5-325 MG per tablet Take 1 tablet by mouth every 8 (eight) hours as needed for pain.    ibuprofen (ADVIL,MOTRIN) 200 MG tablet Take 400 mg by mouth every 6 (six) hours as needed for moderate pain.    indomethacin (INDOCIN) 25 MG capsule Take 25 mg by mouth daily as needed (gout). For 3 days    losartan (COZAAR) 100 MG tablet Take 100 mg by mouth daily.    lovastatin (MEVACOR) 40 MG tablet Take 40 mg by mouth at bedtime.    metFORMIN (GLUCOPHAGE) 500 MG tablet Take 1,000 mg by mouth 2 (two) times daily with a meal.     naproxen (NAPROSYN) 500 MG tablet Take 500 mg by mouth 2 (two) times daily with a meal.    vardenafil (LEVITRA) 20 MG tablet Take 20 mg by mouth daily as needed for erectile dysfunction.      STOP taking these medications     clindamycin (CLEOCIN) 150 MG capsule        Allergies  Allergen Reactions  . Penicillins     Childhood allergy   Follow-up Information   Follow up with MORRISEY,LEMONT, MD. Schedule an appointment as soon as possible for a visit on 04/16/2014. (Appointment with Dr. Rutherford Nail is on Oct 1st at 10:15)    Specialty:  Family Medicine   Contact information:   [facility] [address] [place] [address] [phone]      The results of significant diagnostics from this hospitalization (including imaging, microbiology, ancillary and laboratory) are listed below for reference.    Microbiology: Recent Results (from the past 240 hour(s))  CULTURE, BLOOD (ROUTINE X 2)     Status: None   Collection Time     03/24/14  5:55 AM      Result Value Ref Range Status   Specimen Description BLOOD FOREARM RIGHT   Final   Special Requests BOTTLES DRAWN AEROBIC ONLY 3ML   Final   Culture  Setup Time     Final   Value: 03/24/2014 14:03     Performed at [facility]   Culture     Final   Value:        BLOOD CULTURE RECEIVED NO GROWTH TO DATE CULTURE WILL BE HELD FOR 5 DAYS BEFORE ISSUING A FINAL NEGATIVE REPORT     Performed at [facility]   Report Status PENDING   Incomplete  CULTURE, BLOOD (ROUTINE X 2)     Status: None   Collection Time    03/24/14  5:55 AM      Result Value Ref Range Status   Specimen Description BLOOD HAND RIGHT   Final   Special Requests BOTTLES DRAWN AEROBIC ONLY 1.5ML   Final   Culture  Setup Time     Final   Value: 03/24/2014 14:03     Performed at [facility]   Culture     Final   Value:        BLOOD CULTURE RECEIVED NO GROWTH TO DATE CULTURE WILL BE HELD FOR 5 DAYS BEFORE ISSUING A FINAL NEGATIVE REPORT     Note: Culture results may be compromised due to an inadequate volume of blood received in culture bottles.     Performed at [facility]   Report Status PENDING   Incomplete     Labs: Basic Metabolic Panel:  Recent Labs Lab 03/24/14 0023 03/25/14 0618  NA 131* 134*  K 3.8 4.0  CL 95* 99  CO2 21 24  GLUCOSE 150* 163*  BUN 18 17  CREATININE 1.20 1.22  CALCIUM 8.4 8.2*   Liver Function Tests:  Recent Labs Lab 03/24/14 0023  AST 18  ALT 22  ALKPHOS 68  BILITOT 0.5  PROT 6.2  ALBUMIN 2.7*   CBC:  Recent Labs Lab 03/24/14 0023 03/25/14 0618  WBC 9.9 6.5  NEUTROABS 8.6*  --   HGB 13.0 12.4*  HCT 37.7* 36.8*  MCV 84.7 87.6  PLT 154 151   Cardiac Enzymes:  Recent Labs Lab 03/24/14 0852  TROPONINI <0.30   BNP: BNP (last 3 results)  Recent Labs  05/07/13 1012 10/16/13 1407 03/24/14 0852  PROBNP 633.5* 228.6* 692.8*   CBG:  Recent Labs Lab 03/25/14 0749 03/25/14 1205 03/25/14 1645  03/25/14 2218 03/26/14 0749  GLUCAP 197* 158* 159* 165* 154*         Ref Range 2d ago  32mo ago  4yr ago     Hemoglobin A1C <5.7 % 6.8 (H)  11.2 (H) R, CM 7.0 (H) CM       Signed:  Sheffield Slider [phone] [facility] 03/26/2014, 11:05 AM   Attending Patient was seen, examined,treatment plan was discussed with the Physician extender. I have directly reviewed the clinical findings, lab, imaging studies and management of this patient in detail. I have made the necessary changes to the above noted documentation, and agree with the documentation, as recorded by the Physician extender.  Nena Alexander MD [facility].

## 2014-03-26 NOTE — Discharge Instructions (Signed)
Follow up with PCP within the next week. Continue to use your compression stockings at home.  Elevate your leg as the cellulitis continues to heal.  When traveling to [place], stop every 1-2 hours and walk around to improve circulation.   Keep up the good work with your diabetes.

## 2014-03-26 NOTE — Care Management Note (Signed)
    Page 1 of 1   03/26/2014     10:50:25 AM CARE MANAGEMENT NOTE 03/26/2014  Patient:  Michael Martinez, Michael Martinez   Account Number:  [id number]  Date Initiated:  03/24/2014  Documentation initiated by:  [facility]  Subjective/Objective Assessment:   cellulitis     Action/Plan:   Anticipated DC Date:  03/26/2014   Anticipated DC Plan:  [place]  CM consult      Choice offered to / List presented to:             Status of service:  Completed, signed off Medicare Important Message given?  NO (If response is "NO", the following Medicare IM given date fields will be blank) Date Medicare IM given:   Medicare IM given by:   Date Additional Medicare IM given:   Additional Medicare IM given by:    Discharge Disposition:  HOME/SELF CARE  Per UR Regulation:  Reviewed for med. necessity/level of care/duration of stay  If discussed at [place] of Stay Meetings, dates discussed:    Comments:  03/26/14  [place], BSN [phone] patient is for dc today, NCM spoke with patient, he states he has a PCP, Dr. Ashok Norris and he will continue to go see him and he does not have any problems getting his medications.  No further needs anticipated.

## 2014-03-29 ENCOUNTER — Inpatient Hospital Stay (HOSPITAL_COMMUNITY)
Admission: EM | Admit: 2014-03-29 | Discharge: 2014-04-01 | DRG: 602 | Disposition: A | Discharge status: Home / Self Care | Payer: MEDICAID | Attending: Internal Medicine | Admitting: Internal Medicine

## 2014-03-29 ENCOUNTER — Encounter (HOSPITAL_COMMUNITY): Payer: Self-pay | Admitting: Emergency Medicine

## 2014-03-29 DIAGNOSIS — L0390 Cellulitis, unspecified: Secondary | ICD-10-CM | POA: Diagnosis present

## 2014-03-29 DIAGNOSIS — E119 Type 2 diabetes mellitus without complications: Secondary | ICD-10-CM | POA: Diagnosis present

## 2014-03-29 DIAGNOSIS — K219 Gastro-esophageal reflux disease without esophagitis: Secondary | ICD-10-CM | POA: Diagnosis present

## 2014-03-29 DIAGNOSIS — Z9989 Dependence on other enabling machines and devices: Secondary | ICD-10-CM

## 2014-03-29 DIAGNOSIS — M069 Rheumatoid arthritis, unspecified: Secondary | ICD-10-CM | POA: Diagnosis present

## 2014-03-29 DIAGNOSIS — I5043 Acute on chronic combined systolic (congestive) and diastolic (congestive) heart failure: Secondary | ICD-10-CM | POA: Diagnosis present

## 2014-03-29 DIAGNOSIS — B009 Herpesviral infection, unspecified: Secondary | ICD-10-CM | POA: Diagnosis present

## 2014-03-29 DIAGNOSIS — G4733 Obstructive sleep apnea (adult) (pediatric): Secondary | ICD-10-CM | POA: Diagnosis present

## 2014-03-29 DIAGNOSIS — R058 Other specified cough: Secondary | ICD-10-CM

## 2014-03-29 DIAGNOSIS — I10 Essential (primary) hypertension: Secondary | ICD-10-CM | POA: Diagnosis present

## 2014-03-29 DIAGNOSIS — N390 Urinary tract infection, site not specified: Secondary | ICD-10-CM | POA: Diagnosis present

## 2014-03-29 DIAGNOSIS — E1165 Type 2 diabetes mellitus with hyperglycemia: Secondary | ICD-10-CM

## 2014-03-29 DIAGNOSIS — Z85820 Personal history of malignant melanoma of skin: Secondary | ICD-10-CM

## 2014-03-29 DIAGNOSIS — I509 Heart failure, unspecified: Secondary | ICD-10-CM | POA: Diagnosis present

## 2014-03-29 DIAGNOSIS — L03116 Cellulitis of left lower limb: Secondary | ICD-10-CM

## 2014-03-29 DIAGNOSIS — L03119 Cellulitis of unspecified part of limb: Principal | ICD-10-CM

## 2014-03-29 DIAGNOSIS — Z79899 Other long term (current) drug therapy: Secondary | ICD-10-CM

## 2014-03-29 DIAGNOSIS — R0602 Shortness of breath: Secondary | ICD-10-CM

## 2014-03-29 DIAGNOSIS — Z880 Allergy status to penicillin: Secondary | ICD-10-CM

## 2014-03-29 DIAGNOSIS — IMO0002 Reserved for concepts with insufficient information to code with codable children: Secondary | ICD-10-CM

## 2014-03-29 DIAGNOSIS — L03115 Cellulitis of right lower limb: Secondary | ICD-10-CM

## 2014-03-29 DIAGNOSIS — M109 Gout, unspecified: Secondary | ICD-10-CM | POA: Diagnosis present

## 2014-03-29 DIAGNOSIS — E785 Hyperlipidemia, unspecified: Secondary | ICD-10-CM | POA: Diagnosis present

## 2014-03-29 DIAGNOSIS — Z86718 Personal history of other venous thrombosis and embolism: Secondary | ICD-10-CM

## 2014-03-29 DIAGNOSIS — R05 Cough: Secondary | ICD-10-CM

## 2014-03-29 DIAGNOSIS — M7989 Other specified soft tissue disorders: Secondary | ICD-10-CM

## 2014-03-29 DIAGNOSIS — B001 Herpesviral vesicular dermatitis: Secondary | ICD-10-CM

## 2014-03-29 DIAGNOSIS — L02419 Cutaneous abscess of limb, unspecified: Principal | ICD-10-CM | POA: Diagnosis present

## 2014-03-29 DIAGNOSIS — T782XXA Anaphylactic shock, unspecified, initial encounter: Secondary | ICD-10-CM

## 2014-03-29 DIAGNOSIS — Z6841 Body Mass Index (BMI) 40.0 and over, adult: Secondary | ICD-10-CM

## 2014-03-29 DIAGNOSIS — Z23 Encounter for immunization: Secondary | ICD-10-CM

## 2014-03-29 LAB — URINALYSIS, ROUTINE W REFLEX MICROSCOPIC
Bilirubin Urine: NEGATIVE
Glucose, UA: 250 mg/dL — AB
Hgb urine dipstick: NEGATIVE
KETONES UR: NEGATIVE mg/dL
NITRITE: NEGATIVE
PH: 7 (ref 5.0–8.0)
Protein, ur: NEGATIVE mg/dL
Specific Gravity, Urine: 1.014 (ref 1.005–1.030)
Urobilinogen, UA: 1 mg/dL (ref 0.0–1.0)

## 2014-03-29 LAB — CREATININE, SERUM
Creatinine, Ser: 1.1 mg/dL (ref 0.50–1.35)
GFR calc non Af Amer: 71 mL/min — ABNORMAL LOW (ref >90)
GFR, EST AFRICAN AMERICAN: 82 mL/min — AB (ref >90)

## 2014-03-29 LAB — COMPREHENSIVE METABOLIC PANEL
ALBUMIN: 3 g/dL — AB (ref 3.5–5.2)
ALT: 36 U/L (ref 0–53)
AST: 19 U/L (ref 0–37)
Alkaline Phosphatase: 100 U/L (ref 39–117)
Anion gap: 12 (ref 5–15)
BILIRUBIN TOTAL: 0.9 mg/dL (ref 0.3–1.2)
BUN: 14 mg/dL (ref 6–23)
CALCIUM: 8.9 mg/dL (ref 8.4–10.5)
CHLORIDE: 92 meq/L — AB (ref 96–112)
CO2: 27 mEq/L (ref 19–32)
CREATININE: 1.11 mg/dL (ref 0.50–1.35)
GFR calc Af Amer: 82 mL/min — ABNORMAL LOW (ref >90)
GFR calc non Af Amer: 70 mL/min — ABNORMAL LOW (ref >90)
Glucose, Bld: 224 mg/dL — ABNORMAL HIGH (ref 70–99)
Potassium: 4.5 mEq/L (ref 3.7–5.3)
Sodium: 131 mEq/L — ABNORMAL LOW (ref 137–147)
Total Protein: 6.9 g/dL (ref 6.0–8.3)

## 2014-03-29 LAB — I-STAT CG4 LACTIC ACID, ED: LACTIC ACID, VENOUS: 1.89 mmol/L (ref 0.5–2.2)

## 2014-03-29 LAB — CBC
HCT: 38.9 % — ABNORMAL LOW (ref 39.0–52.0)
Hemoglobin: 13.4 g/dL (ref 13.0–17.0)
MCH: 29.2 pg (ref 26.0–34.0)
MCHC: 34.4 g/dL (ref 30.0–36.0)
MCV: 84.7 fL (ref 78.0–100.0)
PLATELETS: 265 10*3/uL (ref 150–400)
RBC: 4.59 MIL/uL (ref 4.22–5.81)
RDW: 13.5 % (ref 11.5–15.5)
WBC: 14.7 10*3/uL — ABNORMAL HIGH (ref 4.0–10.5)

## 2014-03-29 LAB — CBC WITH DIFFERENTIAL/PLATELET
BASOS ABS: 0 10*3/uL (ref 0.0–0.1)
BASOS PCT: 0 % (ref 0–1)
EOS PCT: 1 % (ref 0–5)
Eosinophils Absolute: 0.1 10*3/uL (ref 0.0–0.7)
HEMATOCRIT: 38.9 % — AB (ref 39.0–52.0)
Hemoglobin: 13.2 g/dL (ref 13.0–17.0)
LYMPHS PCT: 8 % — AB (ref 12–46)
Lymphs Abs: 1 10*3/uL (ref 0.7–4.0)
MCH: 29.1 pg (ref 26.0–34.0)
MCHC: 33.9 g/dL (ref 30.0–36.0)
MCV: 85.7 fL (ref 78.0–100.0)
MONO ABS: 0.7 10*3/uL (ref 0.1–1.0)
Monocytes Relative: 6 % (ref 3–12)
Neutro Abs: 10.7 10*3/uL — ABNORMAL HIGH (ref 1.7–7.7)
Neutrophils Relative %: 85 % — ABNORMAL HIGH (ref 43–77)
PLATELETS: 266 10*3/uL (ref 150–400)
RBC: 4.54 MIL/uL (ref 4.22–5.81)
RDW: 13.5 % (ref 11.5–15.5)
WBC: 12.5 10*3/uL — AB (ref 4.0–10.5)

## 2014-03-29 LAB — HEPATIC FUNCTION PANEL
ALK PHOS: 101 U/L (ref 39–117)
ALT: 32 U/L (ref 0–53)
AST: 18 U/L (ref 0–37)
Albumin: 2.8 g/dL — ABNORMAL LOW (ref 3.5–5.2)
BILIRUBIN DIRECT: 0.2 mg/dL (ref 0.0–0.3)
Indirect Bilirubin: 0.8 mg/dL (ref 0.3–0.9)
Total Bilirubin: 1 mg/dL (ref 0.3–1.2)
Total Protein: 6.7 g/dL (ref 6.0–8.3)

## 2014-03-29 LAB — MAGNESIUM: MAGNESIUM: 1.7 mg/dL (ref 1.5–2.5)

## 2014-03-29 LAB — URINE MICROSCOPIC-ADD ON

## 2014-03-29 LAB — TSH: TSH: 1.34 u[IU]/mL (ref 0.350–4.500)

## 2014-03-29 LAB — GLUCOSE, CAPILLARY
Glucose-Capillary: 246 mg/dL — ABNORMAL HIGH (ref 70–99)
Glucose-Capillary: 335 mg/dL — ABNORMAL HIGH (ref 70–99)

## 2014-03-29 LAB — HEMOGLOBIN A1C
Hgb A1c MFr Bld: 6.9 % — ABNORMAL HIGH (ref <5.7)
Mean Plasma Glucose: 151 mg/dL — ABNORMAL HIGH (ref <117)

## 2014-03-29 LAB — MRSA PCR SCREENING: MRSA BY PCR: NEGATIVE

## 2014-03-29 MED ORDER — ONDANSETRON HCL 4 MG/2ML IJ SOLN
4.0000 mg | Freq: Four times a day (QID) | INTRAMUSCULAR | Status: DC | PRN
  Administered 2014-03-29 – 2014-03-30 (×2): 4 mg via INTRAVENOUS
  Filled 2014-03-29 (×2): qty 2

## 2014-03-29 MED ORDER — PIPERACILLIN-TAZOBACTAM 3.375 G IVPB: 3.3750 g | Freq: Three times a day (TID) | INTRAVENOUS | Status: DC

## 2014-03-29 MED ORDER — VANCOMYCIN HCL 10 G IV SOLR
1500.0000 mg | Freq: Two times a day (BID) | INTRAVENOUS | Status: DC
  Administered 2014-03-30 – 2014-03-31 (×3): 1500 mg via INTRAVENOUS
  Filled 2014-03-29 (×6): qty 1500

## 2014-03-29 MED ORDER — VANCOMYCIN HCL 10 G IV SOLR
2500.0000 mg | Freq: Once | INTRAVENOUS | Status: AC
  Administered 2014-03-29: 2500 mg via INTRAVENOUS
  Filled 2014-03-29: qty 2500

## 2014-03-29 MED ORDER — ENOXAPARIN SODIUM 40 MG/0.4ML ~~LOC~~ SOLN
40.0000 mg | SUBCUTANEOUS | Status: DC
  Administered 2014-03-29 – 2014-03-30 (×2): 40 mg via SUBCUTANEOUS
  Filled 2014-03-29: qty 0.4

## 2014-03-29 MED ORDER — FAMOTIDINE IN NACL 20-0.9 MG/50ML-% IV SOLN
20.0000 mg | Freq: Once | INTRAVENOUS | Status: AC
  Administered 2014-03-29: 20 mg via INTRAVENOUS
  Filled 2014-03-29: qty 50

## 2014-03-29 MED ORDER — PNEUMOCOCCAL VAC POLYVALENT 25 MCG/0.5ML IJ INJ
0.5000 mL | INJECTION | INTRAMUSCULAR | Status: AC
  Administered 2014-03-30: 0.5 mL via INTRAMUSCULAR
  Filled 2014-03-29: qty 0.5

## 2014-03-29 MED ORDER — ACETAMINOPHEN 650 MG RE SUPP: 650.0000 mg | Freq: Four times a day (QID) | RECTAL | Status: DC | PRN

## 2014-03-29 MED ORDER — SODIUM CHLORIDE 0.9 % IV SOLN
INTRAVENOUS | Status: AC
  Administered 2014-03-29 (×2): via INTRAVENOUS

## 2014-03-29 MED ORDER — DIAZEPAM 5 MG PO TABS: 5.0000 mg | ORAL_TABLET | Freq: Three times a day (TID) | ORAL | Status: DC | PRN

## 2014-03-29 MED ORDER — PIPERACILLIN-TAZOBACTAM 3.375 G IVPB 30 MIN
3.3750 g | Freq: Once | INTRAVENOUS | Status: AC
  Administered 2014-03-29: 3.375 g via INTRAVENOUS
  Filled 2014-03-29: qty 50

## 2014-03-29 MED ORDER — GLIMEPIRIDE 4 MG PO TABS
4.0000 mg | ORAL_TABLET | Freq: Every day | ORAL | Status: DC
  Administered 2014-03-30 – 2014-04-01 (×3): 4 mg via ORAL
  Filled 2014-03-29 (×5): qty 1

## 2014-03-29 MED ORDER — ACETAMINOPHEN 325 MG PO TABS
650.0000 mg | ORAL_TABLET | Freq: Four times a day (QID) | ORAL | Status: DC | PRN
  Administered 2014-03-29 – 2014-04-01 (×4): 650 mg via ORAL
  Filled 2014-03-29 (×4): qty 2

## 2014-03-29 MED ORDER — ONDANSETRON HCL 4 MG PO TABS: 4.0000 mg | ORAL_TABLET | Freq: Four times a day (QID) | ORAL | Status: DC | PRN

## 2014-03-29 MED ORDER — OXYCODONE HCL 5 MG PO TABS
5.0000 mg | ORAL_TABLET | ORAL | Status: DC | PRN
  Administered 2014-03-31: 5 mg via ORAL
  Filled 2014-03-29 (×2): qty 1

## 2014-03-29 MED ORDER — SODIUM CHLORIDE 0.9 % IV BOLUS (SEPSIS)
500.0000 mL | Freq: Once | INTRAVENOUS | Status: AC
  Administered 2014-03-29: 500 mL via INTRAVENOUS

## 2014-03-29 MED ORDER — METHYLPREDNISOLONE SODIUM SUCC 125 MG IJ SOLR
125.0000 mg | Freq: Once | INTRAMUSCULAR | Status: AC
  Administered 2014-03-29: 125 mg via INTRAVENOUS
  Filled 2014-03-29: qty 2

## 2014-03-29 MED ORDER — INSULIN ASPART 100 UNIT/ML ~~LOC~~ SOLN
0.0000 [IU] | Freq: Three times a day (TID) | SUBCUTANEOUS | Status: DC
  Administered 2014-03-29: 5 [IU] via SUBCUTANEOUS

## 2014-03-29 MED ORDER — VALACYCLOVIR HCL 500 MG PO TABS
500.0000 mg | ORAL_TABLET | Freq: Two times a day (BID) | ORAL | Status: DC | PRN
  Filled 2014-03-29: qty 1

## 2014-03-29 MED ORDER — DIPHENHYDRAMINE HCL 50 MG/ML IJ SOLN
50.0000 mg | Freq: Every evening | INTRAMUSCULAR | Status: DC | PRN
  Administered 2014-03-29: 50 mg via INTRAVENOUS
  Filled 2014-03-29: qty 1

## 2014-03-29 MED ORDER — CLINDAMYCIN PHOSPHATE 600 MG/50ML IV SOLN
600.0000 mg | Freq: Once | INTRAVENOUS | Status: AC
  Administered 2014-03-29: 600 mg via INTRAVENOUS
  Filled 2014-03-29: qty 50

## 2014-03-29 MED ORDER — FUROSEMIDE 10 MG/ML IJ SOLN
40.0000 mg | Freq: Every day | INTRAMUSCULAR | Status: DC
  Administered 2014-03-29: 40 mg via INTRAVENOUS
  Filled 2014-03-29: qty 4

## 2014-03-29 MED ORDER — ACETAMINOPHEN 325 MG PO TABS
650.0000 mg | ORAL_TABLET | Freq: Once | ORAL | Status: AC
  Administered 2014-03-29: 650 mg via ORAL
  Filled 2014-03-29: qty 2

## 2014-03-29 MED ORDER — SODIUM CHLORIDE 0.9 % IJ SOLN
3.0000 mL | Freq: Two times a day (BID) | INTRAMUSCULAR | Status: DC
  Administered 2014-03-30 – 2014-04-01 (×4): 3 mL via INTRAVENOUS

## 2014-03-29 MED ORDER — IMIPENEM-CILASTATIN 500 MG IV SOLR
500.0000 mg | Freq: Four times a day (QID) | INTRAVENOUS | Status: DC
  Administered 2014-03-29 – 2014-04-01 (×11): 500 mg via INTRAVENOUS
  Filled 2014-03-29 (×16): qty 500

## 2014-03-29 MED ORDER — SIMVASTATIN 5 MG PO TABS
5.0000 mg | ORAL_TABLET | Freq: Every day | ORAL | Status: DC
  Administered 2014-03-29 – 2014-03-31 (×3): 5 mg via ORAL
  Filled 2014-03-29 (×4): qty 1

## 2014-03-29 NOTE — H&P (Signed)
[facility] History and Physical  Michael Martinez YIR:[id number] DOB: 1955/11/30 DOA: 03/29/2014  Referring physician:  PCP: Ashok Norris, MD   Chief Complaint: Cellulitis  HPI:  Michael Martinez is a 58 y.o. male with history of gout, DVT, CHF, recurrent cellulitis of right leg, who presents to ED. With complaint fever, generalized malaise. Pt recently admitted to the hospital, discharged 3 days ago. Was treated for cellulitis of right lower leg. Doppler was negative for DVT on 9/9. Patient was treated on vancomycin while in the hospital, discharged with doxycycline. Patient states when he left the hospital he was feeling much better. States he has been taking antibiotics as prescribed. Yesterday he developed fever again of 102, chills, rigors. Comes back in with worsening fever redness despite taking oral antibiotics.The patient has had cellulitis in this leg four times in the past year     Review of Systems: negative for the following  Constitutional: Positive for fatigue. Negative for fever and chills.  Respiratory: Negative for cough, chest tightness and shortness of breath.  Cardiovascular: Negative for chest pain, palpitations and leg swelling.  Gastrointestinal: Negative for nausea, vomiting, abdominal pain, diarrhea and abdominal distention.  Genitourinary: Positive for difficulty urinating. Negative for dysuria, urgency, frequency, hematuria and flank pain.  Musculoskeletal: Positive for myalgias. Negative for neck pain and neck stiffness.  Skin: Negative for rash.  Allergic/Immunologic: Negative for immunocompromised state.  Neurological: Positive for weakness. Negative for dizziness, light-headedness, numbness and headaches    Past Medical History  Diagnosis Date  . DVT (deep venous thrombosis) 2006    "after ankle surgery"  . Gout   . Obesity   . Hypertension   . Hypercholesteremia   . CHF (congestive heart failure)   . OSA on CPAP   . Peripheral edema    chronic  . Clotting disorder     deep vein thrombosis left leg  . Melanoma     left ear  . Shortness of breath   . Cellulitis and abscess of lower extremity 03/24/2014    RT LEG  . Diabetes mellitus     TYPE 2  . GERD (gastroesophageal reflux disease)   . Arthritis     RA     Past Surgical History  Procedure Laterality Date  . Hernia repair    . Tonsillectomy    . Skin cancer excision        Social History:  reports that he has never smoked. He has never used smokeless tobacco. He reports that he does not drink alcohol or use illicit drugs.    Allergies  Allergen Reactions  . Penicillins     Childhood allergy    Family History  Problem Relation Age of Onset  . Stroke Mother   . Hypertension Mother   . Asthma Mother   . Hypertension Father   . Asthma Sister   . Asthma Sister   . Asthma Daughter   . Tuberculosis Mother      Prior to Admission medications   Medication Sig Start Date End Date Taking? Authorizing Provider  acetaminophen (TYLENOL) 500 MG tablet Take 1,000 mg by mouth every 6 (six) hours as needed for mild pain.    Historical Provider, MD  diazepam (VALIUM) 5 MG tablet Take 5 mg by mouth every 8 (eight) hours as needed. For muscle spasm    Historical Provider, MD  doxycycline (VIBRAMYCIN) 50 MG capsule Take 2 capsules (100 mg total) by mouth 2 (two) times daily. 03/26/14   Melton Alar,  PA-C  furosemide (LASIX) 20 MG tablet Take 20 mg by mouth 2 (two) times daily.    Historical Provider, MD  glimepiride (AMARYL) 4 MG tablet Take 4 mg by mouth daily with breakfast.    Historical Provider, MD  HYDROcodone-acetaminophen (NORCO/VICODIN) 5-325 MG per tablet Take 1 tablet by mouth every 8 (eight) hours as needed for pain.    Historical Provider, MD  ibuprofen (ADVIL,MOTRIN) 200 MG tablet Take 400 mg by mouth every 6 (six) hours as needed for moderate pain.    Historical Provider, MD  indomethacin (INDOCIN) 25 MG capsule Take 25 mg by mouth daily as needed  (gout). For 3 days    Historical Provider, MD  losartan (COZAAR) 100 MG tablet Take 100 mg by mouth daily.    Historical Provider, MD  lovastatin (MEVACOR) 40 MG tablet Take 40 mg by mouth at bedtime.    Historical Provider, MD  metFORMIN (GLUCOPHAGE) 500 MG tablet Take 1,000 mg by mouth 2 (two) times daily with a meal.     Historical Provider, MD  naproxen (NAPROSYN) 500 MG tablet Take 500 mg by mouth 2 (two) times daily with a meal.    Historical Provider, MD  valACYclovir (VALTREX) 500 MG tablet Take 1 tablet (500 mg total) by mouth 2 (two) times daily as needed (for fever blisters). For 5 days 03/26/14   Melton Alar, PA-C  vardenafil (LEVITRA) 20 MG tablet Take 20 mg by mouth daily as needed for erectile dysfunction.    Historical Provider, MD     Physical Exam: Filed Vitals:   03/29/14 1115 03/29/14 1128 03/29/14 1145 03/29/14 1205  BP: 122/74  130/72 119/68  Pulse: 103  105 104  Temp:  99.6 F (37.6 C)    TempSrc:  Oral    Resp: 26  20 16   Height:      Weight:      SpO2: 98%  100% 98%     Constitutional: Vital signs reviewed. Patient is a well-developed and well-nourished in no acute distress and cooperative with exam. Alert and oriented x3.  Head: Normocephalic and atraumatic  Ear: TM normal bilaterally  Mouth: no erythema or exudates, MMM  Eyes: PERRL, EOMI, conjunctivae normal, No scleral icterus.  Neck: Supple, Trachea midline normal ROM, No JVD, mass, thyromegaly, or carotid bruit present.  Cardiovascular: RRR, S1 normal, S2 normal, no MRG, pulses symmetric and intact bilaterally  Pulmonary/Chest: CTAB, no wheezes, rales, or rhonchi  Abdominal: Soft. Non-tender, non-distended, bowel sounds are normal, no masses, organomegaly, or guarding present.  GU: no CVA tenderness Musculoskeletal: No joint deformities, erythema, or stiffness, ROM full and no nontender, Erythema, swelling, tenderness to right lower leg. Tender to palpation  Ext: no edema and no cyanosis, pulses  palpable bilaterally (DP and PT)  Hematology: no cervical, inginal, or axillary adenopathy.  Neurological: A&O x3, Strenght is normal and symmetric bilaterally, cranial nerve II-XII are grossly intact, no focal motor deficit, sensory intact to light touch bilaterally.  Skin: Warm, dry and intact. No rash, cyanosis, or clubbing.  Psychiatric: Normal mood and affect. speech and behavior is normal. Judgment and thought content normal. Cognition and memory are normal.       Labs on Admission:    Basic Metabolic Panel:  Recent Labs Lab 03/24/14 0023 03/25/14 0618 03/29/14 1030  NA 131* 134* 131*  K 3.8 4.0 4.5  CL 95* 99 92*  CO2 21 24 27   GLUCOSE 150* 163* 224*  BUN 18 17 14   CREATININE 1.20 1.22  1.11  CALCIUM 8.4 8.2* 8.9   Liver Function Tests:  Recent Labs Lab 03/24/14 0023 03/29/14 1030  AST 18 19  ALT 22 36  ALKPHOS 68 100  BILITOT 0.5 0.9  PROT 6.2 6.9  ALBUMIN 2.7* 3.0*   No results found for this basename: LIPASE, AMYLASE,  in the last 168 hours No results found for this basename: AMMONIA,  in the last 168 hours CBC:  Recent Labs Lab 03/24/14 0023 03/25/14 0618 03/29/14 1030  WBC 9.9 6.5 12.5*  NEUTROABS 8.6*  --  10.7*  HGB 13.0 12.4* 13.2  HCT 37.7* 36.8* 38.9*  MCV 84.7 87.6 85.7  PLT 154 151 266   Cardiac Enzymes:  Recent Labs Lab 03/24/14 0852  TROPONINI <0.30    BNP (last 3 results)  Recent Labs  05/07/13 1012 10/16/13 1407 03/24/14 0852  PROBNP 633.5* 228.6* 692.8*      CBG:  Recent Labs Lab 03/25/14 0749 03/25/14 1205 03/25/14 1645 03/25/14 2218 03/26/14 0749  GLUCAP 197* 158* 159* 165* 154*    Radiological Exams on Admission: No results found.  EKG: Independently reviewed.  Assessment/Plan Active Problems:   Cellulitis   Cellulitis  Anaphylactic reaction to zosyn Therefore no pcn Continue vanc and start cefepime BC ordered and pending  Fever blisters  -Treated with Valtrex in the hospital. Sent  home with a prescription of Valtrex.   Diabetes Mellitus - Type II  -Hgb A1c on 03/24/14 was 6.8. Improved from 11.2 on 11/21/13.  SSi with Novolog started  -Pt is taking oral meds at home without using insulin.   Chronic Systolic Congestive Heart Failure  -BNP is elevated,b/l lower extremity edema , Patient is taking Lasix 20 mg BID.  Needs diuresis when stable BP tomorrow   Hypertension  -BP low due to reaction to zosyn  -hold Lasix and Cozaar    Obstructive Sleep Apnea  -On CPAP at home. Continued using CPAP qhs in hospital      Code Status:   full Family Communication: bedside Disposition Plan: admit   Time spent: 70 mins   [place] [facility] Pager [phone]  If 7PM-7AM, please contact night-coverage [email or web address] Password [facility] 03/29/2014, 12:07 PM

## 2014-03-29 NOTE — ED Provider Notes (Signed)
CSN: [id number]     Arrival date & time 03/29/14  6301 History   First MD Initiated Contact with Patient 03/29/14 1002     Chief Complaint  Patient presents with  . Fever  . Recurrent Skin Infections     (Consider location/radiation/quality/duration/timing/severity/associated sxs/prior Treatment) HPI Michael Martinez is a 58 y.o. male with history of gout, DVT, CHF, recurrent cellulitis of right leg, who presents to ED. With complaint fever, generalized malaise. Pt recently admitted to the hospital, discharged 3 days ago. Was treated for cellulitis of right lower leg. Patient was treated on vancomycin while in the hospital, discharged with doxycycline. Patient states when he left the hospital he was feeling much better. States he has been taking antibiotics as prescribed. Yesterday he developed fever again of 102, chills, rigors. Today he wasn't feeling better is taking to emergency department. He reports associated headache, nausea but states it's because of the antibiotics, poor appetite, generalized weakness. States the redness and swelling of the leg is not worsening. He also reports difficulty urinating and feels like he is not emptying his bladder completely. Denies abdominal or back pain.  Past Medical History  Diagnosis Date  . DVT (deep venous thrombosis) 2006    "after ankle surgery"  . Gout   . Obesity   . Hypertension   . Hypercholesteremia   . CHF (congestive heart failure)   . OSA on CPAP   . Peripheral edema     chronic  . Clotting disorder     deep vein thrombosis left leg  . Melanoma     left ear  . Shortness of breath   . Cellulitis and abscess of lower extremity 03/24/2014    RT LEG  . Diabetes mellitus     TYPE 2  . GERD (gastroesophageal reflux disease)   . Arthritis     RA   Past Surgical History  Procedure Laterality Date  . Hernia repair    . Tonsillectomy    . Skin cancer excision     Family History  Problem Relation Age of Onset  . Stroke Mother    . Hypertension Mother   . Asthma Mother   . Hypertension Father   . Asthma Sister   . Asthma Sister   . Asthma Daughter   . Tuberculosis Mother    History  Substance Use Topics  . Smoking status: Never Smoker   . Smokeless tobacco: Never Used  . Alcohol Use: No    Review of Systems  Constitutional: Positive for fatigue. Negative for fever and chills.  Respiratory: Negative for cough, chest tightness and shortness of breath.   Cardiovascular: Negative for chest pain, palpitations and leg swelling.  Gastrointestinal: Negative for nausea, vomiting, abdominal pain, diarrhea and abdominal distention.  Genitourinary: Positive for difficulty urinating. Negative for dysuria, urgency, frequency, hematuria and flank pain.  Musculoskeletal: Positive for myalgias. Negative for neck pain and neck stiffness.  Skin: Negative for rash.  Allergic/Immunologic: Negative for immunocompromised state.  Neurological: Positive for weakness. Negative for dizziness, light-headedness, numbness and headaches.  All other systems reviewed and are negative.     Allergies  Penicillins  Home Medications   Prior to Admission medications   Medication Sig Start Date End Date Taking? Authorizing Provider  acetaminophen (TYLENOL) 500 MG tablet Take 1,000 mg by mouth every 6 (six) hours as needed for mild pain.    Historical Provider, MD  diazepam (VALIUM) 5 MG tablet Take 5 mg by mouth every 8 (eight) hours as  needed. For muscle spasm    Historical Provider, MD  doxycycline (VIBRAMYCIN) 50 MG capsule Take 2 capsules (100 mg total) by mouth 2 (two) times daily. 03/26/14   Bobby Rumpf [place], PA-C  furosemide (LASIX) 20 MG tablet Take 20 mg by mouth 2 (two) times daily.    Historical Provider, MD  glimepiride (AMARYL) 4 MG tablet Take 4 mg by mouth daily with breakfast.    Historical Provider, MD  HYDROcodone-acetaminophen (NORCO/VICODIN) 5-325 MG per tablet Take 1 tablet by mouth every 8 (eight) hours as needed for  pain.    Historical Provider, MD  ibuprofen (ADVIL,MOTRIN) 200 MG tablet Take 400 mg by mouth every 6 (six) hours as needed for moderate pain.    Historical Provider, MD  indomethacin (INDOCIN) 25 MG capsule Take 25 mg by mouth daily as needed (gout). For 3 days    Historical Provider, MD  losartan (COZAAR) 100 MG tablet Take 100 mg by mouth daily.    Historical Provider, MD  lovastatin (MEVACOR) 40 MG tablet Take 40 mg by mouth at bedtime.    Historical Provider, MD  metFORMIN (GLUCOPHAGE) 500 MG tablet Take 1,000 mg by mouth 2 (two) times daily with a meal.     Historical Provider, MD  naproxen (NAPROSYN) 500 MG tablet Take 500 mg by mouth 2 (two) times daily with a meal.    Historical Provider, MD  valACYclovir (VALTREX) 500 MG tablet Take 1 tablet (500 mg total) by mouth 2 (two) times daily as needed (for fever blisters). For 5 days 03/26/14   Melton Alar, PA-C  vardenafil (LEVITRA) 20 MG tablet Take 20 mg by mouth daily as needed for erectile dysfunction.    Historical Provider, MD   BP 130/85  Pulse 106  Temp(Src) 99.6 F (37.6 C) (Oral)  Resp 19  Ht 6' (1.829 m)  Wt 396 lb (179.624 kg)  BMI 53.70 kg/m2  SpO2 100% Physical Exam  Nursing note and vitals reviewed. Constitutional: He appears well-developed and well-nourished. No distress.  HENT:  Head: Normocephalic and atraumatic.  Eyes: Conjunctivae are normal. Pupils are equal, round, and reactive to light.  Neck: Neck supple.  Cardiovascular: Normal rate, regular rhythm and normal heart sounds.   Pulmonary/Chest: Effort normal. No respiratory distress. He has no wheezes. He has no rales.  Abdominal: Soft. Bowel sounds are normal. He exhibits no distension. There is no tenderness. There is no rebound.  Musculoskeletal: He exhibits no edema.  Erythema, swelling, tenderness to right lower leg. Tender to palpation.  Neurological: He is alert.  Skin: Skin is warm and dry.    ED Course  Procedures (including critical care  time) Labs Review Labs Reviewed  CBC WITH DIFFERENTIAL - Abnormal; Notable for the following:    WBC 12.5 (*)    HCT 38.9 (*)    Neutrophils Relative % 85 (*)    Neutro Abs 10.7 (*)    Lymphocytes Relative 8 (*)    All other components within normal limits  URINALYSIS, ROUTINE W REFLEX MICROSCOPIC - Abnormal; Notable for the following:    Glucose, UA 250 (*)    Leukocytes, UA TRACE (*)    All other components within normal limits  COMPREHENSIVE METABOLIC PANEL - Abnormal; Notable for the following:    Sodium 131 (*)    Chloride 92 (*)    Glucose, Bld 224 (*)    Albumin 3.0 (*)    GFR calc non Af Amer 70 (*)    GFR calc Af Wyvonnia Lora  82 (*)    All other components within normal limits  URINE CULTURE  URINE MICROSCOPIC-ADD ON  I-STAT CG4 LACTIC ACID, ED    Imaging Review No results found.   EKG Interpretation None      MDM   Final diagnoses:  Left leg cellulitis  Penicillin allergy  Anaphylaxis, initial encounter   With recurrent right lower leg cellulitis. D/c from inpatient 2 days ago. Currently on doxycycline. Worsened last night. Fever up to 102 at home. Will get labs, UA.   11:51 AM Labs showing elevated WBC of 12.5. Will consult hospitalist for further evaluation and possible admission. Will start on clindamycin.   Spoke with [facility], will admit pt. PT does not meet SIRS or sepsis criteria at this time. VS stable.   Filed Vitals:   03/29/14 1345 03/29/14 1415 03/29/14 1510 03/29/14 1543  BP: 98/55 98/56 90/78  113/74  Pulse: 103 98    Temp:    99.7 F (37.6 C)  TempSrc:    Oral  Resp: 21 34  18  Height:    6' (1.829 m)  Weight:    391 lb 12.1 oz (177.7 kg)  SpO2: 96% 97% 100%        Renold Genta, PA-C 03/29/14 1604

## 2014-03-29 NOTE — ED Notes (Addendum)
Admitting MD advised to give zosyn despite pt's allergy to penicillins. Pt has not had penicillin since he was a child- unsure of reaction. Pharmacy and Dr. Allyson Sabal aware and ordered to give zosyn. Will carry out order

## 2014-03-29 NOTE — ED Notes (Signed)
Secretary paged [facility] to inform them that pt will be going to 5W06. [facility] will check pt's CPAP

## 2014-03-29 NOTE — Progress Notes (Signed)
Patient has home CPAP in room and ready for use.  The machine was inspected and approved for use today by [facility].  Patient states he does not require assistance with placement, as he is familiar with the equipment and procedure.

## 2014-03-29 NOTE — ED Provider Notes (Signed)
  Face-to-face evaluation   History: He is here for evaluation of fever, chills, and right leg pain. He feels like his cellulitis is getting worse.   Physical exam: Obese, alert, cooperative. Right leg is moderately swollen with redness of the right lower leg. There is no tenderness of the popliteal region. There is mild right calf tenderness to palpation.  Medical screening examination/treatment/procedure(s) were conducted as a shared visit with non-physician practitioner(s) and myself.  I personally evaluated the patient during the encounter  13:40- I was called to the room to see the patient, who is having an allergic reaction, after initiation of Zofran, for infection. Patient was alert, sitting at bedside complaining of severe shortness of breath. He had generalized hives. Mouth is without angioedema. The airway is intact. There is no stridor. Lungs have good air movement anteriorly and posteriorly. There is no wheezes or rales. The patient is diaphoretic. The patient is mentating well. Patient was laid down. Blood pressure 99/66.  Immediate treatment ordered for anaphylaxis- Benadryl, Pepcid, Solu-Medrol, and IV fluid bolus  13:44- contacted admitting hospitalist, who will see the patient  13:58- Rash is improving, he is more comfortable. Blood pressure 98/55  14:58- He is comfortable. No itching. Able to stand and void.    EKG Interpretation  Date/Time:  Sunday March 29 2014 13:48:08 EDT Ventricular Rate:  101 PR Interval:  189 QRS Duration: 102 QT Interval:  349 QTC Calculation: 452 R Axis:   175 Text Interpretation:  Sinus tachycardia Right axis deviation Low voltage, precordial leads Consider anterior infarct Since last tracing rate faster Confirmed by Jersey Ravenscroft  MD, Shravya Wickwire ([id number]) on 03/29/2014 1:56:37 PM         CRITICAL CARE Performed by: Richarda Blade Total critical care time:  30 minutes Critical care time was exclusive of separately billable procedures and  treating other patients. Critical care was necessary to treat or prevent imminent or life-threatening deterioration. Critical care was time spent personally by me on the following activities: development of treatment plan with patient and/or surrogate as well as nursing, discussions with consultants, evaluation of patient's response to treatment, examination of patient, obtaining history from patient or surrogate, ordering and performing treatments and interventions, ordering and review of laboratory studies, ordering and review of radiographic studies, pulse oximetry and re-evaluation of patient's condition.  Richarda Blade, MD 03/29/14 [phone]

## 2014-03-29 NOTE — Progress Notes (Addendum)
ANTIBIOTIC CONSULT NOTE - INITIAL  Pharmacy Consult for Vancomycin, Zosyn Indication: Cellulitis   Allergies  Allergen Reactions  . Penicillins     Childhood allergy    Patient Measurements: Height: 6' (182.9 cm) Weight: 396 lb (179.624 kg) IBW/kg (Calculated) : 77.6 Adjusted Body Weight: n/a   Vital Signs: Temp: 99.6 F (37.6 C) (09/13 1128) Temp src: Oral (09/13 1128) BP: 119/68 mmHg (09/13 1205) Pulse Rate: 104 (09/13 1205) Intake/Output from previous day:   Intake/Output from this shift:    Labs:  Recent Labs  03/29/14 1030  WBC 12.5*  HGB 13.2  PLT 266  CREATININE 1.11   Estimated Creatinine Clearance: 118.5 ml/min (by C-G formula based on Cr of 1.11). No results found for this basename: VANCOTROUGH, VANCOPEAK, VANCORANDOM, [place], [place], GENTRANDOM, [place], TOBRAPEAK, TOBRARND, AMIKACINPEAK, AMIKACINTROU, AMIKACIN,  in the last 72 hours   Microbiology: Recent Results (from the past 720 hour(s))  CULTURE, BLOOD (ROUTINE X 2)     Status: None   Collection Time    03/24/14  5:55 AM      Result Value Ref Range Status   Specimen Description BLOOD FOREARM RIGHT   Final   Special Requests BOTTLES DRAWN AEROBIC ONLY 3ML   Final   Culture  Setup Time     Final   Value: 03/24/2014 14:03     Performed at [facility]   Culture     Final   Value:        BLOOD CULTURE RECEIVED NO GROWTH TO DATE CULTURE WILL BE HELD FOR 5 DAYS BEFORE ISSUING A FINAL NEGATIVE REPORT     Performed at [facility]   Report Status PENDING   Incomplete  CULTURE, BLOOD (ROUTINE X 2)     Status: None   Collection Time    03/24/14  5:55 AM      Result Value Ref Range Status   Specimen Description BLOOD HAND RIGHT   Final   Special Requests BOTTLES DRAWN AEROBIC ONLY 1.5ML   Final   Culture  Setup Time     Final   Value: 03/24/2014 14:03     Performed at [facility]   Culture     Final   Value:        BLOOD CULTURE RECEIVED NO GROWTH TO DATE  CULTURE WILL BE HELD FOR 5 DAYS BEFORE ISSUING A FINAL NEGATIVE REPORT     Note: Culture results may be compromised due to an inadequate volume of blood received in culture bottles.     Performed at [facility]   Report Status PENDING   Incomplete    Medical History: Past Medical History  Diagnosis Date  . DVT (deep venous thrombosis) 2006    "after ankle surgery"  . Gout   . Obesity   . Hypertension   . Hypercholesteremia   . CHF (congestive heart failure)   . OSA on CPAP   . Peripheral edema     chronic  . Clotting disorder     deep vein thrombosis left leg  . Melanoma     left ear  . Shortness of breath   . Cellulitis and abscess of lower extremity 03/24/2014    RT LEG  . Diabetes mellitus     TYPE 2  . GERD (gastroesophageal reflux disease)   . Arthritis     RA    Medications:   (Not in a hospital admission) Assessment: 77 YOM presented to the ED with recurrent cellulitis of  right leg. He was recently admitted to the hospital for the same complaint and given one dose of IV vanc and discharged home on doxycycline. Pharmacy consulted to start Vancomycin and Zosyn as empiric therapy. WBC is elevated at 12.5 and Tmax 101.72F. CrCl > 100 mL/min. Pt states he has a childhood allergy to PCN but does not remember what the reaction was.    9/13: Blood Cx x2>> 914 Urine Cx >>   Goal of Therapy:  Vancomycin trough level 10-15 mcg/ml  Plan:  -Give Vancomycin IV 2500 mg x 1 dose followed by Vanc 1500 mg IV Q 12 hours -Give Zosyn 3.375 gm IV Q 8 hours. Spoke with Dr. Nada Maclachlan about patient's PCN allergy. She suggested administering Zosyn with prn IV benadryl so as not to rule out penicillins without ensuring the patient is truly allergic -Monitor CBC, renal fx, cultures and patient's clinical progres -VT at [facility]  -Monitor for allergic rxn. RN informed.   Albertina Parr, PharmD.  Clinical Pharmacist Pager [phone]   Addendum: Patient had an allergic reaction to  Zosyn with chest tightness, diaphoreses and difficultly breathing. He was given IV benadryl. Now stable. Changing Zosyn to Primaxin for cellulitis. CrCl > 100 ml/min.   Plan: -Give Primaxin 500 mg Q 6 hours -Monitor for s/s of allergic reaction.   Albertina Parr, PharmD.  Clinical Pharmacist Pager [phone]

## 2014-03-29 NOTE — ED Notes (Signed)
Pt resting in bed. Airway intact. Obtaining EKG. BP 99/60. Verbal order to give 582mL bolus.

## 2014-03-29 NOTE — ED Notes (Signed)
Pt. Was in hospital for cellulitis in leg and discharged and back cause of fever , taking oral antibiotics.

## 2014-03-29 NOTE — Progress Notes (Signed)
[facility] is called about pt's home CPAP.

## 2014-03-29 NOTE — ED Notes (Signed)
Pt requesting to use his home CPAP for when he doses off to sleep. Dr. Eulis Foster ok with this. Pt's dtr went to get it. Will have biomed check device.

## 2014-03-29 NOTE — Progress Notes (Signed)
CPAP checked by [facility]; OK to use

## 2014-03-29 NOTE — ED Notes (Signed)
RN on [place] aware that Vanc still needs to be given. Per verbal order from Dr. Allyson Sabal, pt can receive vanc when more stable. Will send vanc up with pt for RN to administer.

## 2014-03-29 NOTE — ED Notes (Signed)
Pt states he is feeling much better. Wheels and Redness has subsided. Pt airway intact.

## 2014-03-29 NOTE — ED Notes (Addendum)
Stopped zosyn; pt received about 1/2 dose. Pt complaining of chest tightness, pt very diaphoretic, pt chest, arms red, difficulty breathing. Airway intact. Dr. Eulis Foster at bedside. Gave benedryl.

## 2014-03-30 DIAGNOSIS — L02419 Cutaneous abscess of limb, unspecified: Principal | ICD-10-CM

## 2014-03-30 DIAGNOSIS — L03119 Cellulitis of unspecified part of limb: Principal | ICD-10-CM

## 2014-03-30 LAB — COMPREHENSIVE METABOLIC PANEL
ALBUMIN: 2.5 g/dL — AB (ref 3.5–5.2)
ALK PHOS: 103 U/L (ref 39–117)
ALT: 24 U/L (ref 0–53)
ANION GAP: 11 (ref 5–15)
AST: 10 U/L (ref 0–37)
BILIRUBIN TOTAL: 0.4 mg/dL (ref 0.3–1.2)
BUN: 20 mg/dL (ref 6–23)
CHLORIDE: 98 meq/L (ref 96–112)
CO2: 25 mEq/L (ref 19–32)
Calcium: 8.4 mg/dL (ref 8.4–10.5)
Creatinine, Ser: 1.07 mg/dL (ref 0.50–1.35)
GFR calc Af Amer: 85 mL/min — ABNORMAL LOW (ref >90)
GFR calc non Af Amer: 74 mL/min — ABNORMAL LOW (ref >90)
Glucose, Bld: 289 mg/dL — ABNORMAL HIGH (ref 70–99)
POTASSIUM: 4.4 meq/L (ref 3.7–5.3)
Sodium: 134 mEq/L — ABNORMAL LOW (ref 137–147)
TOTAL PROTEIN: 6 g/dL (ref 6.0–8.3)

## 2014-03-30 LAB — CBC
HEMATOCRIT: 35.3 % — AB (ref 39.0–52.0)
HEMOGLOBIN: 12.1 g/dL — AB (ref 13.0–17.0)
MCH: 29.2 pg (ref 26.0–34.0)
MCHC: 34.3 g/dL (ref 30.0–36.0)
MCV: 85.3 fL (ref 78.0–100.0)
Platelets: 272 10*3/uL (ref 150–400)
RBC: 4.14 MIL/uL — ABNORMAL LOW (ref 4.22–5.81)
RDW: 13.5 % (ref 11.5–15.5)
WBC: 11.9 10*3/uL — ABNORMAL HIGH (ref 4.0–10.5)

## 2014-03-30 LAB — GLUCOSE, CAPILLARY
GLUCOSE-CAPILLARY: 239 mg/dL — AB (ref 70–99)
GLUCOSE-CAPILLARY: 132 mg/dL — AB (ref 70–99)
Glucose-Capillary: 337 mg/dL — ABNORMAL HIGH (ref 70–99)
Glucose-Capillary: 247 mg/dL — ABNORMAL HIGH (ref 70–99)
Glucose-Capillary: 122 mg/dL — ABNORMAL HIGH (ref 70–99)

## 2014-03-30 LAB — CULTURE, BLOOD (ROUTINE X 2)
CULTURE: NO GROWTH
Culture: NO GROWTH

## 2014-03-30 MED ORDER — INSULIN GLARGINE 100 UNIT/ML ~~LOC~~ SOLN
10.0000 [IU] | Freq: Two times a day (BID) | SUBCUTANEOUS | Status: DC
  Administered 2014-03-30 – 2014-04-01 (×5): 10 [IU] via SUBCUTANEOUS
  Filled 2014-03-30 (×6): qty 0.1

## 2014-03-30 MED ORDER — INSULIN ASPART 100 UNIT/ML ~~LOC~~ SOLN
0.0000 [IU] | Freq: Every day | SUBCUTANEOUS | Status: DC
  Administered 2014-03-30: 4 [IU] via SUBCUTANEOUS

## 2014-03-30 MED ORDER — INSULIN ASPART 100 UNIT/ML ~~LOC~~ SOLN
0.0000 [IU] | Freq: Three times a day (TID) | SUBCUTANEOUS | Status: DC
  Administered 2014-03-30 (×2): 2 [IU] via SUBCUTANEOUS
  Administered 2014-03-30: 5 [IU] via SUBCUTANEOUS
  Administered 2014-03-31 (×2): 2 [IU] via SUBCUTANEOUS
  Administered 2014-03-31: 1 [IU] via SUBCUTANEOUS
  Administered 2014-04-01: 2 [IU] via SUBCUTANEOUS

## 2014-03-30 MED ORDER — ENOXAPARIN SODIUM 100 MG/ML ~~LOC~~ SOLN
0.5000 mg/kg | Freq: Every day | SUBCUTANEOUS | Status: DC
  Administered 2014-03-31: 90 mg via SUBCUTANEOUS
  Filled 2014-03-30 (×2): qty 1

## 2014-03-30 NOTE — Progress Notes (Signed)
Pt's had a late dinner and cbg was over 300 at hs with no hs insulin coverage, cbg was rechecked after midnight and it was still over 300,  [place], [place]  On call was notified, orders given for hs coverage to be given at 0130, insulin given.. Will continue to monitor pt.----Tyjai Charbonnet, rn

## 2014-03-30 NOTE — Progress Notes (Signed)
Inpatient Diabetes Program Recommendations  AACE/ADA: New Consensus Statement on Inpatient Glycemic Control (2013)  Target Ranges:  Prepandial:   less than 140 mg/dL      Peak postprandial:   less than 180 mg/dL (1-2 hours)      Critically ill patients:  140 - 180 mg/dL   Results for MOE, GRACA (MRN [id number]) as of 03/30/2014 11:19  Ref. Range 03/29/2014 16:39 03/29/2014 22:04 03/30/2014 00:24 03/30/2014 08:04  Glucose-Capillary Latest Range: 70-99 mg/dL 246 (H) 335 (H) 337 (H) 239 (H)    Diabetes history: DM2 Outpatient Diabetes medications: Amaryl 4 mg QAM, Metformin 1000 mg BID Current orders for Inpatient glycemic control: Amaryl 4 mg QAM, Novolog 0-15 units AC, Novolog 0-5 units HS  Inpatient Diabetes Program Recommendations Insulin - Basal: Please consider ordering low dose basal insulin; recommend starting with Lantus 10 units Q24H starting now. Oral Agents: Noted Amaryl 4 mg QAM ordered (received this morning).  Noted patient received one dose of Solumedrol 125 mg on 9/13 at 13:49 which is contributing to hyperglycemia. Please consider ordering low dose basal insulin.  Thanks, Barnie Alderman, RN, MSN, CCRN Diabetes Coordinator Inpatient Diabetes Program [phone] (Team Pager) [phone] ([facility] office) [phone] [facility] office)

## 2014-03-30 NOTE — Progress Notes (Signed)
Pt has home cpap and does not require any assistance . Pt knows to call RT if any questions.

## 2014-03-30 NOTE — Progress Notes (Signed)
TEAM 1 - Stepdown/ICU TEAM Progress Note  Michael Martinez:[id number] DOB: 1955/12/01 DOA: 03/29/2014 PCP: Ashok Norris, MD  Admit HPI / Brief Narrative: 58 y.o. male with history of gout, DVT, CHF, recurrent cellulitis of right leg, who presented to ED with complaint of fever and generalized malaise. Pt recently admitted to the hospital, discharged 3 days prior to this readmit. Was treated for cellulitis of right lower leg. Doppler was negative for DVT on 9/9. Patient was treated w/ vancomycin while in the hospital, and discharged with doxycycline. Patient stated when he left the hospital he was feeling much better. Stated he had been taking antibiotics as prescribed. The day prior to his readmit he developed fever again of 102, w/ chills, rigors. The patient has had cellulitis in this leg four times in the past year.  HPI/Subjective: Pt feels his leg is beginning to improve.  Relates that problems recurred after change to doxycyline.  Denies n/c, sob, or cp.    Assessment/Plan:  R LE recurrent cellulitis Pt is PCN allergic, w/ report of anaphylaxis - WBC improving - continue current tx regimen and follow clinical exam - venous dopplers negative for DVT 03/25/2014  UA + Should be covered w/ current abx regimen - follow culture   Recent Fever blister Appears to have resolved  DM2 A1c 6.9 - CBG poorly controlled - adjust tx plan and follow trend   Chronic Systolic and diastolic CHF  EF 50-09% via TTE 02/14/2014 - appears modestly volume overloaded - gently diurese and follow   HTN BP currently well controlled  OSA On CPAP at home  HLD Cont home med tx  Obesity - Body mass index is 53.66 kg/(m^2).   Code Status: FULL Family Communication: no family present at time of exam Disposition Plan: stable for transfer to medical bed   Consultants: none  Procedures: none  Antibiotics: Clinda 9/13 Zosyn 9/13 Imipenem 9/13 > Vanc 9/13 >  DVT  prophylaxis: lovenox  Objective: Blood pressure 121/68, pulse 83, temperature 97.7 F (36.5 C), temperature source Oral, resp. rate 30, height 6' (1.829 m), weight 179.5 kg (395 lb 11.6 oz), SpO2 97.00%.  Intake/Output Summary (Last 24 hours) at 03/30/14 1249 Last data filed at 03/30/14 0955  Gross per 24 hour  Intake 2464.67 ml  Output   2175 ml  Net 289.67 ml   Exam: General: No acute respiratory distress Lungs: Clear to auscultation bilaterally without wheezes or crackles Cardiovascular: Regular rate and rhythm without murmur gallop or rub normal S1 and S2 Abdomen: Obese, nontender, soft, bowel sounds positive, no rebound, no ascites, no appreciable mass Extremities: R LE w/ circumferential erythema and color - no appreciable wound - B LE w/ 2+ taught edema   Data Reviewed: Basic Metabolic Panel:  Recent Labs Lab 03/24/14 0023 03/25/14 0618 03/29/14 1030 03/29/14 1556 03/30/14 0500  NA 131* 134* 131*  --  134*  K 3.8 4.0 4.5  --  4.4  CL 95* 99 92*  --  98  CO2 21 24 27   --  25  GLUCOSE 150* 163* 224*  --  289*  BUN 18 17 14   --  20  CREATININE 1.20 1.22 1.11 1.10 1.07  CALCIUM 8.4 8.2* 8.9  --  8.4  MG  --   --   --  1.7  --    Liver Function Tests:  Recent Labs Lab 03/24/14 0023 03/29/14 1030 03/29/14 1556 03/30/14 0500  AST 18 19 18 10   ALT 22 36 32  24  ALKPHOS 68 100 101 103  BILITOT 0.5 0.9 1.0 0.4  PROT 6.2 6.9 6.7 6.0  ALBUMIN 2.7* 3.0* 2.8* 2.5*   CBC:  Recent Labs Lab 03/24/14 0023 03/25/14 0618 03/29/14 1030 03/29/14 1556 03/30/14 0500  WBC 9.9 6.5 12.5* 14.7* 11.9*  NEUTROABS 8.6*  --  10.7*  --   --   HGB 13.0 12.4* 13.2 13.4 12.1*  HCT 37.7* 36.8* 38.9* 38.9* 35.3*  MCV 84.7 87.6 85.7 84.7 85.3  PLT 154 151 266 265 272    Cardiac Enzymes:  Recent Labs Lab 03/24/14 0852  TROPONINI <0.30   BNP (last 3 results)  Recent Labs  05/07/13 1012 10/16/13 1407 03/24/14 0852  PROBNP 633.5* 228.6* 692.8*    CBG:  Recent  Labs Lab 03/29/14 1639 03/29/14 2204 03/30/14 0024 03/30/14 0804 03/30/14 1148  GLUCAP 246* 335* 337* 239* 247*    Recent Results (from the past 240 hour(s))  CULTURE, BLOOD (ROUTINE X 2)     Status: None   Collection Time    03/24/14  5:55 AM      Result Value Ref Range Status   Specimen Description BLOOD FOREARM RIGHT   Final   Special Requests BOTTLES DRAWN AEROBIC ONLY 3ML   Final   Culture  Setup Time     Final   Value: 03/24/2014 14:03     Performed at [facility]   Culture     Final   Value: NO GROWTH 5 DAYS     Performed at [facility]   Report Status 03/30/2014 FINAL   Final  CULTURE, BLOOD (ROUTINE X 2)     Status: None   Collection Time    03/24/14  5:55 AM      Result Value Ref Range Status   Specimen Description BLOOD HAND RIGHT   Final   Special Requests BOTTLES DRAWN AEROBIC ONLY 1.5ML   Final   Culture  Setup Time     Final   Value: 03/24/2014 14:03     Performed at [facility]   Culture     Final   Value: NO GROWTH 5 DAYS     Note: Culture results may be compromised due to an inadequate volume of blood received in culture bottles.     Performed at [facility]   Report Status 03/30/2014 FINAL   Final  MRSA PCR SCREENING     Status: None   Collection Time    03/29/14  3:50 PM      Result Value Ref Range Status   MRSA by PCR NEGATIVE  NEGATIVE Final   Comment:            The GeneXpert MRSA Assay (FDA     approved for NASAL specimens     only), is one component of a     comprehensive MRSA colonization     surveillance program. It is not     intended to diagnose MRSA     infection nor to guide or     monitor treatment for     MRSA infections.  CULTURE, BLOOD (ROUTINE X 2)     Status: None   Collection Time    03/29/14  3:56 PM      Result Value Ref Range Status   Specimen Description BLOOD LEFT HAND   Final   Special Requests BOTTLES DRAWN AEROBIC AND ANAEROBIC 10CC   Final   Culture  Setup Time     Final  Value: 03/29/2014 21:49     Performed at [facility]   Culture     Final   Value:        BLOOD CULTURE RECEIVED NO GROWTH TO DATE CULTURE WILL BE HELD FOR 5 DAYS BEFORE ISSUING A FINAL NEGATIVE REPORT     Performed at [facility]   Report Status PENDING   Incomplete  CULTURE, BLOOD (ROUTINE X 2)     Status: None   Collection Time    03/29/14  4:09 PM      Result Value Ref Range Status   Specimen Description BLOOD LEFT ARM   Final   Special Requests BOTTLES DRAWN AEROBIC AND ANAEROBIC 10CC   Final   Culture  Setup Time     Final   Value: 03/29/2014 21:48     Performed at [facility]   Culture     Final   Value:        BLOOD CULTURE RECEIVED NO GROWTH TO DATE CULTURE WILL BE HELD FOR 5 DAYS BEFORE ISSUING A FINAL NEGATIVE REPORT     Performed at [facility]   Report Status PENDING   Incomplete     Studies:  Recent x-ray studies have been reviewed in detail by the Attending Physician  Scheduled Meds:  Scheduled Meds: . enoxaparin (LOVENOX) injection  40 mg Subcutaneous Q24H  . glimepiride  4 mg Oral Q breakfast  . imipenem-cilastatin  500 mg Intravenous Q6H  . insulin aspart  0-15 Units Subcutaneous TID WC  . insulin aspart  0-5 Units Subcutaneous QHS  . pneumococcal 23 valent vaccine  0.5 mL Intramuscular Tomorrow-1000  . simvastatin  5 mg Oral q1800  . sodium chloride  3 mL Intravenous Q12H  . vancomycin  1,500 mg Intravenous Q12H    Time spent on care of this patient: 35 mins   MCCLUNG,JEFFREY T , MD   [facility] Office  [phone] Pager - Text Page per Shea Evans as per below:  On-Call/Text Page:      [email or web address]      password [email or web address]  If 7PM-7AM, please contact night-coverage [email or web address] Password [email or web address] 03/30/2014, 12:49 PM   LOS: 1 day

## 2014-03-31 LAB — CBC
HEMATOCRIT: 34.3 % — AB (ref 39.0–52.0)
HEMOGLOBIN: 11.5 g/dL — AB (ref 13.0–17.0)
MCH: 29 pg (ref 26.0–34.0)
MCHC: 33.5 g/dL (ref 30.0–36.0)
MCV: 86.6 fL (ref 78.0–100.0)
Platelets: 312 10*3/uL (ref 150–400)
RBC: 3.96 MIL/uL — ABNORMAL LOW (ref 4.22–5.81)
RDW: 13.8 % (ref 11.5–15.5)
WBC: 8.6 10*3/uL (ref 4.0–10.5)

## 2014-03-31 LAB — BASIC METABOLIC PANEL
Anion gap: 11 (ref 5–15)
BUN: 22 mg/dL (ref 6–23)
CO2: 26 mEq/L (ref 19–32)
CREATININE: 1.18 mg/dL (ref 0.50–1.35)
Calcium: 8.2 mg/dL — ABNORMAL LOW (ref 8.4–10.5)
Chloride: 102 mEq/L (ref 96–112)
GFR calc non Af Amer: 65 mL/min — ABNORMAL LOW (ref >90)
GFR, EST AFRICAN AMERICAN: 76 mL/min — AB (ref >90)
GLUCOSE: 143 mg/dL — AB (ref 70–99)
POTASSIUM: 4 meq/L (ref 3.7–5.3)
Sodium: 139 mEq/L (ref 137–147)

## 2014-03-31 LAB — GLUCOSE, CAPILLARY
GLUCOSE-CAPILLARY: 145 mg/dL — AB (ref 70–99)
Glucose-Capillary: 143 mg/dL — ABNORMAL HIGH (ref 70–99)
Glucose-Capillary: 124 mg/dL — ABNORMAL HIGH (ref 70–99)
Glucose-Capillary: 148 mg/dL — ABNORMAL HIGH (ref 70–99)

## 2014-03-31 LAB — URINE CULTURE
CULTURE: NO GROWTH
Colony Count: NO GROWTH

## 2014-03-31 LAB — VANCOMYCIN, TROUGH: VANCOMYCIN TR: 17.3 ug/mL (ref 10.0–20.0)

## 2014-03-31 MED ORDER — SODIUM CHLORIDE 0.9 % IV SOLN
1250.0000 mg | Freq: Two times a day (BID) | INTRAVENOUS | Status: DC
  Administered 2014-03-31 – 2014-04-01 (×2): 1250 mg via INTRAVENOUS
  Filled 2014-03-31 (×3): qty 1250

## 2014-03-31 MED ORDER — FUROSEMIDE 40 MG PO TABS
40.0000 mg | ORAL_TABLET | Freq: Every day | ORAL | Status: DC
  Administered 2014-03-31: 40 mg via ORAL
  Filled 2014-03-31 (×2): qty 1

## 2014-03-31 MED ORDER — FUROSEMIDE 40 MG PO TABS
40.0000 mg | ORAL_TABLET | Freq: Two times a day (BID) | ORAL | Status: DC
  Administered 2014-03-31 – 2014-04-01 (×2): 40 mg via ORAL
  Filled 2014-03-31 (×4): qty 1

## 2014-03-31 NOTE — Progress Notes (Addendum)
Progress Note  Michael Martinez OVZ:[id number] DOB: 11-19-1955 DOA: 03/29/2014 PCP: Ashok Norris, MD  Admit HPI / Brief Narrative:  58 y.o. male with history of gout, DVT, CHF, recurrent cellulitis of right leg, who presented to ED with complaint of fever and generalized malaise. Pt recently admitted to the hospital, discharged 3 days prior to this readmit. Was treated for cellulitis of right lower leg. Doppler was negative for DVT on 9/9. Patient was treated w/ vancomycin while in the hospital, and discharged with doxycycline. Patient stated when he left the hospital he was feeling much better. Stated he had been taking antibiotics as prescribed. The day prior to his readmit he developed fever again of 102, w/ chills, rigors. The patient has had cellulitis in this leg four times in the past year.   HPI/Subjective:  Pt sitting in a recliner, no fever chills, no headache, no chest abdominal pain, no shortness of breath. Right leg less swollen and red.     Assessment/Plan:  R LE recurrent cellulitis failed outpatient doxycycline Pt is PCN allergic, w/ report of anaphylaxis -  currently on IV vancomycin and imipenem with good results, continue for 1-2 more days. Blood cultures negative thus far.   Venous dopplers negative for DVT 03/25/2014 upon last admission.    UTI Should be covered w/ current abx regimen - follow cultures which are negative to date.     Recent Fever blister Appears to have resolved    DM2 A1c 6.9 - on Amaryl, Lantus and sliding-scale. CBGs stable. Glucophage on hold.  CBG (last 3)   Recent Labs  03/30/14 1600 03/30/14 2007 03/31/14 0745  GLUCAP 122* 132* 143*      Mild acute on chronic  Systolic and diastolic CHF  EF 41-28% via TTE 02/14/2014 - had mild volume overload initially and was diuresed in step-down, currently appears compensated. Place on fluid salt restriction lung with home dose Lasix. Patient requesting that he needs to be on 40mg  PO BID  lasix (he titrates his dose) .    HTN BP currently well controlled, not on any meds.    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 Follow with PCP post discharge.      Code Status: FULL Family Communication: no family present at time of exam Disposition Plan: stable for transfer to medical bed   Consultants: none  Procedures:   Venous US - done one week ago negative for DVT -   Right lower extremity venous duplex has been completed. Preliminary findings: no evidence of DVT  Landry Mellow, RDMS, RVT  03/25/2014, 9:45 AM    Antibiotics: Clinda 9/13 Zosyn 9/13 Imipenem 9/13 > Vanc 9/13 >   DVT prophylaxis: Lovenox    Objective: Blood pressure 129/72, pulse 86, temperature 98.3 F (36.8 C), temperature source Oral, resp. rate 22, height 6' (1.829 m), weight 179.5 kg (395 lb 11.6 oz), SpO2 97.00%.  Intake/Output Summary (Last 24 hours) at 03/31/14 0956 Last data filed at 03/31/14 7867  Gross per 24 hour  Intake   1200 ml  Output    400 ml  Net    800 ml   Exam: General: No acute respiratory distress Lungs: Clear to auscultation bilaterally without wheezes or crackles Cardiovascular: Regular rate and rhythm without murmur gallop or rub normal S1 and S2 Abdomen: Obese, nontender, soft, bowel sounds positive, no rebound, no ascites, no appreciable mass Extremities: R LE w/  circumferential erythema and & minimal warmth below knee- no appreciable wound - B LE trace edema   Data Reviewed: Basic Metabolic Panel:  Recent Labs Lab 03/25/14 0618 03/29/14 1030 03/29/14 1556 03/30/14 0500 03/31/14 0517  NA 134* 131*  --  134* 139  K 4.0 4.5  --  4.4 4.0  CL 99 92*  --  98 102  CO2 24 27  --  25 26  GLUCOSE 163* 224*  --  289* 143*  BUN 17 14  --  20 22  CREATININE 1.22 1.11 1.10 1.07 1.18  CALCIUM 8.2* 8.9  --  8.4 8.2*  MG  --   --  1.7  --   --    Liver Function Tests:  Recent Labs Lab  03/29/14 1030 03/29/14 1556 03/30/14 0500  AST 19 18 10   ALT 36 32 24  ALKPHOS 100 101 103  BILITOT 0.9 1.0 0.4  PROT 6.9 6.7 6.0  ALBUMIN 3.0* 2.8* 2.5*   CBC:  Recent Labs Lab 03/25/14 0618 03/29/14 1030 03/29/14 1556 03/30/14 0500 03/31/14 0517  WBC 6.5 12.5* 14.7* 11.9* 8.6  NEUTROABS  --  10.7*  --   --   --   HGB 12.4* 13.2 13.4 12.1* 11.5*  HCT 36.8* 38.9* 38.9* 35.3* 34.3*  MCV 87.6 85.7 84.7 85.3 86.6  PLT 151 266 265 272 312    Cardiac Enzymes: No results found for this basename: CKTOTAL, CKMB, CKMBINDEX, TROPONINI,  in the last 168 hours BNP (last 3 results)  Recent Labs  05/07/13 1012 10/16/13 1407 03/24/14 0852  PROBNP 633.5* 228.6* 692.8*    CBG:  Recent Labs Lab 03/30/14 0804 03/30/14 1148 03/30/14 1600 03/30/14 2007 03/31/14 0745  GLUCAP 239* 247* 122* 132* 143*    Recent Results (from the past 240 hour(s))  CULTURE, BLOOD (ROUTINE X 2)     Status: None   Collection Time    03/24/14  5:55 AM      Result Value Ref Range Status   Specimen Description BLOOD FOREARM RIGHT   Final   Special Requests BOTTLES DRAWN AEROBIC ONLY 3ML   Final   Culture  Setup Time     Final   Value: 03/24/2014 14:03     Performed at [facility]   Culture     Final   Value: NO GROWTH 5 DAYS     Performed at [facility]   Report Status 03/30/2014 FINAL   Final  CULTURE, BLOOD (ROUTINE X 2)     Status: None   Collection Time    03/24/14  5:55 AM      Result Value Ref Range Status   Specimen Description BLOOD HAND RIGHT   Final   Special Requests BOTTLES DRAWN AEROBIC ONLY 1.5ML   Final   Culture  Setup Time     Final   Value: 03/24/2014 14:03     Performed at [facility]   Culture     Final   Value: NO GROWTH 5 DAYS     Note: Culture results may be compromised due to an inadequate volume of blood received in culture bottles.     Performed at [facility]   Report Status 03/30/2014 FINAL   Final  URINE CULTURE      Status: None   Collection Time    03/29/14 10:37 AM      Result Value Ref Range Status   Specimen Description URINE, CATHETERIZED   Final   Special Requests  ADDED 2100   Final   Culture  Setup Time     Final   Value: 03/29/2014 21:06     Performed at [facility]     Final   Value: NO GROWTH     Performed at [facility]   Culture     Final   Value: NO GROWTH     Performed at [facility]   Report Status 03/31/2014 FINAL   Final  MRSA PCR SCREENING     Status: None   Collection Time    03/29/14  3:50 PM      Result Value Ref Range Status   MRSA by PCR NEGATIVE  NEGATIVE Final   Comment:            The GeneXpert MRSA Assay (FDA     approved for NASAL specimens     only), is one component of a     comprehensive MRSA colonization     surveillance program. It is not     intended to diagnose MRSA     infection nor to guide or     monitor treatment for     MRSA infections.  CULTURE, BLOOD (ROUTINE X 2)     Status: None   Collection Time    03/29/14  3:56 PM      Result Value Ref Range Status   Specimen Description BLOOD LEFT HAND   Final   Special Requests BOTTLES DRAWN AEROBIC AND ANAEROBIC 10CC   Final   Culture  Setup Time     Final   Value: 03/29/2014 21:49     Performed at [facility]   Culture     Final   Value:        BLOOD CULTURE RECEIVED NO GROWTH TO DATE CULTURE WILL BE HELD FOR 5 DAYS BEFORE ISSUING A FINAL NEGATIVE REPORT     Performed at [facility]   Report Status PENDING   Incomplete  CULTURE, BLOOD (ROUTINE X 2)     Status: None   Collection Time    03/29/14  4:09 PM      Result Value Ref Range Status   Specimen Description BLOOD LEFT ARM   Final   Special Requests BOTTLES DRAWN AEROBIC AND ANAEROBIC 10CC   Final   Culture  Setup Time     Final   Value: 03/29/2014 21:48     Performed at [facility]   Culture     Final   Value:        BLOOD CULTURE RECEIVED NO GROWTH TO DATE CULTURE WILL  BE HELD FOR 5 DAYS BEFORE ISSUING A FINAL NEGATIVE REPORT     Performed at [facility]   Report Status PENDING   Incomplete     Studies:  Noted  Scheduled Meds:  Scheduled Meds: . enoxaparin (LOVENOX) injection  0.5 mg/kg Subcutaneous Daily  . glimepiride  4 mg Oral Q breakfast  . imipenem-cilastatin  500 mg Intravenous Q6H  . insulin aspart  0-15 Units Subcutaneous TID WC  . insulin aspart  0-5 Units Subcutaneous QHS  . insulin glargine  10 Units Subcutaneous BID  . simvastatin  5 mg Oral q1800  . sodium chloride  3 mL Intravenous Q12H  . vancomycin  1,500 mg Intravenous Q12H    Time spent on care of this patient: 35 mins  Lala Lund K M.D on 03/31/2014 at 10:02 AM  Between 7am to 7pm - Pager -  [phone], After 7pm go to [email or web address] - password [email or web address]  And look for the night coverage person covering me after hours  [place]   LOS: 2 days

## 2014-03-31 NOTE — Progress Notes (Signed)
OT Cancellation Note  Patient Details Name: Michael Martinez MRN: [id number] DOB: 02-04-1956   Cancelled Treatment:    Reason Eval/Treat Not Completed: OT screened, no needs identified, will sign off - spoke with pt and he does not feel he needs OT.  He reports he is able to perform ADLs mod I.    Darlina Rumpf [place], OTR/L [phone]  03/31/2014, 2:04 PM

## 2014-03-31 NOTE — Evaluation (Signed)
Physical Therapy Evaluation Patient Details Name: Michael Martinez MRN: [id number] DOB: 05-11-1956 Today's Date: 03/31/2014   History of Present Illness  Patient is a 58 y/o male with history of gout, DVT, CHF and recurrent cellulitis of RLE, who presents to ED with complaint of fever and generalized malaise. Pt recently admitted to the hospital, discharged 3 days ago where he was treated for cellulitis of right lower leg. Doppler was negative for DVT on 9/9. Patient was treated on vancomycin while in the hospital, discharged with doxycycline. The patient has had cellulitis in this leg four times in the past year.   Clinical Impression  Patient presents with swelling in RLE however able to tolerate ambulating community distances and negotiating steps without difficulty. Pt functioning close to baseline and does not require skilled therapy services. Pt owns SPC and RW at home if needed however normally is independent for all mobility. Mild SOB noted with gait secondary to swelling however pt to start scheduled lasix (takes it at home) which should improve swelling and overall dyspnea with exertion. Discharge from therapy at this time. No needs.     Follow Up Recommendations No PT follow up    Equipment Recommendations  None recommended by PT    Recommendations for Other Services       Precautions / Restrictions Precautions Precautions: None Restrictions Weight Bearing Restrictions: No      Mobility  Bed Mobility               General bed mobility comments: Received sitting in chair upon PT arrival.   Transfers Overall transfer level: Independent Equipment used: None                Ambulation/Gait Ambulation/Gait assistance: Independent Ambulation Distance (Feet): 200 Feet Assistive device: None Gait Pattern/deviations: Wide base of support;Step-through pattern;Decreased stride length   Gait velocity interpretation: at or above normal speed for age/gender General  Gait Details: Safe, steady gait noted. Pt reports dyspnea secondary to swelling in RLE. Lasix just ordered to start this AM- normally takes lasix at home and usually does not have SOB with activity.   Stairs Stairs: Yes Stairs assistance: Modified independent (Device/Increase time) Stair Management: One rail Right Number of Stairs: 4 General stair comments: No difficulty.   Wheelchair Mobility    Modified Rankin (Stroke Patients Only)       Balance Overall balance assessment: Independent                                           Pertinent Vitals/Pain Pain Assessment: No/denies pain    Home Living Family/patient expects to be discharged to:: Private residence Living Arrangements: Spouse/significant other Available Help at Discharge: Family;Available PRN/intermittently Type of Home: House Home Access: Stairs to enter Entrance Stairs-Rails: Right Entrance Stairs-Number of Steps: 3 Home Layout: One level Home Equipment: Walker - 2 wheels;Cane - single point      Prior Function Level of Independence: Independent         Comments: Very active at home - works in yard. Drives. Travels as a Theme park manager.     Hand Dominance        Extremity/Trunk Assessment   Upper Extremity Assessment: Overall WFL for tasks assessed           Lower Extremity Assessment: Overall WFL for tasks assessed;RLE deficits/detail RLE Deficits / Details: Pitting edema RLE 2+  Communication   Communication: No difficulties  Cognition Arousal/Alertness: Awake/alert Behavior During Therapy: WFL for tasks assessed/performed Overall Cognitive Status: Within Functional Limits for tasks assessed                      General Comments General comments (skin integrity, edema, etc.): RLE swelling lower leg distal to knee with mild warmth and erythema.    Exercises        Assessment/Plan    PT Assessment Patent does not need any further PT services  PT  Diagnosis     PT Problem List    PT Treatment Interventions     PT Goals (Current goals can be found in the Care Plan section) Acute Rehab PT Goals PT Goal Formulation: No goals set, d/c therapy    Frequency     Barriers to discharge        Co-evaluation               End of Session   Activity Tolerance: Patient tolerated treatment well Patient left: in chair;with call bell/phone within reach Nurse Communication: Mobility status         Time: 1000-1015 PT Time Calculation (min): 15 min   Charges:   PT Evaluation $Initial PT Evaluation Tier I: 1 Procedure PT Treatments $Gait Training: 8-22 mins   PT G CodesCandy Sledge A 03/31/2014, 10:18 AM Candy Sledge, PT, DPT [phone]

## 2014-03-31 NOTE — Progress Notes (Signed)
Pt has home CPAP and places himself on and off when ready.

## 2014-03-31 NOTE — Progress Notes (Signed)
ANTIBIOTIC CONSULT NOTE - FOLLOW UP  Pharmacy Consult for Vancomycin + Primaxin Indication: RLE cellulitis  Allergies  Allergen Reactions  . Penicillins     Pt complained of chest tightness, pt very diaphoretic, pt chest, arms red, difficulty breathing.  Marland Kitchen Zosyn [Piperacillin Sod-Tazobactam So]     Patient Measurements: Height: 6' (182.9 cm) Weight: 395 lb 11.6 oz (179.5 kg) IBW/kg (Calculated) : 77.6  Vital Signs: Temp: 98.3 F (36.8 C) (09/15 1055) Temp src: Oral (09/15 1055) BP: 131/77 mmHg (09/15 1055) Pulse Rate: 84 (09/15 1055) Intake/Output from previous day: 09/14 0701 - 09/15 0700 In: 1203 [P.O.:1200; I.V.:3] Out: 400 [Urine:400] Intake/Output from this shift: Total I/O In: 480 [P.O.:480] Out: -   Labs:  Recent Labs  03/29/14 1556 03/30/14 0500 03/31/14 0517  WBC 14.7* 11.9* 8.6  HGB 13.4 12.1* 11.5*  PLT 265 272 312  CREATININE 1.10 1.07 1.18   Estimated Creatinine Clearance: 111.5 ml/min (by C-G formula based on Cr of 1.18).  Recent Labs  03/31/14 1248  [facility] 17.3     Microbiology: Recent Results (from the past 720 hour(s))  CULTURE, BLOOD (ROUTINE X 2)     Status: None   Collection Time    03/24/14  5:55 AM      Result Value Ref Range Status   Specimen Description BLOOD FOREARM RIGHT   Final   Special Requests BOTTLES DRAWN AEROBIC ONLY 3ML   Final   Culture  Setup Time     Final   Value: 03/24/2014 14:03     Performed at [facility]   Culture     Final   Value: NO GROWTH 5 DAYS     Performed at [facility]   Report Status 03/30/2014 FINAL   Final  CULTURE, BLOOD (ROUTINE X 2)     Status: None   Collection Time    03/24/14  5:55 AM      Result Value Ref Range Status   Specimen Description BLOOD HAND RIGHT   Final   Special Requests BOTTLES DRAWN AEROBIC ONLY 1.5ML   Final   Culture  Setup Time     Final   Value: 03/24/2014 14:03     Performed at [facility]   Culture     Final   Value: NO GROWTH  5 DAYS     Note: Culture results may be compromised due to an inadequate volume of blood received in culture bottles.     Performed at [facility]   Report Status 03/30/2014 FINAL   Final  URINE CULTURE     Status: None   Collection Time    03/29/14 10:37 AM      Result Value Ref Range Status   Specimen Description URINE, CATHETERIZED   Final   Special Requests ADDED 2100   Final   Culture  Setup Time     Final   Value: 03/29/2014 21:06     Performed at [place]     Final   Value: NO GROWTH     Performed at [facility]   Culture     Final   Value: NO GROWTH     Performed at [facility]   Report Status 03/31/2014 FINAL   Final  MRSA PCR SCREENING     Status: None   Collection Time    03/29/14  3:50 PM      Result Value Ref Range Status   MRSA by PCR  NEGATIVE  NEGATIVE Final   Comment:            The GeneXpert MRSA Assay (FDA     approved for NASAL specimens     only), is one component of a     comprehensive MRSA colonization     surveillance program. It is not     intended to diagnose MRSA     infection nor to guide or     monitor treatment for     MRSA infections.  CULTURE, BLOOD (ROUTINE X 2)     Status: None   Collection Time    03/29/14  3:56 PM      Result Value Ref Range Status   Specimen Description BLOOD LEFT HAND   Final   Special Requests BOTTLES DRAWN AEROBIC AND ANAEROBIC 10CC   Final   Culture  Setup Time     Final   Value: 03/29/2014 21:49     Performed at [facility]   Culture     Final   Value:        BLOOD CULTURE RECEIVED NO GROWTH TO DATE CULTURE WILL BE HELD FOR 5 DAYS BEFORE ISSUING A FINAL NEGATIVE REPORT     Performed at [facility]   Report Status PENDING   Incomplete  CULTURE, BLOOD (ROUTINE X 2)     Status: None   Collection Time    03/29/14  4:09 PM      Result Value Ref Range Status   Specimen Description BLOOD LEFT ARM   Final   Special Requests BOTTLES DRAWN  AEROBIC AND ANAEROBIC 10CC   Final   Culture  Setup Time     Final   Value: 03/29/2014 21:48     Performed at [facility]   Culture     Final   Value:        BLOOD CULTURE RECEIVED NO GROWTH TO DATE CULTURE WILL BE HELD FOR 5 DAYS BEFORE ISSUING A FINAL NEGATIVE REPORT     Performed at [facility]   Report Status PENDING   Incomplete    Anti-infectives   Start     Dose/Rate Route Frequency Ordered Stop   03/30/14 0100  vancomycin (VANCOCIN) 1,500 mg in sodium chloride 0.9 % 500 mL IVPB     1,500 mg 250 mL/hr over 120 Minutes Intravenous Every 12 hours 03/29/14 1231     03/29/14 1900  piperacillin-tazobactam (ZOSYN) IVPB 3.375 g  Status:  Discontinued     3.375 g 12.5 mL/hr over 240 Minutes Intravenous Every 8 hours 03/29/14 1241 03/29/14 1457   03/29/14 1600  imipenem-cilastatin (PRIMAXIN) 500 mg in sodium chloride 0.9 % 100 mL IVPB     500 mg 200 mL/hr over 30 Minutes Intravenous Every 6 hours 03/29/14 1458     03/29/14 1534  valACYclovir (VALTREX) tablet 500 mg     500 mg Oral 2 times daily PRN 03/29/14 1534     03/29/14 1300  piperacillin-tazobactam (ZOSYN) IVPB 3.375 g     3.375 g 100 mL/hr over 30 Minutes Intravenous  Once 03/29/14 1241 03/29/14 1339   03/29/14 1245  vancomycin (VANCOCIN) 2,500 mg in sodium chloride 0.9 % 500 mL IVPB     2,500 mg 250 mL/hr over 120 Minutes Intravenous  Once 03/29/14 1231 03/29/14 1850   03/29/14 1145  clindamycin (CLEOCIN) IVPB 600 mg     600 mg 100 mL/hr over 30 Minutes Intravenous  Once 03/29/14 1143 03/29/14 1339  Assessment: 60 YOM who continues on Vancomycin + Primaxin for RLE cellulitis with a slightly SUPRAtherapeutic Vancomycin trough this afternoon (VT 17.3, goal of 10-15 mcg/ml with cellulitis). Afebrile, WBC wnl, renal function stable. Will reduce Vanc dose slightly to target goal range, Primaxin dose remains appropriate.   Goal of Therapy:  Vancomycin trough level 10-15 mcg/ml  Plan:  1. Reduce  Vancomycin to 1250 mg IV every 12 hours 2. Continue Primaxin 500 mg IV every 6 hours 3. Will continue to follow renal function, culture results, LOT, and antibiotic de-escalation plans   Alycia Rossetti, PharmD, BCPS Clinical Pharmacist Pager: [phone] 03/31/2014 2:02 PM

## 2014-04-01 LAB — BASIC METABOLIC PANEL
ANION GAP: 10 (ref 5–15)
BUN: 20 mg/dL (ref 6–23)
CO2: 29 mEq/L (ref 19–32)
Calcium: 8.5 mg/dL (ref 8.4–10.5)
Chloride: 98 mEq/L (ref 96–112)
Creatinine, Ser: 1.16 mg/dL (ref 0.50–1.35)
GFR, EST AFRICAN AMERICAN: 77 mL/min — AB (ref >90)
GFR, EST NON AFRICAN AMERICAN: 67 mL/min — AB (ref >90)
Glucose, Bld: 119 mg/dL — ABNORMAL HIGH (ref 70–99)
POTASSIUM: 3.9 meq/L (ref 3.7–5.3)
Sodium: 137 mEq/L (ref 137–147)

## 2014-04-01 LAB — GLUCOSE, CAPILLARY: Glucose-Capillary: 132 mg/dL — ABNORMAL HIGH (ref 70–99)

## 2014-04-01 MED ORDER — NAPROXEN 500 MG PO TABS: 500.0000 mg | ORAL_TABLET | Freq: Two times a day (BID) | ORAL | Status: DC | PRN

## 2014-04-01 MED ORDER — SULFAMETHOXAZOLE-TMP DS 800-160 MG PO TABS: 1.0000 | ORAL_TABLET | Freq: Two times a day (BID) | ORAL | Status: DC

## 2014-04-01 NOTE — Progress Notes (Signed)
CARE MANAGEMENT NOTE 04/01/2014  Patient:  Michael Martinez, Michael Martinez   Account Number:  [id number]  Date Initiated:  03/31/2014  Documentation initiated by:  [facility]  Subjective/Objective Assessment:   cellulitis     Action/Plan:   Anticipated DC Date:  04/01/2014   Anticipated DC Plan:  [place]  CM consult      Choice offered to / List presented to:             Status of service:  Completed, signed off Medicare Important Message given?   (If response is "NO", the following Medicare IM given date fields will be blank) Date Medicare IM given:   Medicare IM given by:   Date Additional Medicare IM given:   Additional Medicare IM given by:    Discharge Disposition:  HOME/SELF CARE  Per UR Regulation:  Reviewed for med. necessity/level of care/duration of stay  If discussed at [place] of Stay Meetings, dates discussed:    Comments:  04/01/2014  [place], Charles Mix Met with patient regarding medical follow up post discharge. He has an appointment with Dr Rutherford Nail 04/16/2014.  Suggested he call the office for an earlier appointment due to hosipital admission. He goes to [facility] for his medications. He does not have insurance and has been able to pay for the generic medications.[facility] helps with his hospital costs, not the MD visits. Discussed a community clinic for his follow up . [facility] and [employer]: contact information given to patient  [phone].

## 2014-04-01 NOTE — Discharge Summary (Signed)
Physician Discharge Summary  NUR KRASINSKI EUM:[id number] DOB: [birth year] DOA: 03/29/2014  PCP: Ashok Norris, MD  Admit date: 03/29/2014 Discharge date: 04/01/2014  Time spent: greater than 30 minutes  Discharge Diagnoses:  Active Problems:   Cellulitis, right leg, recurrent UTI Acute on chronic systolic heart failure DM2 HTN Morbid obesity OSA HLD  Discharge Condition: stable  Filed Weights   03/30/14 0408 03/30/14 2003 03/31/14 2116  Weight: 179.5 kg (395 lb 11.6 oz) 179.5 kg (395 lb 11.6 oz) 179.5 kg (395 lb 11.6 oz)    History of present illness:  58 y.o. male with history of gout, DVT, CHF, recurrent cellulitis of right leg, who presents to ED. With complaint fever, generalized malaise. Pt recently admitted to the hospital, discharged 3 days ago. Was treated for cellulitis of right lower leg. Doppler was negative for DVT on 9/9. Patient was treated on vancomycin while in the hospital, discharged with doxycycline. Patient states when he left the hospital he was feeling much better. States he has been taking antibiotics as prescribed. Yesterday he developed fever again of 102, chills, rigors. Comes back in with worsening fever redness despite taking oral antibiotics.The patient has had cellulitis in this leg four times in the past year  Hospital Course:  R LE recurrent cellulitis failed outpatient doxycycline  Improved after several days vancomycin and imipenum Venous dopplers negative for DVT 03/25/2014 upon last admission.   UTI  Urine culture negative  DM2  A1c 6.9 - on Amaryl, Lantus and sliding-scale. CBGs stable.   Mild acute on chronic Systolic and diastolic CHF  EF 54-00% via TTE 02/14/2014 - had mild volume overload initially and was diuresed in step-down, currently appears compensated. Place on fluid salt restriction lung with home dose Lasix.  HTN  BP currently well controlled, not on any meds.   OSA  On CPAP at home   HLD  Cont home med tx  MorbidObesity  - Body mass index is 53.66 kg/(m^2).   Procedures:  none  Consultations:  none  Discharge Exam: Filed Vitals:   04/01/14 0520  BP: 109/66  Pulse: 86  Temp: 98.4 F (36.9 C)  Resp: 20    General: nontoxic, alert and oriented Cardiovascular: RRR without MGR Respiratory: CTA Ext minimal erythema  Discharge Instructions   Activity as tolerated - No restrictions    Complete by:  As directed      Diet - low sodium heart healthy    Complete by:  As directed      Diet Carb Modified    Complete by:  As directed           Current Discharge Medication List    START taking these medications   Details  sulfamethoxazole-trimethoprim (BACTRIM DS) 800-160 MG per tablet Take 1 tablet by mouth 2 (two) times daily. Until gone Qty: 10 tablet, Refills: 0      CONTINUE these medications which have CHANGED   Details  naproxen (NAPROSYN) 500 MG tablet Take 1 tablet (500 mg total) by mouth 2 (two) times daily as needed for mild pain.      CONTINUE these medications which have NOT CHANGED   Details  acetaminophen (TYLENOL) 500 MG tablet Take 1,000 mg by mouth every 6 (six) hours as needed for mild pain.    diazepam (VALIUM) 5 MG tablet Take 5 mg by mouth every 8 (eight) hours as needed. For muscle spasm    furosemide (LASIX) 20 MG tablet Take 20 mg by mouth 2 (two) times daily.  glimepiride (AMARYL) 4 MG tablet Take 4 mg by mouth daily with breakfast.    HYDROcodone-acetaminophen (NORCO/VICODIN) 5-325 MG per tablet Take 1 tablet by mouth every 8 (eight) hours as needed for pain.    ibuprofen (ADVIL,MOTRIN) 200 MG tablet Take 400 mg by mouth every 6 (six) hours as needed for moderate pain.    indomethacin (INDOCIN) 25 MG capsule Take 25 mg by mouth daily as needed (gout). For 3 days    losartan (COZAAR) 100 MG tablet Take 100 mg by mouth daily.    lovastatin (MEVACOR) 40 MG tablet Take 40 mg by mouth at bedtime.    metFORMIN (GLUCOPHAGE) 500 MG tablet Take 1,000 mg by mouth  2 (two) times daily with a meal.     valACYclovir (VALTREX) 500 MG tablet Take 1 tablet (500 mg total) by mouth 2 (two) times daily as needed (for fever blisters). For 5 days Qty: 20 tablet, Refills: 0    vardenafil (LEVITRA) 20 MG tablet Take 20 mg by mouth daily as needed for erectile dysfunction.      STOP taking these medications     doxycycline (VIBRAMYCIN) 50 MG capsule        Allergies  Allergen Reactions  . Penicillins     Pt complained of chest tightness, pt very diaphoretic, pt chest, arms red, difficulty breathing.  Marland Kitchen Zosyn [Piperacillin Sod-Tazobactam So]    Follow-up Information   Follow up with MORRISEY,LEMONT, MD. (as scheduled)    Specialty:  Family Medicine   Contact information:   [facility] [address] [place] [address] [phone]        The results of significant diagnostics from this hospitalization (including imaging, microbiology, ancillary and laboratory) are listed below for reference.    Significant Diagnostic Studies: No results found.  Microbiology: Recent Results (from the past 240 hour(s))  CULTURE, BLOOD (ROUTINE X 2)     Status: None   Collection Time    03/24/14  5:55 AM      Result Value Ref Range Status   Specimen Description BLOOD FOREARM RIGHT   Final   Special Requests BOTTLES DRAWN AEROBIC ONLY 3ML   Final   Culture  Setup Time     Final   Value: 03/24/2014 14:03     Performed at [facility]   Culture     Final   Value: NO GROWTH 5 DAYS     Performed at [facility]   Report Status 03/30/2014 FINAL   Final  CULTURE, BLOOD (ROUTINE X 2)     Status: None   Collection Time    03/24/14  5:55 AM      Result Value Ref Range Status   Specimen Description BLOOD HAND RIGHT   Final   Special Requests BOTTLES DRAWN AEROBIC ONLY 1.5ML   Final   Culture  Setup Time     Final   Value: 03/24/2014 14:03     Performed at [facility]   Culture     Final   Value: NO  GROWTH 5 DAYS     Note: Culture results may be compromised due to an inadequate volume of blood received in culture bottles.     Performed at [facility]   Report Status 03/30/2014 FINAL   Final  URINE CULTURE     Status: None   Collection Time    03/29/14 10:37 AM      Result Value Ref Range Status   Specimen Description  URINE, CATHETERIZED   Final   Special Requests ADDED 2100   Final   Culture  Setup Time     Final   Value: 03/29/2014 21:06     Performed at [place]     Final   Value: NO GROWTH     Performed at [facility]   Culture     Final   Value: NO GROWTH     Performed at [facility]   Report Status 03/31/2014 FINAL   Final  MRSA PCR SCREENING     Status: None   Collection Time    03/29/14  3:50 PM      Result Value Ref Range Status   MRSA by PCR NEGATIVE  NEGATIVE Final   Comment:            The GeneXpert MRSA Assay (FDA     approved for NASAL specimens     only), is one component of a     comprehensive MRSA colonization     surveillance program. It is not     intended to diagnose MRSA     infection nor to guide or     monitor treatment for     MRSA infections.  CULTURE, BLOOD (ROUTINE X 2)     Status: None   Collection Time    03/29/14  3:56 PM      Result Value Ref Range Status   Specimen Description BLOOD LEFT HAND   Final   Special Requests BOTTLES DRAWN AEROBIC AND ANAEROBIC 10CC   Final   Culture  Setup Time     Final   Value: 03/29/2014 21:49     Performed at [facility]   Culture     Final   Value:        BLOOD CULTURE RECEIVED NO GROWTH TO DATE CULTURE WILL BE HELD FOR 5 DAYS BEFORE ISSUING A FINAL NEGATIVE REPORT     Performed at [facility]   Report Status PENDING   Incomplete  CULTURE, BLOOD (ROUTINE X 2)     Status: None   Collection Time    03/29/14  4:09 PM      Result Value Ref Range Status   Specimen Description BLOOD LEFT ARM   Final   Special Requests BOTTLES DRAWN  AEROBIC AND ANAEROBIC 10CC   Final   Culture  Setup Time     Final   Value: 03/29/2014 21:48     Performed at [facility]   Culture     Final   Value:        BLOOD CULTURE RECEIVED NO GROWTH TO DATE CULTURE WILL BE HELD FOR 5 DAYS BEFORE ISSUING A FINAL NEGATIVE REPORT     Performed at [facility]   Report Status PENDING   Incomplete     Labs: Basic Metabolic Panel:  Recent Labs Lab 03/29/14 1030 03/29/14 1556 03/30/14 0500 03/31/14 0517 04/01/14 0544  NA 131*  --  134* 139 137  K 4.5  --  4.4 4.0 3.9  CL 92*  --  98 102 98  CO2 27  --  25 26 29   GLUCOSE 224*  --  289* 143* 119*  BUN 14  --  20 22 20   CREATININE 1.11 1.10 1.07 1.18 1.16  CALCIUM 8.9  --  8.4 8.2* 8.5  MG  --  1.7  --   --   --    Liver Function  Tests:  Recent Labs Lab 03/29/14 1030 03/29/14 1556 03/30/14 0500  AST 19 18 10   ALT 36 32 24  ALKPHOS 100 101 103  BILITOT 0.9 1.0 0.4  PROT 6.9 6.7 6.0  ALBUMIN 3.0* 2.8* 2.5*   No results found for this basename: LIPASE, AMYLASE,  in the last 168 hours No results found for this basename: AMMONIA,  in the last 168 hours CBC:  Recent Labs Lab 03/29/14 1030 03/29/14 1556 03/30/14 0500 03/31/14 0517  WBC 12.5* 14.7* 11.9* 8.6  NEUTROABS 10.7*  --   --   --   HGB 13.2 13.4 12.1* 11.5*  HCT 38.9* 38.9* 35.3* 34.3*  MCV 85.7 84.7 85.3 86.6  PLT 266 265 272 312   Cardiac Enzymes: No results found for this basename: CKTOTAL, CKMB, CKMBINDEX, TROPONINI,  in the last 168 hours BNP: BNP (last 3 results)  Recent Labs  05/07/13 1012 10/16/13 1407 03/24/14 0852  PROBNP 633.5* 228.6* 692.8*   CBG:  Recent Labs Lab 03/31/14 0745 03/31/14 1208 03/31/14 1634 03/31/14 2114 04/01/14 0812  GLUCAP 143* 124* 145* 148* 132*       Signed:  [place]  [facility] 04/01/2014, 9:58 AM

## 2014-04-01 NOTE — Progress Notes (Signed)
Discharge education completed by RN. Pt received a copy of discharge paperwork and confirm understanding of follow up appointments and discharge medications.Patient denies any questions at this time. IV removed, site is within normal limits. Pt will discharge from the unit via wheelchair.

## 2014-04-04 LAB — CULTURE, BLOOD (ROUTINE X 2)
CULTURE: NO GROWTH
Culture: NO GROWTH

## 2014-11-12 ENCOUNTER — Encounter: Payer: Self-pay | Admitting: Cardiovascular Disease

## 2014-11-12 ENCOUNTER — Ambulatory Visit (INDEPENDENT_AMBULATORY_CARE_PROVIDER_SITE_OTHER): Payer: Self-pay | Admitting: Cardiovascular Disease

## 2014-11-12 VITALS — BP 124/82 | HR 91 | Ht 72.0 in | Wt >= 6400 oz

## 2014-11-12 DIAGNOSIS — G4733 Obstructive sleep apnea (adult) (pediatric): Secondary | ICD-10-CM

## 2014-11-12 DIAGNOSIS — I10 Essential (primary) hypertension: Secondary | ICD-10-CM

## 2014-11-12 DIAGNOSIS — Z9989 Dependence on other enabling machines and devices: Secondary | ICD-10-CM

## 2014-11-12 DIAGNOSIS — R0602 Shortness of breath: Secondary | ICD-10-CM

## 2014-11-12 DIAGNOSIS — IMO0002 Reserved for concepts with insufficient information to code with codable children: Secondary | ICD-10-CM

## 2014-11-12 DIAGNOSIS — I5032 Chronic diastolic (congestive) heart failure: Secondary | ICD-10-CM

## 2014-11-12 DIAGNOSIS — E1165 Type 2 diabetes mellitus with hyperglycemia: Secondary | ICD-10-CM

## 2014-11-12 NOTE — Assessment & Plan Note (Addendum)
Shortness of breath is less likely ischemia, most likely consistent with chronic diastolic CHF, obesity, deconditioning. Review of his CT scan with him, the actual images, showed minimal if any atherosclerosis. Only a small amount in the aortic arch. No significant coronary calcification concerning for calcified plaque. We have recommended if symptoms get worse that he call our office. Testing could be performed

## 2014-11-12 NOTE — Assessment & Plan Note (Signed)
Blood pressure is well controlled on today's visit. No changes made to the medications. 

## 2014-11-12 NOTE — Assessment & Plan Note (Signed)
Recommended he continue on his torsemide 20-40 mg daily, try to restrict his fluid intake

## 2014-11-12 NOTE — Patient Instructions (Signed)
You are doing well. No medication changes were made.  Please call us if you have new issues that need to be addressed before your next appt.  Your physician wants you to follow-up in: 12 months.  You will receive a reminder letter in the mail two months in advance. If you don't receive a letter, please call our office to schedule the follow-up appointment. 

## 2014-11-12 NOTE — Progress Notes (Signed)
Patient ID: Michael Martinez, male    DOB: Nov 04, 1955, 59 y.o.   MRN: [id number]  HPI Comments: Mr. Michael Martinez is a 59 year old gentleman with morbid obesity, obstructive sleep apnea who uses a CPAP, diabetes type 2 for the past 20 years, history of DVT, who presents for routine follow-up of his shortness of breath.  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 On his visit with [place], he had a bronchospastic cough. He was seen by pulmonary 12/03/2013, started on prednisone, medication changes made, cough syrup with improvement of his symptoms.  He reports having cellulitis September 2015  Since then he has felt fatigued, had some shortness of breath, battle with his fluid/edema. Lasix changed to torsemide with some improvement of his leg swelling. He takes 20-40 mg torsemide daily for leg edema. He does have significant by mouth fluid intake. Weight continues to be an issue  EKG on today's visit shows normal sinus rhythm with rate 91 bpm, no significant ST or T-wave changes, small voltage in the limb leads  Other past medical history Prior CT scan in 2015 showing no PE, minimal atherosclerosis in the aortic arch  echocardiogram 2015 showing mildly depressed ejection fraction, 45-50%, otherwise no significant valvular disease, normal right ventricular systolic pressures.  Allergies  Allergen Reactions  . Penicillins     Pt complained of chest tightness, pt very diaphoretic, pt chest, arms red, difficulty breathing.  Marland Kitchen Zosyn [Piperacillin Sod-Tazobactam So]     Outpatient Encounter Prescriptions as of 11/12/2014  Medication Sig  . acetaminophen (TYLENOL) 500 MG tablet Take 1,000 mg by mouth every 6 (six) hours as needed for mild pain.  . diazepam (VALIUM) 5 MG tablet Take 5 mg by mouth every 8 (eight) hours as needed. For muscle spasm  . glimepiride (AMARYL)  4 MG tablet Take 4 mg by mouth daily with breakfast.  . HYDROcodone-acetaminophen (NORCO/VICODIN) 5-325 MG per tablet Take 1 tablet by mouth every 8 (eight) hours as needed for pain.  Marland Kitchen ibuprofen (ADVIL,MOTRIN) 200 MG tablet Take 400 mg by mouth every 6 (six) hours as needed for moderate pain.  . indomethacin (INDOCIN) 25 MG capsule Take 25 mg by mouth daily as needed (gout). For 3 days  . losartan (COZAAR) 100 MG tablet Take 100 mg by mouth daily.  Marland Kitchen lovastatin (MEVACOR) 40 MG tablet Take 40 mg by mouth at bedtime.  . metFORMIN (GLUCOPHAGE) 500 MG tablet Take 1,000 mg by mouth 2 (two) times daily with a meal.   . naproxen (NAPROSYN) 500 MG tablet Take 1 tablet (500 mg total) by mouth 2 (two) times daily as needed for mild pain.  Marland Kitchen torsemide (DEMADEX) 20 MG tablet Take 1-2 tablets daily.  . valACYclovir (VALTREX) 500 MG tablet Take 1 tablet (500 mg total) by mouth 2 (two) times daily as needed (for fever blisters). For 5 days  . vardenafil (LEVITRA) 20 MG tablet Take 20 mg by mouth daily as needed for erectile dysfunction.    Past Medical History  Diagnosis Date  . DVT (deep venous thrombosis) 2006    "after ankle surgery"  . Gout   . Obesity   . Hypertension   . Hypercholesteremia   . CHF (congestive heart failure)   . OSA on CPAP   . Peripheral edema     chronic  . Clotting disorder     deep vein thrombosis left leg  .  Melanoma     left ear  . Shortness of breath   . Cellulitis and abscess of lower extremity 03/24/2014    RT LEG  . Diabetes mellitus     TYPE 2  . GERD (gastroesophageal reflux disease)   . Arthritis     RA    Past Surgical History  Procedure Laterality Date  . Hernia repair    . Tonsillectomy    . Skin cancer excision      Social History  reports that he has never smoked. He has never used smokeless tobacco. He reports that he does not drink alcohol or use illicit drugs.  Family History family history includes Asthma in his daughter, mother, sister,  and sister; Hypertension in his father and mother; Stroke in his mother; Tuberculosis in his mother.  Review of Systems  Constitutional: Negative.   HENT: Negative.   Respiratory: Positive for shortness of breath.   Cardiovascular: Positive for leg swelling.  Gastrointestinal: Negative.   Musculoskeletal: Negative.   Skin: Negative.   Neurological: Negative.   Hematological: Negative.   Psychiatric/Behavioral: Negative.   All other systems reviewed and are negative.   BP 124/82 mmHg  Pulse 91  Ht 6' (1.829 m)  Wt 414 lb 4 oz (187.903 kg)  BMI 56.17 kg/m2  Physical Exam  Constitutional: He is oriented to person, place, and time. He appears well-developed and well-nourished.  Morbid obesity  HENT:  Head: Normocephalic.  Nose: Nose normal.  Mouth/Throat: Oropharynx is clear and moist.  Eyes: Conjunctivae are normal. Pupils are equal, round, and reactive to light.  Neck: Normal range of motion. Neck supple. No JVD present.  Cardiovascular: Normal rate, regular rhythm, S1 normal, S2 normal, normal heart sounds and intact distal pulses.  Exam reveals no gallop and no friction rub.   No murmur heard. Trace pitting leg edema  Pulmonary/Chest: Effort normal and breath sounds normal. No respiratory distress. He has no wheezes. He has no rales. He exhibits no tenderness.  Abdominal: Soft. Bowel sounds are normal. He exhibits no distension. There is no tenderness.  Musculoskeletal: Normal range of motion. He exhibits no edema or tenderness.  Lymphadenopathy:    He has no cervical adenopathy.  Neurological: He is alert and oriented to person, place, and time. Coordination normal.  Skin: Skin is warm and dry. No rash noted. No erythema.  Psychiatric: He has a normal mood and affect. His behavior is normal. Judgment and thought content normal.      Assessment and Plan   Nursing note and vitals reviewed.

## 2014-11-12 NOTE — Assessment & Plan Note (Signed)
We have encouraged continued exercise, careful diet management in an effort to lose weight. 

## 2014-11-12 NOTE — Assessment & Plan Note (Signed)
We spent some time discussing various options for weight loss. He will track his calories using smart phone at, recommended he start a regular walking program. He does have a gym membership.

## 2014-11-12 NOTE — Assessment & Plan Note (Signed)
Recommended compliance with his sleep apnea

## 2015-01-11 ENCOUNTER — Other Ambulatory Visit: Payer: Self-pay

## 2015-02-12 ENCOUNTER — Other Ambulatory Visit: Payer: Self-pay

## 2015-02-12 MED ORDER — VARDENAFIL HCL 20 MG PO TABS: 20.0000 mg | ORAL_TABLET | Freq: Every day | ORAL | Status: DC | PRN

## 2015-02-16 ENCOUNTER — Telehealth: Payer: Self-pay | Admitting: Family Medicine

## 2015-02-16 NOTE — Telephone Encounter (Signed)
Pt requesting refill on Levitra. Please send to [facility]

## 2015-02-24 ENCOUNTER — Other Ambulatory Visit: Payer: Self-pay | Admitting: Family Medicine

## 2015-03-01 ENCOUNTER — Ambulatory Visit (INDEPENDENT_AMBULATORY_CARE_PROVIDER_SITE_OTHER): Payer: Self-pay | Admitting: Family Medicine

## 2015-03-01 ENCOUNTER — Encounter: Payer: Self-pay | Admitting: Family Medicine

## 2015-03-01 VITALS — BP 124/68 | HR 103 | Temp 98.8°F | Resp 18 | Ht 69.0 in | Wt >= 6400 oz

## 2015-03-01 DIAGNOSIS — L03115 Cellulitis of right lower limb: Secondary | ICD-10-CM

## 2015-03-01 DIAGNOSIS — E1165 Type 2 diabetes mellitus with hyperglycemia: Secondary | ICD-10-CM

## 2015-03-01 DIAGNOSIS — R609 Edema, unspecified: Secondary | ICD-10-CM

## 2015-03-01 DIAGNOSIS — IMO0002 Reserved for concepts with insufficient information to code with codable children: Secondary | ICD-10-CM

## 2015-03-01 MED ORDER — CLINDAMYCIN HCL 300 MG PO CAPS: 600.0000 mg | ORAL_CAPSULE | Freq: Three times a day (TID) | ORAL | Status: DC

## 2015-03-01 NOTE — Progress Notes (Signed)
Name: Michael Martinez   MRN: [id number]    DOB: [birth year]   Date:03/01/2015       Progress Note  Subjective  Chief Complaint  Chief Complaint  Patient presents with  . Recurrent Skin Infections    right leg for 4 days  . OTHER    letter for jury duty    HPI  Cellulitis of the right lower extremity  The patient has a recurrent history of cellulitis particularly the right lower extremity. He is morbidly obese and has chronic venous stasis. In addition he probably also has primary pulmonary hypertension. He is a diabetic who is usually noncompliant to control and makes his visits very infrequently. He frequently returns to the ER for urgent care but has not followed chronically for his baseline diabetes were controlled. His current symptoms been present for 4 days. He has experienced chills but no documented fever. He already is on Cleocin 600 mg for several days From obesity. The patient has been counseled overtimes about his obesity. He continues to follow a diabetic diet and exercise his weight today of 416 his recent additional 2 pounds since last office visit.  Past Medical History  Diagnosis Date  . DVT (deep venous thrombosis) 2006    "after ankle surgery"  . Gout   . Obesity   . Hypertension   . Hypercholesteremia   . CHF (congestive heart failure)   . OSA on CPAP   . Peripheral edema     chronic  . Clotting disorder     deep vein thrombosis left leg  . Melanoma     left ear  . Shortness of breath   . Cellulitis and abscess of lower extremity 03/24/2014    RT LEG  . Diabetes mellitus     TYPE 2  . GERD (gastroesophageal reflux disease)   . Arthritis     RA    Social History  Substance Use Topics  . Smoking status: Never Smoker   . Smokeless tobacco: Never Used  . Alcohol Use: No     Current outpatient prescriptions:  .  acetaminophen (TYLENOL) 500 MG tablet, Take 1,000 mg by mouth every 6 (six) hours as needed for mild pain., Disp: , Rfl:  .  diazepam  (VALIUM) 5 MG tablet, Take 5 mg by mouth every 8 (eight) hours as needed. For muscle spasm, Disp: , Rfl:  .  glimepiride (AMARYL) 4 MG tablet, TAKE ONE TABLET BY MOUTH EVERY DAY, Disp: 30 tablet, Rfl: 0 .  HYDROcodone-acetaminophen (NORCO/VICODIN) 5-325 MG per tablet, Take 1 tablet by mouth every 8 (eight) hours as needed for pain., Disp: , Rfl:  .  ibuprofen (ADVIL,MOTRIN) 200 MG tablet, Take 400 mg by mouth every 6 (six) hours as needed for moderate pain., Disp: , Rfl:  .  indomethacin (INDOCIN) 25 MG capsule, Take 25 mg by mouth daily as needed (gout). For 3 days, Disp: , Rfl:  .  losartan (COZAAR) 100 MG tablet, TAKE ONE TABLET BY MOUTH EVERY DAY, Disp: 30 tablet, Rfl: 0 .  lovastatin (MEVACOR) 40 MG tablet, Take 40 mg by mouth at bedtime., Disp: , Rfl:  .  metFORMIN (GLUCOPHAGE) 1000 MG tablet, TAKE ONE TABLET BY MOUTH TWICE DAILY, Disp: 60 tablet, Rfl: 0 .  naproxen (NAPROSYN) 500 MG tablet, Take 1 tablet (500 mg total) by mouth 2 (two) times daily as needed for mild pain., Disp: , Rfl:  .  torsemide (DEMADEX) 20 MG tablet, Take 1-2 tablets daily., Disp: , Rfl:  .  valACYclovir (VALTREX) 500 MG tablet, Take 1 tablet (500 mg total) by mouth 2 (two) times daily as needed (for fever blisters). For 5 days, Disp: 20 tablet, Rfl: 0 .  vardenafil (LEVITRA) 20 MG tablet, Take 1 tablet (20 mg total) by mouth daily as needed for erectile dysfunction., Disp: 30 tablet, Rfl: 4  Allergies  Allergen Reactions  . Penicillins     Pt complained of chest tightness, pt very diaphoretic, pt chest, arms red, difficulty breathing.  Marland Kitchen Zosyn [Piperacillin Sod-Tazobactam So]     ROS   Objective  Filed Vitals:   03/01/15 1429  BP: 124/68  Pulse: 103  Temp: 98.8 F (37.1 C)  TempSrc: Oral  Resp: 18  Height: 5\' 9"  (1.753 m)  Weight: 416 lb 6.4 oz (188.878 kg)  SpO2: 98%     Physical Exam    Assessment & Plan  1. Cellulitis of right anterior lower leg  - clindamycin (CLEOCIN) 300 MG capsule;  Take 2 capsules (600 mg total) by mouth 3 (three) times daily.  Dispense: 52 capsule; Refill: 0  2. Cellulitis of right le  3. Diabetes type 2, uncontrolled

## 2015-03-05 ENCOUNTER — Telehealth: Payer: Self-pay | Admitting: Family Medicine

## 2015-03-05 MED ORDER — VALACYCLOVIR HCL 500 MG PO TABS: 500.0000 mg | ORAL_TABLET | Freq: Two times a day (BID) | ORAL | Status: DC | PRN

## 2015-03-05 NOTE — Telephone Encounter (Signed)
Script sent to [facility]

## 2015-03-05 NOTE — Telephone Encounter (Signed)
Pt is requesting a refill for Valtrex to be sent to [facility] on [address].  Please contact patient once it is done.

## 2015-03-08 ENCOUNTER — Other Ambulatory Visit: Payer: Self-pay

## 2015-03-08 MED ORDER — VALACYCLOVIR HCL 500 MG PO TABS: 500.0000 mg | ORAL_TABLET | Freq: Two times a day (BID) | ORAL | Status: DC | PRN

## 2015-03-09 ENCOUNTER — Telehealth: Payer: Self-pay | Admitting: Family Medicine

## 2015-03-09 MED ORDER — VALACYCLOVIR HCL 500 MG PO TABS: 500.0000 mg | ORAL_TABLET | Freq: Two times a day (BID) | ORAL | Status: DC | PRN

## 2015-03-09 NOTE — Telephone Encounter (Signed)
Spoke with Michael Martinez from [place] and sent rx.

## 2015-03-09 NOTE — Telephone Encounter (Signed)
Gerald Stabs from [facility] is asking that you refax or call in the prescription for Valtrax.  [place] ) [phone] (f) [phone]

## 2015-03-09 NOTE — Telephone Encounter (Signed)
Pt states he just called the pharmacy and they told him that the RX for Valtrex is not at the pharmacy.

## 2015-03-15 ENCOUNTER — Ambulatory Visit: Payer: Self-pay | Admitting: Family Medicine

## 2015-03-24 ENCOUNTER — Other Ambulatory Visit: Payer: Self-pay | Admitting: Family Medicine

## 2015-04-16 ENCOUNTER — Telehealth: Payer: Self-pay | Admitting: Family Medicine

## 2015-04-16 NOTE — Telephone Encounter (Signed)
Patient pharmacy [facility]) is closing on Monday 04-19-15, he is requesting a refill on all his medications. He would like a hard copy to take to a pharmacy of his choosing.

## 2015-04-20 NOTE — Telephone Encounter (Signed)
Per Dr. Rutherford Nail patient must be seen for additional refills

## 2015-04-22 ENCOUNTER — Telehealth: Payer: Self-pay | Admitting: Family Medicine

## 2015-04-22 MED ORDER — METFORMIN HCL 1000 MG PO TABS: 1000.0000 mg | ORAL_TABLET | Freq: Two times a day (BID) | ORAL | Status: DC

## 2015-04-22 MED ORDER — LOSARTAN POTASSIUM 100 MG PO TABS: 100.0000 mg | ORAL_TABLET | Freq: Every day | ORAL | Status: DC

## 2015-04-22 MED ORDER — GLIMEPIRIDE 4 MG PO TABS: 4.0000 mg | ORAL_TABLET | Freq: Every day | ORAL | Status: DC

## 2015-04-22 MED ORDER — NAPROXEN 500 MG PO TABS: 500.0000 mg | ORAL_TABLET | Freq: Two times a day (BID) | ORAL | Status: DC

## 2015-04-22 NOTE — Telephone Encounter (Signed)
Left voice message to inform patient that prescriptions have been called in

## 2015-04-22 NOTE — Telephone Encounter (Signed)
Sent refills to [facility] on [address].

## 2015-04-22 NOTE — Telephone Encounter (Signed)
Patient was informed that no refills would be called in until he was seen, however patient says that his glimepiride, metformin, naproxen, and losartan will not carry him through next week. He does have appointment for 05-04-15. Please send to [facility] [address] because [facility] is now closed

## 2015-05-04 ENCOUNTER — Encounter: Payer: Self-pay | Admitting: Family Medicine

## 2015-05-04 ENCOUNTER — Ambulatory Visit (INDEPENDENT_AMBULATORY_CARE_PROVIDER_SITE_OTHER): Payer: Self-pay | Admitting: Family Medicine

## 2015-05-04 ENCOUNTER — Ambulatory Visit: Payer: Self-pay | Admitting: Family Medicine

## 2015-05-04 VITALS — BP 122/76 | HR 103 | Temp 99.1°F | Resp 18 | Ht 69.0 in | Wt >= 6400 oz

## 2015-05-04 DIAGNOSIS — Z23 Encounter for immunization: Secondary | ICD-10-CM

## 2015-05-04 DIAGNOSIS — Z9989 Dependence on other enabling machines and devices: Secondary | ICD-10-CM

## 2015-05-04 DIAGNOSIS — L03115 Cellulitis of right lower limb: Secondary | ICD-10-CM

## 2015-05-04 DIAGNOSIS — G4733 Obstructive sleep apnea (adult) (pediatric): Secondary | ICD-10-CM

## 2015-05-04 DIAGNOSIS — E785 Hyperlipidemia, unspecified: Secondary | ICD-10-CM

## 2015-05-04 DIAGNOSIS — E1169 Type 2 diabetes mellitus with other specified complication: Secondary | ICD-10-CM

## 2015-05-04 DIAGNOSIS — I5032 Chronic diastolic (congestive) heart failure: Secondary | ICD-10-CM

## 2015-05-04 LAB — POCT GLYCOSYLATED HEMOGLOBIN (HGB A1C): HEMOGLOBIN A1C: 7.9

## 2015-05-04 LAB — GLUCOSE, POCT (MANUAL RESULT ENTRY): POC Glucose: 209 mg/dl — AB (ref 70–99)

## 2015-05-04 MED ORDER — LOVASTATIN 40 MG PO TABS: 40.0000 mg | ORAL_TABLET | Freq: Every day | ORAL | Status: DC

## 2015-05-04 MED ORDER — VARDENAFIL HCL 20 MG PO TABS: 20.0000 mg | ORAL_TABLET | Freq: Every day | ORAL | Status: DC | PRN

## 2015-05-04 MED ORDER — INDOMETHACIN 25 MG PO CAPS: 25.0000 mg | ORAL_CAPSULE | Freq: Every day | ORAL | Status: DC | PRN

## 2015-05-04 MED ORDER — VALACYCLOVIR HCL 500 MG PO TABS: 500.0000 mg | ORAL_TABLET | Freq: Two times a day (BID) | ORAL | Status: DC | PRN

## 2015-05-04 MED ORDER — HYDROCODONE-ACETAMINOPHEN 5-325 MG PO TABS: 1.0000 | ORAL_TABLET | Freq: Three times a day (TID) | ORAL | Status: DC | PRN

## 2015-05-04 MED ORDER — DIAZEPAM 5 MG PO TABS: 5.0000 mg | ORAL_TABLET | Freq: Three times a day (TID) | ORAL | Status: DC | PRN

## 2015-05-04 MED ORDER — METFORMIN HCL 1000 MG PO TABS: 1000.0000 mg | ORAL_TABLET | Freq: Two times a day (BID) | ORAL | Status: DC

## 2015-05-04 MED ORDER — CLINDAMYCIN HCL 300 MG PO CAPS: 600.0000 mg | ORAL_CAPSULE | Freq: Three times a day (TID) | ORAL | Status: DC

## 2015-05-04 MED ORDER — TORSEMIDE 20 MG PO TABS: 20.0000 mg | ORAL_TABLET | Freq: Every day | ORAL | Status: DC

## 2015-05-04 MED ORDER — LOSARTAN POTASSIUM 100 MG PO TABS: 100.0000 mg | ORAL_TABLET | Freq: Every day | ORAL | Status: DC

## 2015-05-04 MED ORDER — NAPROXEN 500 MG PO TABS: 500.0000 mg | ORAL_TABLET | Freq: Two times a day (BID) | ORAL | Status: DC

## 2015-05-04 MED ORDER — GLIMEPIRIDE 4 MG PO TABS: 4.0000 mg | ORAL_TABLET | Freq: Every day | ORAL | Status: DC

## 2015-05-04 NOTE — Progress Notes (Signed)
Name: Michael Martinez   MRN: [id number]    DOB: 1956-05-08   Date:05/04/2015       Progress Note  Subjective  Chief Complaint  Chief Complaint  Patient presents with  . Diabetes  . Obesity    HPI  Diabetes  Patient presents for follow-up of diabetes which is present for over 5 years. Is currently on a regimen of metformin 1 g twice a day and glimepiride 4 mg twice a day losartan 100 mg daily . Patient states noncompliant with their diet and exercise. There's been no hypoglycemic episodes and there is no polyuria polydipsia polyphagia. His average fasting glucoses been in the low around low 100s with a high around upper 100s to low 200s . There is no end organ disease.  Last diabetic eye exam was over year ago.   Last visit with dietitian was last hospitalization earlier this year. Last microalbumin was obtained May 2009 and was 20 .   Hypertension   Patient presents for follow-up of hypertension. It has been present for over 5 years.  Patient states that there is compliance with medical regimen which consists of losartan 100 mg daily . There is no end organ disease. Cardiac risk factors include hypertension hyperlipidemia and diabetes.  Exercise regimen consist of minimal walking .  Diet consist of inappropriate foods choices .  Hyperlipidemia  Patient has a history of hyperlipidemia for over 5 years.  Current medical regimen consist of  To in lovastatin 40 mg daily at bedtime 6 months lovastatin 40 mg by mouth daily at bedtimecrease active .  Compliance isgood  .  Diet and exercise are currently followedrarely Risk factors for cardiovascular disease include hyperlipidemia , hypertension, obesity, diabetes   There have been no side effects from the medication.    Obesity  Patient has a history of obesity forMultiplers.  Attempts at weight loss have included diet and exercisDiet.  Results of this regimen  haveAnd affect  Patient now voices and interest in weight losBariatric  surgerysteoarthritis  Patient has diffuse joint pain mainly in the knees and hands. Has been on Naprosyn and Vicodin when necessary basis in addition.  Past Medical History  Diagnosis Date  . DVT (deep venous thrombosis) ([place]) 2006    "after ankle surgery"  . Gout   . Obesity   . Hypertension   . Hypercholesteremia   . CHF (congestive heart failure) ([place])   . OSA on CPAP   . Peripheral edema     chronic  . Clotting disorder (HCC)     deep vein thrombosis left leg  . Melanoma ([place])     left ear  . Shortness of breath   . Cellulitis and abscess of lower extremity 03/24/2014    RT LEG  . Diabetes mellitus     TYPE 2  . GERD (gastroesophageal reflux disease)   . Arthritis     RA    Social History  Substance Use Topics  . Smoking status: Never Smoker   . Smokeless tobacco: Never Used  . Alcohol Use: No     Current outpatient prescriptions:  .  acetaminophen (TYLENOL) 500 MG tablet, Take 1,000 mg by mouth every 6 (six) hours as needed for mild pain., Disp: , Rfl:  .  clindamycin (CLEOCIN) 300 MG capsule, Take 2 capsules (600 mg total) by mouth 3 (three) times daily., Disp: 52 capsule, Rfl: 0 .  diazepam (VALIUM) 5 MG tablet, Take 5 mg by mouth every 8 (eight) hours as needed.  For muscle spasm, Disp: , Rfl:  .  glimepiride (AMARYL) 4 MG tablet, Take 1 tablet (4 mg total) by mouth daily., Disp: 30 tablet, Rfl: 2 .  HYDROcodone-acetaminophen (NORCO/VICODIN) 5-325 MG per tablet, Take 1 tablet by mouth every 8 (eight) hours as needed for pain., Disp: , Rfl:  .  ibuprofen (ADVIL,MOTRIN) 200 MG tablet, Take 400 mg by mouth every 6 (six) hours as needed for moderate pain., Disp: , Rfl:  .  indomethacin (INDOCIN) 25 MG capsule, Take 25 mg by mouth daily as needed (gout). For 3 days, Disp: , Rfl:  .  losartan (COZAAR) 100 MG tablet, Take 1 tablet (100 mg total) by mouth daily., Disp: 30 tablet, Rfl: 2 .  lovastatin (MEVACOR) 40 MG tablet, Take 40 mg by mouth at bedtime., Disp: , Rfl:   .  metFORMIN (GLUCOPHAGE) 1000 MG tablet, Take 1 tablet (1,000 mg total) by mouth 2 (two) times daily., Disp: 60 tablet, Rfl: 2 .  naproxen (NAPROSYN) 500 MG tablet, Take 1 tablet (500 mg total) by mouth 2 (two) times daily., Disp: 60 tablet, Rfl: 0 .  torsemide (DEMADEX) 20 MG tablet, Take 1-2 tablets daily., Disp: , Rfl:  .  valACYclovir (VALTREX) 500 MG tablet, Take 1 tablet (500 mg total) by mouth 2 (two) times daily as needed (for fever blisters). For 5 days, Disp: 20 tablet, Rfl: 3 .  vardenafil (LEVITRA) 20 MG tablet, Take 1 tablet (20 mg total) by mouth daily as needed for erectile dysfunction., Disp: 30 tablet, Rfl: 4  Allergies  Allergen Reactions  . Penicillins     Pt complained of chest tightness, pt very diaphoretic, pt chest, arms red, difficulty breathing.  Marland Kitchen Zosyn [Piperacillin Sod-Tazobactam So]     Review of Systems  Constitutional: Negative for fever, chills and weight loss.  HENT: Negative for congestion, hearing loss, sore throat and tinnitus.   Eyes: Negative for blurred vision, double vision and redness.  Respiratory: Negative for cough, hemoptysis and shortness of breath.   Cardiovascular: Positive for leg swelling. Negative for chest pain, palpitations, orthopnea and claudication.  Gastrointestinal: Negative for heartburn, nausea, vomiting, diarrhea, constipation and blood in stool.  Genitourinary: Negative for dysuria, urgency, frequency and hematuria.  Musculoskeletal: Positive for back pain and joint pain. Negative for myalgias, falls and neck pain.  Skin: Positive for rash. Negative for itching.  Neurological: Negative for dizziness, tingling, tremors, focal weakness, seizures, loss of consciousness, weakness and headaches.  Endo/Heme/Allergies: Does not bruise/bleed easily.  Psychiatric/Behavioral: Negative for depression and substance abuse. The patient is not nervous/anxious and does not have insomnia.      Objective  Filed Vitals:   05/04/15 1459   BP: 122/76  Pulse: 103  Temp: 99.1 F (37.3 C)  Resp: 18  Height: 5\' 9"  (1.753 m)  Weight: 418 lb 1 oz (189.632 kg)  SpO2: 98%     Physical Exam  Constitutional: He is oriented to person, place, and time.  Morbidly obese but in no acute distress  HENT:  Head: Normocephalic.  Eyes: EOM are normal. Pupils are equal, round, and reactive to light.  Neck: Normal range of motion. Neck supple. No thyromegaly present.  Cardiovascular: Normal rate, regular rhythm, normal heart sounds and intact distal pulses.   No murmur heard. Venous stasis changes and varicose veins are normal with 2+ edema  Pulmonary/Chest: Effort normal and breath sounds normal. No respiratory distress. He has no wheezes.  Abdominal: Bowel sounds are normal.  Musculoskeletal: Normal range of motion. He exhibits  edema.  Lymphadenopathy:    He has no cervical adenopathy.  Neurological: He is alert and oriented to person, place, and time. No cranial nerve deficit. Gait normal. Coordination normal.  Skin: Skin is warm and dry. No rash noted.  Psychiatric: Affect and judgment normal.      Assessment & Plan  1. Type 2 diabetes mellitus with other specified complication ([place]) Patient to be more compliant with diet and exercise stable - POCT Glucose (CBG) - POCT HgB A1C  2. Chronic diastolic CHF (congestive heart failure) (HCC) Stable  3. OSA on CPAP Continue to use CPAP  4. Hyperlipemia Labs on return visit patient will consider  5. Morbid obesity due to excess calories [facility]) Patient will consider gastric operative procedure  6. Cellulitis of right anterior lower leg Now resolved   7. Need for influenza vaccination Given - Flu Vaccine QUAD 36+ mos PF IM (Fluarix & Fluzone Quad PF)

## 2015-05-05 ENCOUNTER — Telehealth: Payer: Self-pay | Admitting: Family Medicine

## 2015-05-05 MED ORDER — MUPIROCIN 2 % EX OINT: 1.0000 "application " | TOPICAL_OINTMENT | Freq: Two times a day (BID) | CUTANEOUS | Status: DC

## 2015-05-05 NOTE — Telephone Encounter (Signed)
Script has been sent to pharmacy pt requested

## 2015-05-05 NOTE — Telephone Encounter (Signed)
Was seen on 05-04-15 and forgot to request a refill on Mupirocin Ointment. Please send to [facility].

## 2015-05-05 NOTE — Telephone Encounter (Signed)
Patient informed. 

## 2015-06-02 ENCOUNTER — Other Ambulatory Visit: Payer: Self-pay | Admitting: Family Medicine

## 2015-07-08 ENCOUNTER — Other Ambulatory Visit: Payer: Self-pay | Admitting: Family Medicine

## 2015-07-15 ENCOUNTER — Other Ambulatory Visit: Payer: Self-pay | Admitting: Family Medicine

## 2015-07-18 DIAGNOSIS — I2699 Other pulmonary embolism without acute cor pulmonale: Secondary | ICD-10-CM

## 2015-07-18 HISTORY — DX: Other pulmonary embolism without acute cor pulmonale: I26.99

## 2015-08-26 ENCOUNTER — Ambulatory Visit
Admission: RE | Admit: 2015-08-26 | Discharge: 2015-08-26 | Disposition: A | Discharge status: Home / Self Care | Payer: Self-pay | Source: Ambulatory Visit | Attending: Family Medicine | Admitting: Family Medicine

## 2015-08-26 ENCOUNTER — Ambulatory Visit (INDEPENDENT_AMBULATORY_CARE_PROVIDER_SITE_OTHER): Payer: Self-pay | Admitting: Family Medicine

## 2015-08-26 ENCOUNTER — Encounter: Payer: Self-pay | Admitting: Family Medicine

## 2015-08-26 VITALS — BP 122/68 | HR 114 | Temp 98.5°F | Resp 18 | Ht 69.0 in | Wt >= 6400 oz

## 2015-08-26 DIAGNOSIS — J209 Acute bronchitis, unspecified: Secondary | ICD-10-CM | POA: Insufficient documentation

## 2015-08-26 DIAGNOSIS — R05 Cough: Secondary | ICD-10-CM | POA: Insufficient documentation

## 2015-08-26 MED ORDER — AZITHROMYCIN 250 MG PO TABS: ORAL_TABLET | ORAL | Status: DC

## 2015-08-26 MED ORDER — GUAIFENESIN-CODEINE 100-10 MG/5ML PO SYRP: 10.0000 mL | ORAL_SOLUTION | Freq: Four times a day (QID) | ORAL | Status: DC | PRN

## 2015-08-26 MED ORDER — PREDNISONE 10 MG PO TABS: 10.0000 mg | ORAL_TABLET | Freq: Every day | ORAL | Status: DC

## 2015-08-26 MED ORDER — ALBUTEROL SULFATE HFA 108 (90 BASE) MCG/ACT IN AERS: 2.0000 | INHALATION_SPRAY | Freq: Four times a day (QID) | RESPIRATORY_TRACT | Status: DC | PRN

## 2015-08-26 NOTE — Progress Notes (Signed)
Name: Michael Martinez   MRN: [id number]    DOB: Jun 21, 1956   Date:08/26/2015       Progress Note  Subjective  Chief Complaint  Chief Complaint  Patient presents with  . URI    onset 3 days, worsening.  Patients syptoms include: productive cough, congestion, sob, wheezing.  He otc mucinex and has used daughters nebulizer    Cough This is a new problem. Episode onset: 6. The cough is productive of sputum. Associated symptoms include chest pain (tightness in chest), shortness of breath and wheezing. Pertinent negatives include no chills, fever, headaches or sore throat. He has tried a beta-agonist inhaler (Mucinex DM, used his duaghter's Albuterol with his Nebulizer, which helped relieve his symptoms to some extent) for the symptoms.     Past Medical History  Diagnosis Date  . DVT (deep venous thrombosis) ([place]) 2006    "after ankle surgery"  . Gout   . Obesity   . Hypertension   . Hypercholesteremia   . CHF (congestive heart failure) ([place])   . OSA on CPAP   . Peripheral edema     chronic  . Clotting disorder (HCC)     deep vein thrombosis left leg  . Melanoma ([place])     left ear  . Shortness of breath   . Cellulitis and abscess of lower extremity 03/24/2014    RT LEG  . Diabetes mellitus     TYPE 2  . GERD (gastroesophageal reflux disease)   . Arthritis     RA    Past Surgical History  Procedure Laterality Date  . Hernia repair    . Tonsillectomy    . Skin cancer excision      Family History  Problem Relation Age of Onset  . Stroke Mother   . Hypertension Mother   . Asthma Mother   . Hypertension Father   . Asthma Sister   . Asthma Sister   . Asthma Daughter   . Tuberculosis Mother     Social History   Social History  . Marital Status: Married    Spouse Name: N/A  . Number of Children: N/A  . Years of Education: N/A   Occupational History  . Not on file.   Social History Main Topics  . Smoking status: Never Smoker   . Smokeless tobacco: Never Used   . Alcohol Use: No  . Drug Use: No  . Sexual Activity: Not on file   Other Topics Concern  . Not on file   Social History Narrative     Current outpatient prescriptions:  .  acetaminophen (TYLENOL) 500 MG tablet, Take 1,000 mg by mouth every 6 (six) hours as needed for mild pain., Disp: , Rfl:  .  diazepam (VALIUM) 5 MG tablet, Take 1 tablet (5 mg total) by mouth every 8 (eight) hours as needed. For muscle spasm, Disp: 30 tablet, Rfl: 3 .  glimepiride (AMARYL) 4 MG tablet, Take 1 tablet (4 mg total) by mouth daily., Disp: 30 tablet, Rfl: 3 .  HYDROcodone-acetaminophen (NORCO/VICODIN) 5-325 MG tablet, Take 1 tablet by mouth every 8 (eight) hours as needed., Disp: 30 tablet, Rfl: 3 .  ibuprofen (ADVIL,MOTRIN) 200 MG tablet, Take 400 mg by mouth every 6 (six) hours as needed for moderate pain., Disp: , Rfl:  .  indomethacin (INDOCIN) 25 MG capsule, Take 1 capsule (25 mg total) by mouth daily as needed (gout). For 3 days, Disp: 30 capsule, Rfl: 3 .  losartan (COZAAR) 100 MG tablet,  Take 1 tablet (100 mg total) by mouth daily., Disp: 30 tablet, Rfl: 2 .  lovastatin (MEVACOR) 40 MG tablet, Take 1 tablet (40 mg total) by mouth at bedtime., Disp: 30 tablet, Rfl: 3 .  metFORMIN (GLUCOPHAGE) 1000 MG tablet, Take 1 tablet (1,000 mg total) by mouth 2 (two) times daily., Disp: 60 tablet, Rfl: 2 .  mupirocin ointment (BACTROBAN) 2 %, Apply 1 application topically 2 (two) times daily., Disp: 22 g, Rfl: 0 .  naproxen (NAPROSYN) 500 MG tablet, Take 1 tablet (500 mg total) by mouth 2 (two) times daily., Disp: 60 tablet, Rfl: 0 .  naproxen (NAPROSYN) 500 MG tablet, TAKE 1 TABLET(500 MG) BY MOUTH TWICE DAILY, Disp: 60 tablet, Rfl: 0 .  naproxen (NAPROSYN) 500 MG tablet, TAKE 1 TABLET(500 MG) BY MOUTH TWICE DAILY, Disp: 60 tablet, Rfl: 0 .  torsemide (DEMADEX) 20 MG tablet, TAKE 1 TABLET BY MOUTH ONCE TO TWICE DAILY, Disp: 60 tablet, Rfl: 0 .  valACYclovir (VALTREX) 500 MG tablet, Take 1 tablet (500 mg total)  by mouth 2 (two) times daily as needed (for fever blisters). For 5 days, Disp: 20 tablet, Rfl: 3 .  vardenafil (LEVITRA) 20 MG tablet, Take 1 tablet (20 mg total) by mouth daily as needed for erectile dysfunction., Disp: 30 tablet, Rfl: 4  Allergies  Allergen Reactions  . Penicillins     Pt complained of chest tightness, pt very diaphoretic, pt chest, arms red, difficulty breathing.  Marland Kitchen Zosyn [Piperacillin Sod-Tazobactam So]      Review of Systems  Constitutional: Negative for fever and chills.  HENT: Negative for sore throat.   Respiratory: Positive for cough, shortness of breath and wheezing.   Cardiovascular: Positive for chest pain (tightness in chest).  Neurological: Negative for headaches.     Objective  Filed Vitals:   08/26/15 1102  BP: 122/68  Pulse: 114  Temp: 98.5 F (36.9 C)  TempSrc: Oral  Resp: 18  Height: 5\' 9"  (1.753 m)  Weight: 415 lb (188.243 kg)  SpO2: 94%    Physical Exam  Constitutional: He is well-developed, well-nourished, and in no distress.  HENT:  Mouth/Throat: Posterior oropharyngeal erythema present.  Cardiovascular: Tachycardia present.   Pulmonary/Chest: He has wheezes.  Soft expiratory wheezes.  Nursing note and vitals reviewed.      Assessment & Plan  1. Acute bronchitis with bronchospasm Likely viral bronchitis, we'll obtain chest x-ray to rule out pneumonia. Started on Z-Pak, albuterol, prednisone, and antitussive. Follow-up with PCP. - predniSONE (DELTASONE) 10 MG tablet; Take 1 tablet (10 mg total) by mouth daily with breakfast. 50 40 30 20 10  then STOP  Dispense: 15 tablet; Refill: 0 - azithromycin (ZITHROMAX) 250 MG tablet; 2 tabs po x day 1, then 1 tab po q day x 4 days  Dispense: 6 each; Refill: 0 - albuterol (PROVENTIL HFA) 108 (90 Base) MCG/ACT inhaler; Inhale 2 puffs into the lungs every 6 (six) hours as needed for wheezing or shortness of breath.  Dispense: 1 Inhaler; Refill: 0 - DG Chest 2 View; Future -  guaiFENesin-codeine (CHERATUSSIN AC) 100-10 MG/5ML syrup; Take 10 mLs by mouth 4 (four) times daily as needed for cough.  Dispense: 200 mL; Refill: 0   Michael Martinez [facility] 08/26/2015 12:03 PM

## 2015-08-27 ENCOUNTER — Telehealth: Payer: Self-pay | Admitting: Family Medicine

## 2015-08-27 NOTE — Telephone Encounter (Signed)
Patient's wife called wanting to know get the results of patient x-ray that was taken on yesterday, 08/26/15.

## 2015-08-27 NOTE — Telephone Encounter (Signed)
Called and spoke to [place] concerning his results.

## 2015-08-28 ENCOUNTER — Emergency Department (HOSPITAL_COMMUNITY): Payer: Self-pay

## 2015-08-28 ENCOUNTER — Encounter (HOSPITAL_COMMUNITY): Payer: Self-pay | Admitting: Emergency Medicine

## 2015-08-28 ENCOUNTER — Observation Stay (HOSPITAL_COMMUNITY)
Admission: EM | Admit: 2015-08-28 | Discharge: 2015-08-31 | Disposition: A | Discharge status: Home / Self Care | Payer: Self-pay | Attending: Internal Medicine | Admitting: Internal Medicine

## 2015-08-28 DIAGNOSIS — E785 Hyperlipidemia, unspecified: Secondary | ICD-10-CM

## 2015-08-28 DIAGNOSIS — E119 Type 2 diabetes mellitus without complications: Secondary | ICD-10-CM

## 2015-08-28 DIAGNOSIS — K219 Gastro-esophageal reflux disease without esophagitis: Secondary | ICD-10-CM | POA: Insufficient documentation

## 2015-08-28 DIAGNOSIS — J111 Influenza due to unidentified influenza virus with other respiratory manifestations: Secondary | ICD-10-CM

## 2015-08-28 DIAGNOSIS — Z9989 Dependence on other enabling machines and devices: Secondary | ICD-10-CM

## 2015-08-28 DIAGNOSIS — N179 Acute kidney failure, unspecified: Secondary | ICD-10-CM

## 2015-08-28 DIAGNOSIS — G4733 Obstructive sleep apnea (adult) (pediatric): Secondary | ICD-10-CM

## 2015-08-28 DIAGNOSIS — I5032 Chronic diastolic (congestive) heart failure: Secondary | ICD-10-CM

## 2015-08-28 DIAGNOSIS — R0602 Shortness of breath: Secondary | ICD-10-CM | POA: Diagnosis present

## 2015-08-28 DIAGNOSIS — Z85820 Personal history of malignant melanoma of skin: Secondary | ICD-10-CM | POA: Insufficient documentation

## 2015-08-28 DIAGNOSIS — Z79899 Other long term (current) drug therapy: Secondary | ICD-10-CM | POA: Insufficient documentation

## 2015-08-28 DIAGNOSIS — I110 Hypertensive heart disease with heart failure: Secondary | ICD-10-CM

## 2015-08-28 DIAGNOSIS — E7800 Pure hypercholesterolemia, unspecified: Secondary | ICD-10-CM | POA: Insufficient documentation

## 2015-08-28 DIAGNOSIS — Z9981 Dependence on supplemental oxygen: Secondary | ICD-10-CM | POA: Insufficient documentation

## 2015-08-28 DIAGNOSIS — I509 Heart failure, unspecified: Secondary | ICD-10-CM | POA: Insufficient documentation

## 2015-08-28 DIAGNOSIS — Z7984 Long term (current) use of oral hypoglycemic drugs: Secondary | ICD-10-CM

## 2015-08-28 DIAGNOSIS — Z86718 Personal history of other venous thrombosis and embolism: Secondary | ICD-10-CM | POA: Insufficient documentation

## 2015-08-28 DIAGNOSIS — I10 Essential (primary) hypertension: Secondary | ICD-10-CM | POA: Insufficient documentation

## 2015-08-28 DIAGNOSIS — Z880 Allergy status to penicillin: Secondary | ICD-10-CM | POA: Insufficient documentation

## 2015-08-28 DIAGNOSIS — R0600 Dyspnea, unspecified: Secondary | ICD-10-CM

## 2015-08-28 DIAGNOSIS — M109 Gout, unspecified: Secondary | ICD-10-CM | POA: Insufficient documentation

## 2015-08-28 DIAGNOSIS — M1990 Unspecified osteoarthritis, unspecified site: Secondary | ICD-10-CM | POA: Insufficient documentation

## 2015-08-28 DIAGNOSIS — R069 Unspecified abnormalities of breathing: Principal | ICD-10-CM | POA: Insufficient documentation

## 2015-08-28 HISTORY — DX: Bronchitis, not specified as acute or chronic: J40

## 2015-08-28 LAB — CBC WITH DIFFERENTIAL/PLATELET
BASOS ABS: 0 10*3/uL (ref 0.0–0.1)
BASOS PCT: 0 %
Eosinophils Absolute: 0 10*3/uL (ref 0.0–0.7)
Eosinophils Relative: 0 %
HEMATOCRIT: 40.3 % (ref 39.0–52.0)
HEMOGLOBIN: 13 g/dL (ref 13.0–17.0)
Lymphocytes Relative: 17 %
Lymphs Abs: 1.5 10*3/uL (ref 0.7–4.0)
MCH: 27.2 pg (ref 26.0–34.0)
MCHC: 32.3 g/dL (ref 30.0–36.0)
MCV: 84.3 fL (ref 78.0–100.0)
Monocytes Absolute: 1.2 10*3/uL — ABNORMAL HIGH (ref 0.1–1.0)
Monocytes Relative: 13 %
NEUTROS ABS: 6.4 10*3/uL (ref 1.7–7.7)
NEUTROS PCT: 70 %
Platelets: 195 10*3/uL (ref 150–400)
RBC: 4.78 MIL/uL (ref 4.22–5.81)
RDW: 14.3 % (ref 11.5–15.5)
WBC: 9.2 10*3/uL (ref 4.0–10.5)

## 2015-08-28 LAB — BASIC METABOLIC PANEL
ANION GAP: 10 (ref 5–15)
BUN: 20 mg/dL (ref 6–20)
CALCIUM: 8.7 mg/dL — AB (ref 8.9–10.3)
CHLORIDE: 103 mmol/L (ref 101–111)
CO2: 23 mmol/L (ref 22–32)
Creatinine, Ser: 1.41 mg/dL — ABNORMAL HIGH (ref 0.61–1.24)
GFR calc non Af Amer: 52 mL/min — ABNORMAL LOW (ref >60)
Glucose, Bld: 139 mg/dL — ABNORMAL HIGH (ref 65–99)
Potassium: 3.9 mmol/L (ref 3.5–5.1)
Sodium: 136 mmol/L (ref 135–145)

## 2015-08-28 LAB — I-STAT CG4 LACTIC ACID, ED: Lactic Acid, Venous: 1.76 mmol/L (ref 0.5–2.0)

## 2015-08-28 LAB — GLUCOSE, CAPILLARY
Glucose-Capillary: 299 mg/dL — ABNORMAL HIGH (ref 65–99)
Glucose-Capillary: 377 mg/dL — ABNORMAL HIGH (ref 65–99)
Glucose-Capillary: 238 mg/dL — ABNORMAL HIGH (ref 65–99)

## 2015-08-28 LAB — BRAIN NATRIURETIC PEPTIDE: B Natriuretic Peptide: 121.2 pg/mL — ABNORMAL HIGH (ref 0.0–100.0)

## 2015-08-28 LAB — I-STAT TROPONIN, ED: TROPONIN I, POC: 0.01 ng/mL (ref 0.00–0.08)

## 2015-08-28 LAB — INFLUENZA PANEL BY PCR (TYPE A & B)
H1N1FLUPCR: NOT DETECTED
INFLBPCR: NEGATIVE
Influenza A By PCR: POSITIVE — AB

## 2015-08-28 MED ORDER — SODIUM CHLORIDE 0.9 % IV SOLN
INTRAVENOUS | Status: AC
  Administered 2015-08-28: 14:00:00 via INTRAVENOUS

## 2015-08-28 MED ORDER — IPRATROPIUM-ALBUTEROL 0.5-2.5 (3) MG/3ML IN SOLN
3.0000 mL | Freq: Four times a day (QID) | RESPIRATORY_TRACT | Status: DC
  Administered 2015-08-28 – 2015-08-29 (×4): 3 mL via RESPIRATORY_TRACT
  Filled 2015-08-28 (×5): qty 3

## 2015-08-28 MED ORDER — SODIUM CHLORIDE 0.9 % IV SOLN: INTRAVENOUS | Status: DC

## 2015-08-28 MED ORDER — ALBUTEROL SULFATE (2.5 MG/3ML) 0.083% IN NEBU: 2.5000 mg | INHALATION_SOLUTION | Freq: Four times a day (QID) | RESPIRATORY_TRACT | Status: DC

## 2015-08-28 MED ORDER — PRAVASTATIN SODIUM 40 MG PO TABS
40.0000 mg | ORAL_TABLET | Freq: Every day | ORAL | Status: DC
  Administered 2015-08-28 – 2015-08-30 (×3): 40 mg via ORAL
  Filled 2015-08-28 (×3): qty 1

## 2015-08-28 MED ORDER — METHYLPREDNISOLONE SODIUM SUCC 125 MG IJ SOLR
125.0000 mg | Freq: Once | INTRAMUSCULAR | Status: AC
  Administered 2015-08-28: 125 mg via INTRAVENOUS
  Filled 2015-08-28: qty 2

## 2015-08-28 MED ORDER — INSULIN ASPART 100 UNIT/ML ~~LOC~~ SOLN
0.0000 [IU] | Freq: Every day | SUBCUTANEOUS | Status: DC
  Administered 2015-08-28: 2 [IU] via SUBCUTANEOUS

## 2015-08-28 MED ORDER — ENOXAPARIN SODIUM 100 MG/ML ~~LOC~~ SOLN
90.0000 mg | SUBCUTANEOUS | Status: DC
  Administered 2015-08-28 – 2015-08-30 (×3): 90 mg via SUBCUTANEOUS
  Filled 2015-08-28 (×3): qty 1

## 2015-08-28 MED ORDER — IPRATROPIUM BROMIDE 0.02 % IN SOLN
0.5000 mg | RESPIRATORY_TRACT | Status: AC
  Administered 2015-08-28: 0.5 mg via RESPIRATORY_TRACT
  Filled 2015-08-28 (×2): qty 2.5

## 2015-08-28 MED ORDER — OSELTAMIVIR PHOSPHATE 75 MG PO CAPS
75.0000 mg | ORAL_CAPSULE | Freq: Two times a day (BID) | ORAL | Status: DC
  Administered 2015-08-28 – 2015-08-31 (×6): 75 mg via ORAL
  Filled 2015-08-28 (×8): qty 1

## 2015-08-28 MED ORDER — ZOLPIDEM TARTRATE 5 MG PO TABS
5.0000 mg | ORAL_TABLET | Freq: Every evening | ORAL | Status: DC | PRN
  Administered 2015-08-28 – 2015-08-30 (×3): 5 mg via ORAL
  Filled 2015-08-28 (×3): qty 1

## 2015-08-28 MED ORDER — ACETAMINOPHEN 650 MG RE SUPP
650.0000 mg | Freq: Four times a day (QID) | RECTAL | Status: DC | PRN
Start: 2015-08-28 — End: 2015-08-31

## 2015-08-28 MED ORDER — ACETAMINOPHEN 325 MG PO TABS
650.0000 mg | ORAL_TABLET | Freq: Four times a day (QID) | ORAL | Status: DC | PRN
Start: 2015-08-28 — End: 2015-08-31
  Administered 2015-08-28 – 2015-08-31 (×8): 650 mg via ORAL
  Filled 2015-08-28 (×9): qty 2

## 2015-08-28 MED ORDER — ALBUTEROL SULFATE (2.5 MG/3ML) 0.083% IN NEBU
3.0000 mL | INHALATION_SOLUTION | Freq: Four times a day (QID) | RESPIRATORY_TRACT | Status: DC | PRN
  Administered 2015-08-29 – 2015-08-30 (×3): 3 mL via RESPIRATORY_TRACT
  Filled 2015-08-28 (×3): qty 3

## 2015-08-28 MED ORDER — INSULIN ASPART 100 UNIT/ML ~~LOC~~ SOLN
0.0000 [IU] | Freq: Three times a day (TID) | SUBCUTANEOUS | Status: DC
  Administered 2015-08-28: 15 [IU] via SUBCUTANEOUS
  Administered 2015-08-29 (×2): 5 [IU] via SUBCUTANEOUS
  Administered 2015-08-29: 2 [IU] via SUBCUTANEOUS

## 2015-08-28 MED ORDER — SENNOSIDES-DOCUSATE SODIUM 8.6-50 MG PO TABS: 1.0000 | ORAL_TABLET | Freq: Every evening | ORAL | Status: DC | PRN

## 2015-08-28 MED ORDER — ALBUTEROL (5 MG/ML) CONTINUOUS INHALATION SOLN
15.0000 mg/h | INHALATION_SOLUTION | RESPIRATORY_TRACT | Status: AC
  Administered 2015-08-28: 15 mg/h via RESPIRATORY_TRACT
  Filled 2015-08-28 (×2): qty 20

## 2015-08-28 MED ORDER — GUAIFENESIN-DM 100-10 MG/5ML PO SYRP
5.0000 mL | ORAL_SOLUTION | ORAL | Status: DC | PRN
  Administered 2015-08-29 – 2015-08-31 (×8): 5 mL via ORAL
  Filled 2015-08-28 (×8): qty 5

## 2015-08-28 MED ORDER — SODIUM CHLORIDE 0.9% FLUSH
3.0000 mL | Freq: Two times a day (BID) | INTRAVENOUS | Status: DC
  Administered 2015-08-28 – 2015-08-31 (×7): 3 mL via INTRAVENOUS

## 2015-08-28 NOTE — ED Notes (Addendum)
Pt recently diagnosed with Bronchitis given Z-pack and prednisone, and albuterol inhaler and cough medicine. And Nebulizer with albuterol. Audible wheezing. Worsening SOB. Vital signs stable. Pt tachy at 119.   Pt having to sleep in recliner for the last few nights.   No BM since Tuesday.

## 2015-08-28 NOTE — H&P (Signed)
Date: 08/28/2015               Patient Name:  Michael Martinez MRN: [id number]  DOB: 14-Nov-1955 Age / Sex: 60 y.o., male   PCP: Michael Norris, Michael Martinez         Medical Service: Internal Medicine Teaching Service         Attending Physician: Dr. [place] Headings, Michael Martinez    First Contact: Dr. Lovina Reach, MS4  Pager: [phone]  Second Contact: Dr. Charlott Rakes  Pager: [phone]        After Hours (After 5p/  First Contact Pager: [phone]  weekends / holidays): Second Contact Pager: [phone]   Chief Complaint: Shortness of breath  History of Present Illness: Michael Martinez is a 60 year old gentleman with chronic diastolic congestive heart failure, hypertension, hyperlipidemia, morbid obesity, obstructive sleep apnea, type 2 diabetes, chronic venous insufficiency complicated by recurrent cellulitis in the right lower extremity who presents to the emergency department today with several day history of cough, shortness of breath, wheezing. His wife Michael Martinez was present and also contributed to the interview.  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 He went to the beach with his wife but continued to feel worse and called his PCP and was seen by one of the PCPs colleague 3 days ago. Chest x-ray was obtained that showed no acute process, and he was given prednisone, Z-Pak, cough medicine, albuterol inhaler for acute viral bronchitis. Other than a fever documented at home to be 101.9 Fahrenheit, he denied any abdominal pain, nausea, vomiting, chest pain, for apnea, dizziness, diarrhea. He has not had a bowel movement during this interval as well. He reports taking all his medications at this time, including torsemide 20 mg, for chronic venous insufficiency and losartan 100 mg, for hypertension.  In the ED, he was given continuous albuterol nebulizer therapy and Solu-Medrol 125 g IV. Chest x-ray showed increased vascular congestion, and he  was called to be admitted as he was still requiring oxygen for to maintain normal saturations.  Meds: No current facility-administered medications for this encounter.    Allergies: Allergies as of 08/28/2015 - Review Complete 08/28/2015  Allergen Reaction Noted  . Penicillins  09/18/2011  . Zosyn [piperacillin sod-tazobactam so]  03/29/2014   Past Medical History  Diagnosis Date  . DVT (deep venous thrombosis) ([place]) 2006    "after ankle surgery"  . Gout   . Obesity   . Hypertension   . Hypercholesteremia   . CHF (congestive heart failure) ([place])   . OSA on CPAP   . Peripheral edema     chronic  . Clotting disorder ([facility])     deep vein thrombosis left leg  . Melanoma ([place])     left ear  . Shortness of breath   . Cellulitis and abscess of lower extremity 03/24/2014    RT LEG  . Diabetes mellitus     TYPE 2  . GERD (gastroesophageal reflux disease)   . Arthritis     RA  . Bronchitis    Past Surgical History  Procedure Laterality Date  . Hernia repair    . Tonsillectomy    . Skin cancer excision     Family History  Problem Relation Age of Onset  . Stroke Mother   . Hypertension Mother   . Asthma Mother   . Hypertension Father   . Asthma Sister   . Asthma Sister   .  Asthma Daughter   . Tuberculosis Mother    Social History   Social History  . Marital Status: Married    Spouse Name: N/A  . Number of Children: N/A  . Years of Education: N/A   Occupational History  . Not on file.   Social History Main Topics  . Smoking status: Never Smoker   . Smokeless tobacco: Never Used  . Alcohol Use: No  . Drug Use: No  . Sexual Activity: Not on file   Other Topics Concern  . Not on file   Social History Narrative    Review of Systems: 10 point review systems was negative unless otherwise noted above in history present illness.  Physical Exam: Blood pressure 108/63, pulse 115, temperature 98.1 F (36.7 C), temperature source Oral, resp. rate 29, height 6'  (1.829 m), weight 411 lb 14.6 oz (186.841 kg), SpO2 95 %. General: Morbidly obese middle-aged Caucasian male, sitting in chair, 2 L oxygen by nasal cannula HEENT: PERRL, EOMI, no scleral icterus, oropharynx clear Cardiac: Tachycardic, no rubs, murmurs or gallops Pulm: clear to auscultation bilaterally, no wheezes, rales, or rhonchi Abd: soft, obese abdomen, nontender, nondistended, BS present Ext: 2+ radial and dorsalis pedis pulses bilaterally, 1-2+ pitting edema in bilateral lower extremities, dry skin consistent with chronic venous insufficiency Neuro: responds to questions appropriately; moving all extremities freely   Lab results: Basic Metabolic Panel:  Recent Labs  08/28/15 0539  NA 136  K 3.9  CL 103  CO2 23  GLUCOSE 139*  BUN 20  CREATININE 1.41*  CALCIUM 8.7*   CBC:  Recent Labs  08/28/15 0539  WBC 9.2  NEUTROABS 6.4  HGB 13.0  HCT 40.3  MCV 84.3  PLT 195   Imaging results:  Dg Chest 2 View  08/28/2015  CLINICAL DATA:  Acute onset of shortness of breath, wheezing, cough, congestion and fever. Initial encounter. EXAM: CHEST  2 VIEW COMPARISON:  Chest radiograph performed 08/26/2015 FINDINGS: The lungs are well-aerated. Vascular congestion is noted, with minimal bibasilar atelectasis. There is no evidence of pleural effusion or pneumothorax. The heart is normal in size; the mediastinal contour is within normal limits. No acute osseous abnormalities are seen. IMPRESSION: Vascular congestion noted, with minimal bibasilar atelectasis. Electronically Signed   By: Garald Balding M.D.   On: 08/28/2015 06:08   Dg Chest 2 View  08/26/2015  CLINICAL DATA:  Cough for 2 days EXAM: CHEST  2 VIEW COMPARISON:  Nov 28, 2013 FINDINGS: Lungs are clear. Heart size and pulmonary vascularity are normal. No adenopathy. No bone lesions. IMPRESSION: No edema or consolidation. Electronically Signed   By: Lowella Grip III M.D.   On: 08/26/2015 14:12    Other results: EKG: Reviewed  and compared with 03/29/14. Sinus tachycardia Occasional PVCs  Assessment & Plan by Problem:  [name] is a 60 year old gentleman with chronic diastolic congestive heart failure, hypertension, hyperlipidemia, morbid obesity, obstructive sleep apnea, type 2 diabetes, chronic venous insufficiency complicated by recurrent cellulitis admitted for observation for acute viral bronchitis.    Acute viral bronchitis: Given the subacute onset, viral etiology is suspect. Bacterial causes do not appear likely given the chest x-ray findings on admission. The lack of clinical improvement with Z-Pak also consistent with a viral etiology though he did not complete the full course. Superimposed on poorly controlled OSA, it is possible he had increased oxygen requirement which warranted admission. PFTs available to [place] in [place] do not indicate an obstructive pattern given FEV1/FVC of 78%  back in 2004; no reversibility assessed with bronchodilator therapy either. Though he does carry a diagnosis of chronic diastolic congestive heart failure as noted on echo 11/14/13 with grade 1 diastolic dysfunction and EF 45-50% thought to be secondary to obstructive sleep apnea, BNP is only slightly elevated at 121.2, and he does not appear volume overloaded on physical exam to suggest acute heart failure exacerbation. He does have a prior history of provoked DVT in the setting of breaking his left foot though the duration of symptoms is not appear suspect for PE. -Check flu PCR -Give supplemental O2 to maintain saturations above 92% -Give acetaminophen 650 mg as needed for mild pain or fever -Give DuoNeb's every 6 hours for shortness of breath and wheezing -Give Robitussin-DM every 4 hours as needed for cough  Acute kidney injury: Creatinine 1.4 on admission, up from 1.1-1.2 back in September 2015. Given his tachycardia, suspected is prerenal in the setting of hypovolemia. -Give normal saline at 100 mL/hour 8 hours -hold  home torsemide and losartan  Chronic diastolic heart failure: As noted in echo above. Role of home diuretics is questionable and may be more prudent to assess the severity of his obstructive sleep apnea which could lead to right heart strain in the long term.  Obstructive sleep apnea: Has had the same machine since 2005 without any equipment replaced for at least the past year due to cost. Polysomnogram available to [place] in [facility] dated 05/04/03 notable for severe obstructive sleep apnea. -Continue home CPAP -Refer for sleep study to re-titrate his settings at discharge  Hypertension: Home medications include losartan 100 mg. Charted blood pressures are reassuring though in the ED, systolics trended in the [id number] which we suspect the aberrant. -losartan as noted above  Type 2 diabetes: A1c 7.9 in November 2015. Her medications include metformin 1000 mg twice daily and glimepiride 400 mg daily. Suspect he will have elevated sugars given recent course of prednisone and IV Solu-Medrol in the ED. -Start moderate sensitivity sliding scale insulin  Hyperlipidemia: Her medications include lovastatin 40 mg. -Start pravastatin 40 mg per hospital formulary  #FEN:  -Diet: Carb Modified  #DVT prophylaxis: Lovenox  #CODE STATUS: FULL CODE -Defer to wife Michael Martinez [phone] if patients lacks decision-making capacity -Confirmed with patient on admission   Dispo: Disposition is deferred at this time, awaiting improvement of current medical problems. Anticipated discharge in approximately 1-2 day(s).   The patient does have a current PCP Michael Norris, Michael Martinez) and does not need an [facility] hospital follow-up appointment after discharge.  The patient does not have transportation limitations that hinder transportation to clinic appointments.  Signed: Riccardo Dubin, Michael Martinez 08/28/2015, 11:19 AM

## 2015-08-28 NOTE — ED Notes (Signed)
Attempted report x1. 

## 2015-08-28 NOTE — H&P (Signed)
Date: 08/28/2015               Patient Name:  Michael Martinez MRN: [id number]  DOB: [birth year] Age / Sex: 60 y.o., male   PCP: Ashok Norris, MD              Medical Service: Internal Medicine Teaching Service              Attending Physician: Dr. [place] Headings, MD    First Contact: Lovina Reach, MS 4 Pager: [phone]  Second Contact: Dr. Charlott Rakes Pager: [phone]            After Hours (After 5p/  First Contact Pager: [phone]  weekends / holidays): Second Contact Pager: [phone]   Chief Complaint: Difficulty breathing  History of Present Illness: [name] is a 60 year old male with past medical history of diabetes mellitus, hypertension, DVT, OSA, and diastolic HF,  who presents with complaint of shortness of breath and cough for 1 week.  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  The cough is productive of yellow colored mucus, denies blood. His symptoms got progressively worse prompting evaluation at his PCP's office.  Patient was told he had bronchitis and was prescribed a Zpack, prednisone, albuterol inhaler, and cough medicine. A chest XR (08/26/15) was also obtained and found to be unremarkable. Patient reports that he finished the Z pack yesterday.  He has had difficulty sleeping and reports that he has been sleeping in a chair for the past couple of nights. He reports having a low grade fever at home last night 100.61F and his shortness of breath got worse, prompting him to go to the ED.  The SOB is worse with exertion.  Denies chest pain, worsening peripheral edema, or abdominal pain.  Does note that he has not had a bowel movement in 4 days.   Upon arrival in the ED, patient states that he was unable to speak in full sentences and required a wheelchair because of his shortness of breath.  While in the ED he received ipratropium nebulizer x1 and methylprednisolone 125 mg and placed on oxygen via nasal canula.  Patient  reports that he feels much better after the interventions in the ED.  A CXR was obtained and showed vascular congestion and minimal bibasilar atelectasis.     Meds: Current Facility-Administered Medications  Medication Dose Route Frequency Provider Last Rate Last Dose  . 0.9 %  sodium chloride infusion   Intravenous Continuous Rushil Sherrye Payor, MD      . acetaminophen (TYLENOL) tablet 650 mg  650 mg Oral Q6H PRN Rushil Sherrye Payor, MD       Or  . acetaminophen (TYLENOL) suppository 650 mg  650 mg Rectal Q6H PRN Rushil Sherrye Payor, MD      . albuterol (PROVENTIL HFA;VENTOLIN HFA) 108 (90 Base) MCG/ACT inhaler 2 puff  2 puff Inhalation Q6H PRN Rushil Patel V, MD      . albuterol (PROVENTIL) (2.5 MG/3ML) 0.083% nebulizer solution 2.5 mg  2.5 mg Nebulization Q6H Rushil Patel V, MD      . enoxaparin (LOVENOX) injection 40 mg  40 mg Subcutaneous Q24H Rushil Patel V, MD      . guaiFENesin-dextromethorphan (ROBITUSSIN DM) 100-10 MG/5ML syrup 5 mL  5 mL Oral Q4H PRN Rushil Patel V, MD      . insulin aspart (novoLOG) injection 0-15 Units  0-15 Units Subcutaneous TID WC Rushil Sherrye Payor,  MD      . insulin aspart (novoLOG) injection 0-5 Units  0-5 Units Subcutaneous QHS Rushil Patel V, MD      . pravastatin (PRAVACHOL) tablet 40 mg  40 mg Oral q1800 Rushil Patel V, MD      . senna-docusate (Senokot-S) tablet 1-2 tablet  1-2 tablet Oral QHS PRN Charlott Rakes V, MD      . sodium chloride flush (NS) 0.9 % injection 3 mL  3 mL Intravenous Q12H Rushil Sherrye Payor, MD        Allergies: Allergies as of 08/28/2015 - Review Complete 08/28/2015  Allergen Reaction Noted  . Penicillins  09/18/2011  . Zosyn [piperacillin sod-tazobactam so]  03/29/2014   Past Medical History  Diagnosis Date  . DVT (deep venous thrombosis) ([place]) 2006    "after ankle surgery"  . Gout   . Obesity   . Hypertension   . Hypercholesteremia   . CHF (congestive heart failure) ([place])   . OSA on CPAP   . Peripheral edema     chronic  . Clotting  disorder ([facility])     deep vein thrombosis left leg  . Melanoma ([place])     left ear  . Shortness of breath   . Cellulitis and abscess of lower extremity 03/24/2014    RT LEG  . Diabetes mellitus     TYPE 2  . GERD (gastroesophageal reflux disease)   . Arthritis     RA  . Bronchitis    Past Surgical History  Procedure Laterality Date  . Hernia repair    . Tonsillectomy    . Skin cancer excision     Family History  Problem Relation Age of Onset  . Stroke Mother   . Hypertension Mother   . Asthma Mother   . Hypertension Father   . Asthma Sister   . Asthma Sister   . Asthma Daughter   . Tuberculosis Mother    Social History   Social History  . Marital Status: Married    Spouse Name: N/A  . Number of Children: N/A  . Years of Education: N/A   Occupational History  . Not on file.   Social History Main Topics  . Smoking status: Never Smoker   . Smokeless tobacco: Never Used  . Alcohol Use: No  . Drug Use: No  . Sexual Activity: Not on file   Other Topics Concern  . Not on file   Social History Narrative    Review of Systems: Pertinent items are noted in HPI. 10 point review of systems otherwise negative  Physical Exam: Blood pressure 120/59, pulse 117, temperature 98.1 F (36.7 C), temperature source Oral, resp. rate 22, height 6' (1.829 m), weight 186.841 kg (411 lb 14.6 oz), SpO2 95 %. BP 120/59 mmHg  Pulse 117  Temp(Src) 98.1 F (36.7 C) (Oral)  Resp 22  Ht 6' (1.829 m)  Wt 186.841 kg (411 lb 14.6 oz)  BMI 55.85 kg/m2  SpO2 95%  General Appearance:   morbidly obese man sitting upright in chair, alert, cooperative, mild distress  Head:    Normocephalic, without obvious abnormality, atraumatic  Eyes:    PERRL, conjunctiva/corneas clear, EOM's intact, , both eyes       Ears:     External ear canals normal  Nose:   Nares normal, septum midline, mucosa normal, no drainage   Throat:   Lips, mucosa, and tongue normal; teeth and gums normal  Neck:    Supple, symmetrical, trachea midline,  no adenopathy;  No JVD  Back:     Symmetric, no curvature, ROM normal, no CVA tenderness  Lungs:     Speaks in full sentences, slightly increased work of breathing, clear to auscultation in all lung fields   Chest wall:    No tenderness or deformity  Heart:   Tachycardic, irregular rhythm (PVCs on EKG), S1 and S2 normal, no murmur, rub or gallop  Abdomen:     Soft, obese, non-tender, bowel sounds active all four quadrants, no masses, no organomegaly  Extremities:   Extremities normal, atraumatic, pitting edema noted to the level of the knee bilaterally  Pulses:   2+ and symmetric all extremities  Skin:   Skin color, texture, turgor normal, no rashes or lesions  Neurologic:   Moves all extremities, no focal neurological deficits    Lab results: Basic Metabolic Panel:  Recent Labs  08/28/15 0539  NA 136  K 3.9  CL 103  CO2 23  GLUCOSE 139*  BUN 20  CREATININE 1.41*  CALCIUM 8.7*   CBC:  Recent Labs  08/28/15 0539  WBC 9.2  NEUTROABS 6.4  HGB 13.0  HCT 40.3  MCV 84.3  PLT 195   Cardiac Enzymes: troponin (I-Stat): 0.01  BNP: 121.2   Lactic acid (I-Stat): 1.76   Imaging results:  Dg Chest 2 View  08/28/2015  CLINICAL DATA:  Acute onset of shortness of breath, wheezing, cough, congestion and fever. Initial encounter. EXAM: CHEST  2 VIEW COMPARISON:  Chest radiograph performed 08/26/2015 FINDINGS: The lungs are well-aerated. Vascular congestion is noted, with minimal bibasilar atelectasis. There is no evidence of pleural effusion or pneumothorax. The heart is normal in size; the mediastinal contour is within normal limits. No acute osseous abnormalities are seen. IMPRESSION: Vascular congestion noted, with minimal bibasilar atelectasis. Electronically Signed   By: Garald Balding M.D.   On: 08/28/2015 06:08   Dg Chest 2 View  08/26/2015  CLINICAL DATA:  Cough for 2 days EXAM: CHEST  2 VIEW COMPARISON:  Nov 28, 2013 FINDINGS: Lungs are  clear. Heart size and pulmonary vascularity are normal. No adenopathy. No bone lesions. IMPRESSION: No edema or consolidation. Electronically Signed   By: Lowella Grip III M.D.   On: 08/26/2015 14:12    Other results: EKG:(08/28/15 @ 0536) Sinus tachycardia with multiple PVCs. Right axis deviation.  Probable old anteroseptal infarct.   Assessment & Plan by Problem: Active Problems:   Shortness of breath  Mr. Edlund is a 60 year old male with history of diabetes mellitus, hypertension, DVT, OSA, and diastolic HF who presents with worsening shortness of breath and productive cough for 7 days in the setting of recent completion of zpack treatment and started prednisone as an outpatient who was found to have increased oxygen requirement, temperature of 100.83F, and CXR showing vascular congestion and minimal bibasilar atelectasis.    #Shortness of Breath and Cough with Recent URI Symptoms:  Differential diagnosis includes pulmonary infection (viral vs bacterial), CHF exacerbation, asthma/COPD exacerbation, and PE.  Infection is the most likely diagnosis.  Most likely viral in the setting of progressive worsening while on antibiotics, cough productive of yellow mucous, and unremarkable chest XR.  Bacterial pneumonia unlikely given clear lung sounds on physical exam and absence of evidence on CXR. CHF exacerbation is unlikely as patient does not appear to be fluid overloaded on physical exam, BNP is only slightly elevated, and CXR does not show fluid overload.  Asthma/COPD unlikely as past PFTs do not show obstructive  pattern and patient does not have a history of smoking. PE should be considered given patient's tachycardia and PMHx of DVT, however the DVT in the past was provoked after a foot fracture.  Patient has not been more sedentary then normal recently. The tachycardia is likely secondary to fever and volume depletion.  The prolonged course (7 days) and presence of productive cough make PE much  less likely.  - Obtain influenza panel - Continue oxygen with goal SpO2 > 94% - Start acetaminophen 650 mg PO or rectal Q6H prn mild pain or fever - Start albuterol 3 ml nebulizer Q6H prn wheezing, SOB - Start guaifenesin-dextromethorphan 100-10 mg/5 ml PO Q4H prn cough - Start ipratropium-albuterol 0.5-2.5 mg/56mL nebulizer Q6H  #Elevated Cr: Patient had an elevated Cr on admission of 1.41.  Baseline from 1 year ago is 1.16.  Likely secondary to dehydration in the setting of 1 week of URI symptoms and home torsemide.  - Encourage PO intake - Start NS 100 ml/hr for 8 hours - Hold home torsemide - Recheck BMP in the am  #History of Diastolic Heart Failure:  Last ECHO (11/14/13) showed EF 45-50% and grade 1 diastolic dysfunction.  Notes by Dr. Rockey Situ on that ECHO state that depressed EF could be secondary to OSA, unable to exclude ischemia. If symptoms did not improve with diuretic, would consider stress myoview. At this time, patient does not appear to have fluid overload on physical exam.  Patient takes torsemide 20 mg PO once to twice daily at home. Patient also wears compression stockings and has found this to be helpful.   - Hold home torsemide in setting of elevated Cr   #History of Obstructive Sleep Apnea: Patient has home CPAP machine with him.  He reports that he has had the same machine since 2005 and has not gotten replacement filters in at least 1 year.   - Continue home CPAP  #History of HTN: Home medication is losartan 100 mg PO daily. - Hold in setting of normal-low blood pressure upon admission.   #History of Diabetes: Patient takes metformin 1000mg  PO BID and glimepiride 4 mg PO daily at home for his DM2.  Last A1c 3 months ago was 7.9. - Hold home DM medications - Start insulin aspart (novolog) sliding scale with meals - Start insulin aspart (novolog) 0-5 units QHS   #History of Hypercholesterolemia: Patient takes lovastatin 40 mg at bedtime at home. - Start pravastatin 40  mg PO daily per hospital formulary   FENGI/Ppx::   Fluids: NS at 100 ml/hr  Electrolytes: no need to replace  Nutrition: Diet carb modified   GI: senna-docusate qhs  Ppx: Enoxaparin for dvt prophylaxis  This is a Careers information officer Note.  The care of the patient was discussed with Dr. Charlott Rakes and the assessment and plan was formulated with their assistance.  Please see their note for official documentation of the patient encounter.   Signed: Lovina Reach, Med Student 08/28/2015, 11:39 AM

## 2015-08-28 NOTE — Progress Notes (Signed)
Patient came to the floor at 1117, alert and oriented, denies pain, but have shortness of breath with activity, no fever. MD order to  Place patient on isolation to r/o flu/h1n1 at 12:31. Educate wife to put the mask on but she refuse she said she is been with him for 5 days.will continue to monitor patient.

## 2015-08-28 NOTE — ED Provider Notes (Signed)
CSN: [id number]     Arrival date & time 08/28/15  0457 History   First MD Initiated Contact with Patient 08/28/15 [phone]     Chief Complaint  Patient presents with  . Bronchitis  . Shortness of Breath     (Consider location/radiation/quality/duration/timing/severity/associated sxs/prior Treatment) HPI Patient presents to the emergency department with increasing shortness of breath that has been ongoing over the last week.  The patient states he was recently diagnosed bronchitis and given a Z-Pak, prednisone and also an inhaler.  Patient states that these treatments and does not seem to be very effective.  He states he continues with more short of breath.  Patient denies chest pain, weakness, dizziness, headache, blurred vision, back pain, neck pain, incontinence, abdominal pain, nausea, vomiting, diarrhea, rash, edema, near syncope or syncope.  The patient states that activity seems to make his condition worse Past Medical History  Diagnosis Date  . DVT (deep venous thrombosis) ([place]) 2006    "after ankle surgery"  . Gout   . Obesity   . Hypertension   . Hypercholesteremia   . CHF (congestive heart failure) ([place])   . OSA on CPAP   . Peripheral edema     chronic  . Clotting disorder ([facility])     deep vein thrombosis left leg  . Melanoma ([place])     left ear  . Shortness of breath   . Cellulitis and abscess of lower extremity 03/24/2014    RT LEG  . Diabetes mellitus     TYPE 2  . GERD (gastroesophageal reflux disease)   . Arthritis     RA  . Bronchitis    Past Surgical History  Procedure Laterality Date  . Hernia repair    . Tonsillectomy    . Skin cancer excision     Family History  Problem Relation Age of Onset  . Stroke Mother   . Hypertension Mother   . Asthma Mother   . Hypertension Father   . Asthma Sister   . Asthma Sister   . Asthma Daughter   . Tuberculosis Mother    Social History  Substance Use Topics  . Smoking status: Never Smoker   . Smokeless tobacco:  Never Used  . Alcohol Use: No    Review of Systems    Allergies  Penicillins and Zosyn  Home Medications   Prior to Admission medications   Medication Sig Start Date End Date Taking? Authorizing Provider  acetaminophen (TYLENOL) 500 MG tablet Take 1,000 mg by mouth every 6 (six) hours as needed for mild pain.   Yes Historical Provider, MD  albuterol (PROVENTIL HFA) 108 (90 Base) MCG/ACT inhaler Inhale 2 puffs into the lungs every 6 (six) hours as needed for wheezing or shortness of breath. 08/26/15  Yes Roselee Nova, MD  azithromycin (ZITHROMAX) 250 MG tablet 2 tabs po x day 1, then 1 tab po q day x 4 days 08/26/15  Yes Roselee Nova, MD  glimepiride (AMARYL) 4 MG tablet Take 1 tablet (4 mg total) by mouth daily. 05/04/15  Yes Ashok Norris, MD  guaiFENesin-codeine (CHERATUSSIN AC) 100-10 MG/5ML syrup Take 10 mLs by mouth 4 (four) times daily as needed for cough. 08/26/15  Yes Roselee Nova, MD  HYDROcodone-acetaminophen (NORCO/VICODIN) 5-325 MG tablet Take 1 tablet by mouth every 8 (eight) hours as needed. 05/04/15  Yes Ashok Norris, MD  ibuprofen (ADVIL,MOTRIN) 200 MG tablet Take 400 mg by mouth every 6 (six) hours as needed for  moderate pain.   Yes Historical Provider, MD  indomethacin (INDOCIN) 25 MG capsule Take 1 capsule (25 mg total) by mouth daily as needed (gout). For 3 days 05/04/15  Yes Ashok Norris, MD  losartan (COZAAR) 100 MG tablet Take 1 tablet (100 mg total) by mouth daily. 05/04/15  Yes Ashok Norris, MD  lovastatin (MEVACOR) 40 MG tablet Take 1 tablet (40 mg total) by mouth at bedtime. 05/04/15  Yes Ashok Norris, MD  metFORMIN (GLUCOPHAGE) 1000 MG tablet Take 1 tablet (1,000 mg total) by mouth 2 (two) times daily. 05/04/15  Yes Ashok Norris, MD  naproxen (NAPROSYN) 500 MG tablet TAKE 1 TABLET(500 MG) BY MOUTH TWICE DAILY 06/03/15  Yes Ashok Norris, MD  predniSONE (DELTASONE) 10 MG tablet Take 1 tablet (10 mg total) by mouth daily with breakfast. 50  40 30 20 10  then STOP 08/26/15  Yes Roselee Nova, MD  torsemide (DEMADEX) 20 MG tablet TAKE 1 TABLET BY MOUTH ONCE TO TWICE DAILY 07/08/15  Yes Ashok Norris, MD  valACYclovir (VALTREX) 500 MG tablet Take 1 tablet (500 mg total) by mouth 2 (two) times daily as needed (for fever blisters). For 5 days 05/04/15  Yes Ashok Norris, MD  diazepam (VALIUM) 5 MG tablet Take 1 tablet (5 mg total) by mouth every 8 (eight) hours as needed. For muscle spasm Patient not taking: Reported on 08/28/2015 05/04/15   Ashok Norris, MD  mupirocin ointment (BACTROBAN) 2 % Apply 1 application topically 2 (two) times daily. Patient not taking: Reported on 08/28/2015 05/05/15   Ashok Norris, MD  naproxen (NAPROSYN) 500 MG tablet Take 1 tablet (500 mg total) by mouth 2 (two) times daily. Patient not taking: Reported on 08/28/2015 05/04/15   Ashok Norris, MD  naproxen (NAPROSYN) 500 MG tablet TAKE 1 TABLET(500 MG) BY MOUTH TWICE DAILY Patient not taking: Reported on 08/28/2015 07/19/15   Ashok Norris, MD  vardenafil (LEVITRA) 20 MG tablet Take 1 tablet (20 mg total) by mouth daily as needed for erectile dysfunction. Patient not taking: Reported on 08/28/2015 05/04/15   Ashok Norris, MD   BP 108/63 mmHg  Pulse 134  Temp(Src) 98.1 F (36.7 C) (Oral)  Resp 24  Ht 6' (1.829 m)  Wt 188.243 kg  BMI 56.27 kg/m2  SpO2 96% Physical Exam  Constitutional: He is oriented to person, place, and time. He appears well-developed and well-nourished. No distress.  HENT:  Head: Normocephalic and atraumatic.  Mouth/Throat: Oropharynx is clear and moist.  Eyes: Pupils are equal, round, and reactive to light.  Neck: Normal range of motion. Neck supple.  Cardiovascular: Normal rate, regular rhythm and normal heart sounds.  Exam reveals no gallop and no friction rub.   No murmur heard. Pulmonary/Chest: Effort normal and breath sounds normal. No respiratory distress. He has no wheezes.  Abdominal: Soft. Bowel sounds are  normal. He exhibits no distension. There is no tenderness.  Neurological: He is alert and oriented to person, place, and time. He exhibits normal muscle tone. Coordination normal.  Skin: Skin is warm and dry. No rash noted. No erythema.  Psychiatric: He has a normal mood and affect. His behavior is normal.  Nursing note and vitals reviewed.   ED Course  Procedures (including critical care time) Labs Review Labs Reviewed  CBC WITH DIFFERENTIAL/PLATELET - Abnormal; Notable for the following:    Monocytes Absolute 1.2 (*)    All other components within normal limits  BASIC METABOLIC PANEL - Abnormal; Notable for the following:    Glucose,  Bld 139 (*)    Creatinine, Ser 1.41 (*)    Calcium 8.7 (*)    GFR calc non Af Amer 52 (*)    All other components within normal limits  BRAIN NATRIURETIC PEPTIDE - Abnormal; Notable for the following:    B Natriuretic Peptide 121.2 (*)    All other components within normal limits  I-STAT TROPOININ, ED  I-STAT CG4 LACTIC ACID, ED    Imaging Review Dg Chest 2 View  08/28/2015  CLINICAL DATA:  Acute onset of shortness of breath, wheezing, cough, congestion and fever. Initial encounter. EXAM: CHEST  2 VIEW COMPARISON:  Chest radiograph performed 08/26/2015 FINDINGS: The lungs are well-aerated. Vascular congestion is noted, with minimal bibasilar atelectasis. There is no evidence of pleural effusion or pneumothorax. The heart is normal in size; the mediastinal contour is within normal limits. No acute osseous abnormalities are seen. IMPRESSION: Vascular congestion noted, with minimal bibasilar atelectasis. Electronically Signed   By: Garald Balding M.D.   On: 08/28/2015 06:08   Dg Chest 2 View  08/26/2015  CLINICAL DATA:  Cough for 2 days EXAM: CHEST  2 VIEW COMPARISON:  Nov 28, 2013 FINDINGS: Lungs are clear. Heart size and pulmonary vascularity are normal. No adenopathy. No bone lesions. IMPRESSION: No edema or consolidation. Electronically Signed   By:  Lowella Grip III M.D.   On: 08/26/2015 14:12   I have personally reviewed and evaluated these images and lab results as part of my medical decision-making.   EKG Interpretation   Date/Time:  Saturday August 28 2015 05:13:36 EST Ventricular Rate:  111 PR Interval:  183 QRS Duration: 99 QT Interval:  303 QTC Calculation: 412 R Axis:   144 Text Interpretation:  Sinus tachycardia Multiple ventricular premature  complexes Low voltage with right axis deviation Probable anteroseptal  infarct, old ED PHYSICIAN INTERPRETATION AVAILABLE IN [facility]  Confirmed by TEST, Record ([id number]) on 08/29/2015 11:05:48 AM       Patient needed admission to the hospital for hypoxia.  Patient is advised of the plan and all questions were answered.  I spoke with the [facility] who agrees to come see the patient for further evaluation   Dalia Heading, PA-C 08/29/15 [place], MD 08/30/15 1655

## 2015-08-28 NOTE — Progress Notes (Signed)
Pt. tested positive for Influenza A, doctor notified, new orders given, will continue to monitor

## 2015-08-29 ENCOUNTER — Observation Stay (HOSPITAL_COMMUNITY): Payer: Self-pay

## 2015-08-29 LAB — BLOOD GAS, ARTERIAL
Acid-base deficit: 2.5 mmol/L — ABNORMAL HIGH (ref 0.0–2.0)
Bicarbonate: 21.7 mEq/L (ref 20.0–24.0)
DRAWN BY: 10552
O2 Content: 3 L/min
O2 Saturation: 96.6 %
PCO2 ART: 37 mmHg (ref 35.0–45.0)
PH ART: 7.386 (ref 7.350–7.450)
Patient temperature: 98.6
TCO2: 22.9 mmol/L (ref 0–100)
pO2, Arterial: 90 mmHg (ref 80.0–100.0)

## 2015-08-29 LAB — GLUCOSE, CAPILLARY
GLUCOSE-CAPILLARY: 138 mg/dL — AB (ref 65–99)
GLUCOSE-CAPILLARY: 212 mg/dL — AB (ref 65–99)
GLUCOSE-CAPILLARY: 250 mg/dL — AB (ref 65–99)
Glucose-Capillary: 207 mg/dL — ABNORMAL HIGH (ref 65–99)

## 2015-08-29 LAB — BASIC METABOLIC PANEL
Anion gap: 10 (ref 5–15)
BUN: 24 mg/dL — AB (ref 6–20)
CHLORIDE: 101 mmol/L (ref 101–111)
CO2: 26 mmol/L (ref 22–32)
CREATININE: 1.34 mg/dL — AB (ref 0.61–1.24)
Calcium: 8.9 mg/dL (ref 8.9–10.3)
GFR calc Af Amer: >60 mL/min (ref >60)
GFR calc non Af Amer: 56 mL/min — ABNORMAL LOW (ref >60)
Glucose, Bld: 139 mg/dL — ABNORMAL HIGH (ref 65–99)
Potassium: 4.6 mmol/L (ref 3.5–5.1)
SODIUM: 137 mmol/L (ref 135–145)

## 2015-08-29 MED ORDER — IPRATROPIUM-ALBUTEROL 0.5-2.5 (3) MG/3ML IN SOLN
3.0000 mL | RESPIRATORY_TRACT | Status: DC
  Administered 2015-08-29 – 2015-08-30 (×7): 3 mL via RESPIRATORY_TRACT
  Filled 2015-08-29 (×6): qty 3

## 2015-08-29 MED ORDER — INSULIN ASPART 100 UNIT/ML ~~LOC~~ SOLN
0.0000 [IU] | Freq: Three times a day (TID) | SUBCUTANEOUS | Status: DC
  Administered 2015-08-30 (×2): 4 [IU] via SUBCUTANEOUS
  Administered 2015-08-30: 7 [IU] via SUBCUTANEOUS

## 2015-08-29 MED ORDER — SALINE SPRAY 0.65 % NA SOLN
1.0000 | NASAL | Status: DC | PRN
  Administered 2015-08-29 – 2015-08-31 (×3): 1 via NASAL
  Filled 2015-08-29: qty 44

## 2015-08-29 MED ORDER — BUDESONIDE 0.25 MG/2ML IN SUSP
0.2500 mg | Freq: Two times a day (BID) | RESPIRATORY_TRACT | Status: DC
  Administered 2015-08-29 – 2015-08-31 (×5): 0.25 mg via RESPIRATORY_TRACT
  Filled 2015-08-29 (×6): qty 2

## 2015-08-29 NOTE — Progress Notes (Signed)
Subjective: Patient is sitting upright in hospital recliner this morning.  Reports he slept in the recliner last night.  He is aware that his tested positive for Influenza A.  He states that he did get a flu shot this season.  Patient does get short of breath walking to the bathroom when he takes off his oxygen.  Patient asked about CHF this morning because he was told by nursing staff that he had CHF exacerbation.  He reports that he was not allowed to drink a lot of water and was given a packet of information about CHF.  He found this to be quite unsettling, but reports great relief in knowing that his current condition is due to flu and not CHF.  He is followed by a cardiologist who told him that he does not have CHF but that the right side of his heart does not work as well as it used to (which is consistent with ECHO report from 5/15).  This was attributed to the longstanding OSA.    Objective: Vital signs in last 24 hours: Filed Vitals:   08/29/15 0103 08/29/15 0439 08/29/15 0512 08/29/15 0823  BP:  124/66 118/64   Pulse:  113 114   Temp:  99.9 F (37.7 C) 99.6 F (37.6 C)   TempSrc:  Oral Oral   Resp:  20 24   Height:      Weight:  185.43 kg (408 lb 12.8 oz)    SpO2: 98% 97% 97% 96%   Weight change: -1.402 kg (-3 lb 1.4 oz)  Intake/Output Summary (Last 24 hours) at 08/29/15 0947 Last data filed at 08/29/15 0800  Gross per 24 hour  Intake 2833.66 ml  Output   2100 ml  Net 733.66 ml   BP 118/64 mmHg  Pulse 114  Temp(Src) 99.6 F (37.6 C) (Oral)  Resp 24  Ht 6' (1.829 m)  Wt 185.43 kg (408 lb 12.8 oz)  BMI 55.43 kg/m2  SpO2 96%  General Appearance:    Morbidly obese man sitting upright in hospital chair, alert, cooperative, pleasant  Head:    Normocephalic, without obvious abnormality, atraumatic  Eyes:    PERRL, conjunctiva/corneas clear, EOM's intact, fundi    benign, both eyes       Nose:   Nares normal, septum midline, mucosa normal, congested  Throat:   Lips,  mucosa, and tongue normal; teeth and gums normal  Neck:   Supple, symmetrical, trachea midline, no adenopathy;       thyroid:  No enlargement/tenderness/nodules; no JVD  Back:     Symmetric, no curvature, ROM normal  Lungs:     Clear to auscultation bilaterally, slight increased work of breathing  Chest wall:    No tenderness or deformity  Heart:   tachycardia, S1 and S2 normal, no murmur, rub   or gallop  Abdomen:     Obese, soft, non-tender, bowel sounds active all four quadrants,    no masses, no organomegaly  Extremities:   Extremities normal, atraumatic, bilateral edema to the level of the knee  Pulses:   2+ and symmetric all extremities  Skin:   Skin color, texture, turgor normal, no rashes or lesions  Neurologic:   No focal neurological deficits, moves all extremities equally   Lab Results: Basic Metabolic Panel:  Recent Labs  08/28/15 0539 08/29/15 0301  NA 136 137  K 3.9 4.6  CL 103 101  CO2 23 26  GLUCOSE 139* 139*  BUN 20 24*  CREATININE 1.41* 1.34*  CALCIUM 8.7* 8.9   CBC:  Recent Labs  08/28/15 0539  WBC 9.2  NEUTROABS 6.4  HGB 13.0  HCT 40.3  MCV 84.3  PLT 195   CBG:  Recent Labs  08/28/15 1210 08/28/15 1710 08/28/15 2116 08/29/15 0530  GLUCAP 299* 377* 238* 138*   Misc. Labs: Influenza A: + Influenza B: - H1N1: -   Studies/Results: Dg Chest 2 View  08/28/2015  CLINICAL DATA:  Acute onset of shortness of breath, wheezing, cough, congestion and fever. Initial encounter. EXAM: CHEST  2 VIEW COMPARISON:  Chest radiograph performed 08/26/2015 FINDINGS: The lungs are well-aerated. Vascular congestion is noted, with minimal bibasilar atelectasis. There is no evidence of pleural effusion or pneumothorax. The heart is normal in size; the mediastinal contour is within normal limits. No acute osseous abnormalities are seen. IMPRESSION: Vascular congestion noted, with minimal bibasilar atelectasis. Electronically Signed   By: Garald Balding M.D.   On:  08/28/2015 06:08   Medications: I have reviewed the patient's current medications. Scheduled Meds: . enoxaparin (LOVENOX) injection  90 mg Subcutaneous Q24H  . insulin aspart  0-15 Units Subcutaneous TID WC  . insulin aspart  0-5 Units Subcutaneous QHS  . ipratropium-albuterol  3 mL Nebulization Q6H  . oseltamivir  75 mg Oral BID  . pravastatin  40 mg Oral q1800  . sodium chloride flush  3 mL Intravenous Q12H   Continuous Infusions:  PRN Meds:.acetaminophen **OR** acetaminophen, albuterol, guaiFENesin-dextromethorphan, senna-docusate, zolpidem Assessment/Plan: Active Problems:   Shortness of breath   Influenza  #Influenza A: Patient tested positive for influenza A.  Patient was started on oseltamivir 75 mg PO BID last night. Patient states that he did have a flu shot this year.  Patient is currently on 2L [place], so we will plan to evaluate O2 sat with ambulation today. - Continue oseltamivir 75 mg PO BID for 5 days (last dose 09/03/15 in the am) - Continue supplemental O2 to maintain sats about 92% - Continue acetaminophen 650 mg prn mild pain or fever - Continue albuterol 3 ml nebulizer Q6H prn wheezing, SOB - Continue ipratropium-albuterol 0.5-2.5 mg/70mL nebulizer Q6H - Continue guaifenesin-dextromethorphan 100-10 mg/5 ml PO Q4H prn cough - SpO2 with ambulation today to evaluate oxygen requirements   #Elevated Cr: Patient had an elevated Cr on admission of 1.41.Down trending today to 1.34.  Baseline from 1 year ago is 1.16. Likely secondary to dehydration in the setting of 1 week of URI symptoms and home torsemide.  - Encourage PO hydration - Hold home torsemide - Recheck BMP in the am   #History of Diastolic Heart Failure: Last ECHO (11/14/13) showed EF 45-50% and grade 1 diastolic dysfunction. Notes by Dr. Rockey Situ on that ECHO state that depressed EF could be secondary to OSA, unable to exclude ischemia. If symptoms did not improve with diuretic, would consider stress myoview. At  this time, patient does not appear to have fluid overload on physical exam. Patient takes torsemide 20 mg PO once to twice daily at home. Patient also wears compression stockings and has found this to be helpful.  - Hold home torsemide in setting of elevated Cr  #History of Obstructive Sleep Apnea: Patient has home CPAP machine with him. He reports that he has had the same machine since 2005 and has not gotten replacement filters in at least 1 year.  - Continue home CPAP  #History of HTN: Home medication is losartan 100 mg PO daily. BP was lower end of normal upon admission.  Blood pressures  improved overnight.  In the setting of acute flu and likely being fluid down based on serum Cr, will continue to hold losartan.  - Continue to hold home htn medication  #History of Diabetes: Elevated CBG upon admission, likely due to recent PO prednisone use. Patient takes metformin 1000mg  PO BID and glimepiride 4 mg PO daily at home for his DM2. Last A1c 3 months ago was 7.9. Required 17 U SSI yesterday.  - Hold home DM medications - Continue insulin aspart (novolog) sliding scale with meals - Continue insulin aspart (novolog) 0-5 units QHS   #History of Hypercholesterolemia: Patient takes lovastatin 40 mg at bedtime at home. - Continue pravastatin 40 mg PO daily per hospital formulary   FENGI/Ppx::  Fluids: Encourage PO hydration Electrolytes: no need to replace Nutrition: Diet carb modified  GI: senna-docusate qhs Ppx: Enoxaparin for dvt prophylaxis  This is a Careers information officer Note.  The care of the patient was discussed with Dr. Charlott Rakes and the assessment and plan formulated with their assistance.  Please see their attached note for official documentation of the daily encounter.     Lovina Reach, Med Student 08/29/2015, 9:47 AM

## 2015-08-29 NOTE — Progress Notes (Signed)
Patient having shortness, v/s stable oxygen saturation 99-100%/4L, constant productive coughing, patient alert and oriented but look tired. Respiratory treatment given, MD notified. See  Manage order for new orders.will continue to monitor patient.

## 2015-08-29 NOTE — Significant Event (Addendum)
Rapid Response Event Note  Overview: Time Called: 1239 Arrival Time: [id number] Event Type: Respiratory  Initial Focused Assessment:  Called by primary RN for "second set of eyes", for patient with respiratory distress.  Upon my arrival to patients room, Rn at bedside.  Patient sitting in chair on nasal cannula 4 LPM.  Appears to be slightly SOB, with some slight increased WOB, however can speak in full sentences.  Patient states just finished a breathing treatment with no improvement.  Patient states he is getting tired.     Interventions:  VSS, 97% on 4 LPM RR 24.  Breath Sounds wheezes bilaterally.  MD at bedside to evaluate patient   Event Summary:  RN to call if assistance    at      at          [facility], [place]

## 2015-08-29 NOTE — Progress Notes (Signed)
Subjective: This morning, he was sitting upright in chair. He reported not having a good night. He was also confused as to why he was provided packet entitled, "Living with heart failure," and why he had been instructed by the nursing staff to restrict his fluid intake. As he noted to [place] on admission yesterday in the emergency department, his cardiologist has never told him that he has had systolic dysfunction or pump failure.  Objective: Vital signs in last 24 hours: Filed Vitals:   08/29/15 0103 08/29/15 0439 08/29/15 0512 08/29/15 0823  BP:  124/66 118/64   Pulse:  113 114   Temp:  99.9 F (37.7 C) 99.6 F (37.6 C)   TempSrc:  Oral Oral   Resp:  20 24   Height:      Weight:  408 lb 12.8 oz (185.43 kg)    SpO2: 98% 97% 97% 96%   Weight change: -3 lb 1.4 oz (-1.402 kg)  Intake/Output Summary (Last 24 hours) at 08/29/15 1105 Last data filed at 08/29/15 0800  Gross per 24 hour  Intake 2833.66 ml  Output   2100 ml  Net 733.66 ml   General: Morbidly obese, Caucasian male, sitting in chair, 2 L O2 by nasal cannula HEENT: PERRL, EOMI, no scleral icterus, oropharynx clear Cardiac: RRR, no rubs, murmurs or gallops Pulm: clear to auscultation bilaterally, no wheezes, rales, or rhonchi Abd: soft, nontender, nondistended, BS present Ext: warm and well perfused, no pedal edema Neuro: responds to questions appropriately; moving all extremities freely   Lab Results: Basic Metabolic Panel:  Recent Labs Lab 08/28/15 0539 08/29/15 0301  NA 136 137  K 3.9 4.6  CL 103 101  CO2 23 26  GLUCOSE 139* 139*  BUN 20 24*  CREATININE 1.41* 1.34*  CALCIUM 8.7* 8.9   CBC:  Recent Labs Lab 08/28/15 0539  WBC 9.2  NEUTROABS 6.4  HGB 13.0  HCT 40.3  MCV 84.3  PLT 195   CBG:  Recent Labs Lab 08/28/15 1210 08/28/15 1710 08/28/15 2116 08/29/15 0530  GLUCAP 299* 377* 238* 138*   Micro Results: No results found for this or any previous visit (from the past 240  hour(s)). Studies/Results: Dg Chest 2 View  08/28/2015  CLINICAL DATA:  Acute onset of shortness of breath, wheezing, cough, congestion and fever. Initial encounter. EXAM: CHEST  2 VIEW COMPARISON:  Chest radiograph performed 08/26/2015 FINDINGS: The lungs are well-aerated. Vascular congestion is noted, with minimal bibasilar atelectasis. There is no evidence of pleural effusion or pneumothorax. The heart is normal in size; the mediastinal contour is within normal limits. No acute osseous abnormalities are seen. IMPRESSION: Vascular congestion noted, with minimal bibasilar atelectasis. Electronically Signed   By: Garald Balding M.D.   On: 08/28/2015 06:08   Medications: I have reviewed the patient's current medications. Scheduled Meds: . enoxaparin (LOVENOX) injection  90 mg Subcutaneous Q24H  . insulin aspart  0-15 Units Subcutaneous TID WC  . insulin aspart  0-5 Units Subcutaneous QHS  . ipratropium-albuterol  3 mL Nebulization Q6H  . oseltamivir  75 mg Oral BID  . pravastatin  40 mg Oral q1800  . sodium chloride flush  3 mL Intravenous Q12H   Continuous Infusions:  PRN Meds:.acetaminophen **OR** acetaminophen, albuterol, guaiFENesin-dextromethorphan, senna-docusate, zolpidem Assessment/Plan:  Michael Martinez is a 60 year old gentleman with multiple medical comorbidities who presented with seven-day history of influenza A.  Influenza A: Tested positive yesterday. Still with oxygen requirement on 2 L by nasal cannula. -Started also Tamiflu  75 mg twice daily 5 days though suspect benefit will be limited -Check oxygen saturations while ambulating off supplemental oxygen to assess his oxygen need -Continue DuoNeb's every 6 hours -Continue guaifenesin-dextromethorphan every 4 hours as needed for cough -Continue acetaminophen 650 mg as needed for mild pain or fever  Acute kidney injury: Creatinine appears to be downtrending from 1.4 admission to 1.3 today with baseline around 1.1. Improved with  IV hydration. -Encouraged by mouth intake today -Continue holding home torsemide and losartan  Chronic diastolic heart failure: Echo 11/14/13 with  grade 1 diastolic dysfunction and EF 45-50% thought to be secondary to obstructive sleep apnea. Role of home diuretics is questionable and may be more prudent to assess the severity of his obstructive sleep apnea which could lead to right heart strain in the long term. He is not in an acute exacerbation.  Obstructive sleep apnea: Has had the same machine since 2005 without any equipment replaced for at least the past year due to cost. Polysomnogram available to [place] in Care Everywhere dated 05/04/03 notable for severe obstructive sleep apnea. -Continue home CPAP -Refer for sleep study to re-titrate his settings at discharge  Hypertension: Home medications include losartan 100 mg. Systolic blood pressures improved overnight to the low 100s to 120s. -losartan as noted above  Type 2 diabetes: A1c 7.9 in November 2015. Her medications include metformin 1000 mg twice daily and glimepiride 400 mg daily. CBGs mainly in the low 100s. -Continue moderate sensitivity sliding scale insulin  Hyperlipidemia: Her medications include lovastatin 40 mg. -Start pravastatin 40 mg per hospital formulary  Dispo: Disposition is deferred at this time, awaiting improvement of current medical problems and ability to ambulate with supplemental oxygen.  Anticipated discharge in approximately 0-1 day(s).   The patient does have a current PCP Ashok Norris, MD) and does not need an [facility] hospital follow-up appointment after discharge.  The patient does not know have transportation limitations that hinder transportation to clinic appointments.  .Services Needed at time of discharge: Y = Yes, Blank = No PT:   OT:   RN:   Equipment:   Other:       Michael Dubin, MD 08/29/2015, 11:05 AM

## 2015-08-29 NOTE — Progress Notes (Addendum)
SUBJECTIVE Called by RN for increased work of breathing. On arrival, patient is able to speak full sentences though does appear tachypneic.  OBJECTIVE Filed Vitals:   08/29/15 0512 08/29/15 1200  BP: 118/64 116/56  Pulse: 114 109  Temp: 99.6 F (37.6 C) 97.5 F (36.4 C)  Resp: 24 20   General: Sitting in chair, tired appearing Cardiac: Tachycardic, no rubs, murmurs or gallops Pulm: Wheezing noted all throughout anterior lung fields Neuro: Answering questions appropriately  ASSESSMENT Suspect acute respiratory failure in the setting of influenza. Pulmonary edema is certainly a possibility as he did receive IV fluids overnight for his acute kidney injury though no documented systolic dysfunction on record. No diagnosis of COPD or asthma as well.  PLAN -Check stat ABG and chest x-ray -Retry nebulizer treatment with DuoNeb's and increase frequency to every 3 hours -Add budesonide twice daily -Consider transfer to stepdown and BiPAP if his respiratory status does not improve  ADDENDUM 08/29/2015  1:57 PM:  Upon reassessment, patient reported feeling better. Given concerns, we will transfer to stepdown for closer monitoring. ABG pending. CXR appeared unchanged from prior on personal review.  ADDENDUM 08/29/2015  2:14 PM:  ABG with pH 7.386, CO2 37.0, O2 90.0, bicarbonate 21.7. Suspect he may have been anxious upon prior assessment but will still pursue transfer to stepdown for closer supervision given his comorbidities.

## 2015-08-29 NOTE — Progress Notes (Signed)
Patient alert oriented, report given to [facility] RN.Patient transfer to stepdown per order.

## 2015-08-30 DIAGNOSIS — F419 Anxiety disorder, unspecified: Secondary | ICD-10-CM

## 2015-08-30 LAB — GLUCOSE, CAPILLARY
GLUCOSE-CAPILLARY: 211 mg/dL — AB (ref 65–99)
Glucose-Capillary: 185 mg/dL — ABNORMAL HIGH (ref 65–99)
Glucose-Capillary: 178 mg/dL — ABNORMAL HIGH (ref 65–99)
Glucose-Capillary: 190 mg/dL — ABNORMAL HIGH (ref 65–99)

## 2015-08-30 LAB — BASIC METABOLIC PANEL
ANION GAP: 12 (ref 5–15)
BUN: 19 mg/dL (ref 6–20)
CALCIUM: 8.4 mg/dL — AB (ref 8.9–10.3)
CO2: 22 mmol/L (ref 22–32)
CREATININE: 1.14 mg/dL (ref 0.61–1.24)
Chloride: 101 mmol/L (ref 101–111)
GLUCOSE: 197 mg/dL — AB (ref 65–99)
Potassium: 4.4 mmol/L (ref 3.5–5.1)
Sodium: 135 mmol/L (ref 135–145)

## 2015-08-30 LAB — MRSA PCR SCREENING: MRSA BY PCR: NEGATIVE

## 2015-08-30 MED ORDER — IPRATROPIUM-ALBUTEROL 0.5-2.5 (3) MG/3ML IN SOLN
3.0000 mL | RESPIRATORY_TRACT | Status: DC
  Administered 2015-08-30 – 2015-08-31 (×4): 3 mL via RESPIRATORY_TRACT
  Filled 2015-08-30 (×4): qty 3

## 2015-08-30 MED ORDER — INSULIN ASPART 100 UNIT/ML ~~LOC~~ SOLN
0.0000 [IU] | Freq: Three times a day (TID) | SUBCUTANEOUS | Status: DC
  Administered 2015-08-31: 4 [IU] via SUBCUTANEOUS

## 2015-08-30 MED ORDER — VALACYCLOVIR HCL 500 MG PO TABS
500.0000 mg | ORAL_TABLET | Freq: Two times a day (BID) | ORAL | Status: DC
  Administered 2015-08-30 – 2015-08-31 (×2): 500 mg via ORAL
  Filled 2015-08-30 (×3): qty 1

## 2015-08-30 MED ORDER — IPRATROPIUM-ALBUTEROL 0.5-2.5 (3) MG/3ML IN SOLN
3.0000 mL | RESPIRATORY_TRACT | Status: DC | PRN
  Filled 2015-08-30: qty 3

## 2015-08-30 NOTE — Progress Notes (Signed)
Subjective: This morning, he reports feeling better. Overnight he had no events. He still struggles with feeling short of breath while walking to the bathroom. He does not feel as anxious as he did yesterday.  Objective: Vital signs in last 24 hours: Filed Vitals:   08/30/15 0058 08/30/15 0316 08/30/15 0402 08/30/15 0500  BP: 96/81  124/76   Pulse:      Temp: 99 F (37.2 C)  98.7 F (37.1 C)   TempSrc: Axillary  Axillary   Resp: 24  22   Height:      Weight:    396 lb 14.4 oz (180.033 kg)  SpO2: 99% 100% 100%    Weight change: -1 lb 6.6 oz (-0.641 kg)  Intake/Output Summary (Last 24 hours) at 08/30/15 0707 Last data filed at 08/29/15 2100  Gross per 24 hour  Intake    600 ml  Output    100 ml  Net    500 ml   General: Morbidly obese, Caucasian male, sitting in chair, 4L O2 by nasal cannula HEENT: PERRL, EOMI, no scleral icterus, oropharynx clear Cardiac: RRR, no rubs, murmurs or gallops Pulm: clear to auscultation bilaterally, no wheezes, rales, or rhonchi Abd: soft, nontender, nondistended, BS present Ext: warm and well perfused, no pedal edema Neuro: responds to questions appropriately; moving all extremities freely   Lab Results: Basic Metabolic Panel:  Recent Labs Lab 08/29/15 0301 08/30/15 0231  NA 137 135  K 4.6 4.4  CL 101 101  CO2 26 22  GLUCOSE 139* 197*  BUN 24* 19  CREATININE 1.34* 1.14  CALCIUM 8.9 8.4*   CBC:  Recent Labs Lab 08/28/15 0539  WBC 9.2  NEUTROABS 6.4  HGB 13.0  HCT 40.3  MCV 84.3  PLT 195   CBG:  Recent Labs Lab 08/28/15 1710 08/28/15 2116 08/29/15 0530 08/29/15 1152 08/29/15 1619 08/29/15 2053  GLUCAP 377* 238* 138* 207* 212* 250*   Micro Results: No results found for this or any previous visit (from the past 240 hour(s)). Studies/Results: Dg Chest Port 1 View  08/29/2015  CLINICAL DATA:  Acute respiratory failure.  Wheezing. EXAM: PORTABLE CHEST 1 VIEW COMPARISON:  08/28/2015 and prior exams FINDINGS:  Upper limits normal heart size and mild peribronchial thickening again noted. There is no evidence of focal airspace disease, pulmonary edema, suspicious pulmonary nodule/mass, pleural effusion, or pneumothorax. No acute bony abnormalities are identified. IMPRESSION: No active disease. Electronically Signed   By: Margarette Canada M.D.   On: 08/29/2015 15:11   Medications: I have reviewed the patient's current medications. Scheduled Meds: . budesonide (PULMICORT) nebulizer solution  0.25 mg Nebulization BID  . enoxaparin (LOVENOX) injection  90 mg Subcutaneous Q24H  . insulin aspart  0-20 Units Subcutaneous TID WC  . ipratropium-albuterol  3 mL Nebulization Q4H  . oseltamivir  75 mg Oral BID  . pravastatin  40 mg Oral q1800  . sodium chloride flush  3 mL Intravenous Q12H   Continuous Infusions:  PRN Meds:.acetaminophen **OR** acetaminophen, albuterol, guaiFENesin-dextromethorphan, senna-docusate, sodium chloride, zolpidem Assessment/Plan:  Mr. Gayden is a 60 year old gentleman with multiple medical comorbidities hospitalized with influenza A.  Influenza A: Tested positive on admission. Now with increased oxygen requirement on 4L by nasal cannula though will need to be assessed while ambulating. Given his other comorbidities, I suspect he has reduced physiological reserve which is what is going to lead to an anticipated prolonged stay. -Started also Tamiflu 75 mg twice daily 5 days though suspect benefit will be limited -  Consult physical therapy to assess his ability to ambulate with oxygen -Continue DuoNeb's every 4 hours -Continue budesonide twice daily -Continue guaifenesin-dextromethorphan every 4 hours as needed for cough -Continue acetaminophen 650 mg as needed for mild pain or fever -Transfer to med/surg today  Acute kidney injury: Resolved. Creatinine appears to be downtrending from 1.4 admission to 1.1 today with baseline around 1.1. Improved with IV hydration. -Encouraged by mouth  intake today -Continue holding home torsemide and losartan  Chronic diastolic heart failure: Echo 11/14/13 with  grade 1 diastolic dysfunction and EF 45-50% thought to be secondary to obstructive sleep apnea. Role of home diuretics is questionable and may be more prudent to assess the severity of his obstructive sleep apnea which could lead to right heart strain in the long term. He is not in an acute CHF exacerbation.  Obstructive sleep apnea: Has had the same machine since 2005 without any equipment replaced for at least the past year due to cost. Polysomnogram available to [place] in Care Everywhere dated 05/04/03 notable for severe obstructive sleep apnea. -Continue home CPAP -Refer for sleep study to re-titrate his settings at discharge  Hypertension: Home medications include losartan 100 mg. Systolic blood pressures still ranging mid 90s to mid 100s. -Holding losartan as noted above  Type 2 diabetes: A1c 7.9 in November 2015. Her medications include metformin 1000 mg twice daily and glimepiride 400 mg daily. CBGs mainly in the low 100s. -Continue moderate sensitivity sliding scale insulin  Hyperlipidemia: Home medications include lovastatin 40 mg. -Continue pravastatin 40 mg per hospital formulary  Dispo: Disposition is deferred at this time, awaiting improvement of current medical problems and ability to ambulate with supplemental oxygen.  Anticipated discharge in approximately 1-2 day(s).   The patient does have a current PCP Ashok Norris, MD) and does not need an [facility] hospital follow-up appointment after discharge.  The patient does not know have transportation limitations that hinder transportation to clinic appointments.  .Services Needed at time of discharge: Y = Yes, Blank = No PT:   OT:   RN:   Equipment:   Other:       Riccardo Dubin, MD 08/30/2015, 7:07 AM

## 2015-08-30 NOTE — Progress Notes (Signed)
Inpatient Diabetes Program Recommendations  AACE/ADA: New Consensus Statement on Inpatient Glycemic Control (2015)  Target Ranges:  Prepandial:   less than 140 mg/dL      Peak postprandial:   less than 180 mg/dL (1-2 hours)      Critically ill patients:  140 - 180 mg/dL   Results for STEPAN, BRODERSON (MRN [id number]) as of 08/30/2015 13:39  Ref. Range 08/29/2015 05:30 08/29/2015 11:52 08/29/2015 16:19 08/29/2015 20:53  Glucose-Capillary Latest Ref Range: 65-99 mg/dL 138 (H) 207 (H) 212 (H) 250 (H)    Results for HIREN, VITUCCI (MRN [id number]) as of 08/30/2015 13:39  Ref. Range 08/30/2015 08:03 08/30/2015 11:59  Glucose-Capillary Latest Ref Range: 65-99 mg/dL 185 (H) 211 (H)    Admit with: Influenza A  History: DM, CHF  Home DM Meds: Amaryl 4 mg daily       Metformin 1000 mg bid  Current Insulin Orders: Novolog Resistant SSI (0-20 units) TID AC     MD- Please consider starting low dose Novolog Meal Coverage while patient's home DM oral meds are on hold-  Novolog 4 units tidwc     --Will follow patient during hospitalization--  Wyn Quaker RN, MSN, CDE Diabetes Coordinator Inpatient Glycemic Control Team Team Pager: [phone] (8a-5p)

## 2015-08-30 NOTE — Plan of Care (Signed)
Problem: Acute Rehab PT Goals(only PT should resolve) Goal: Patient Will Transfer Sit To/From Stand With no AD Goal: Pt Will Ambulate With no AD and dyspnea less than 4/10.

## 2015-08-30 NOTE — Progress Notes (Signed)
Called by RN for worsening respiratory distress. Upon arrival, patient appeared anxious though was able to answer questions appropriately. We explained to him that we suspect the increased frequency of his nebulizer treatments, particularly the albuterol, may have contributed to his worsening anxiety. Lung ausculation notable for end-expiratory wheezing albeit improved from yesterday.   We acknowledged to him that he did well with physical therapy today and that we would still be up for transferring him out of the stepdown unit to med/surg unit. We will change his nebulizer treatments to as needed.

## 2015-08-30 NOTE — Evaluation (Signed)
Physical Therapy Evaluation Patient Details Name: Michael Martinez MRN: [id number] DOB: 01-07-1956 Today's Date: 08/30/2015   History of Present Illness  Michael Martinez is a 60 year old gentleman with chronic diastolic congestive heart failure, hypertension, hyperlipidemia, morbid obesity, obstructive sleep apnea, type 2 diabetes, chronic venous insufficiency complicated by recurrent cellulitis in the right lower extremity who presents with several day history of cough, shortness of breath, wheezing.  Clinical Impression  Pt admitted with SOB and influenza. Pt presents with functional limitations due to decreased cardiorespiratory endurance and decreased balance. Pt participated well with therapy but was very SOB with ambulation (dyspnea 3.5/4 ) but SpO2 was at 93% and above on room air (see detailed vitals below). Pt states that he did not use a RW prior to admission but feels as if he needed it today due to extreme SOB. Pt would benefit from continued PT services to improve current functional limitations to be able to safely return home.     Follow Up Recommendations Home health PT;Supervision - Intermittent    Equipment Recommendations  Rolling walker with 5" wheels    Recommendations for Other Services       Precautions / Restrictions Precautions Precautions: Fall Restrictions Weight Bearing Restrictions: No      Mobility  Bed Mobility               General bed mobility comments: Pt in chair upon entry to room   Transfers Overall transfer level: Needs assistance Equipment used: Rolling walker (2 wheeled) (bariatric walker) Transfers: Sit to/from Stand Sit to Stand: Min guard (Reliant on BUE's to stand)         General transfer comment: Pt needed a lot of momentum and BUE support to stand from recliner.   Ambulation/Gait Ambulation/Gait assistance: Min guard Ambulation Distance (Feet): 250 Feet Assistive device: Rolling walker (2 wheeled) Gait Pattern/deviations:  Step-through pattern;Decreased stride length;Drifts right/left;Wide base of support Gait velocity: decreased Gait velocity interpretation: Below normal speed for age/gender General Gait Details: Very reliant on RW for ambuation. Asked pt if he wanted to try and walk without the RW but pt stated that he felt like he needed due to SOB.   Stairs            Wheelchair Mobility    Modified Rankin (Stroke Patients Only)       Balance Overall balance assessment: Needs assistance Sitting-balance support: Feet supported;No upper extremity supported Sitting balance-Leahy Scale: Good     Standing balance support: Bilateral upper extremity supported Standing balance-Leahy Scale: Fair                               Pertinent Vitals/Pain Pain Assessment: No/denies pain  Pre-Activity: HR: 109 bpm, O2 Sat: 98% on room air, RR: 23 bpm, BP: 111/64 mmHg During-Activity (ambulation): HR: 112bpm, O2 Sat: 93% on room air, RR: 34 bpm Post-Activity: HR: 102 bpm, O2 Sat: 97% on 3L [place], RR: 26 bpm     Home Living Family/patient expects to be discharged to:: Private residence Living Arrangements: Spouse/significant other Available Help at Discharge: Family;Other (Comment) (States that he does not want his family exposed to the flu) Type of Home: House Home Access: Stairs to enter Entrance Stairs-Rails: Left Entrance Stairs-Number of Steps: 3 Home Layout: One level Home Equipment: Walker - standard;Bedside commode;Cane - single point      Prior Function Level of Independence: Independent         Comments: did not  use any AD prior to admission     Hand Dominance        Extremity/Trunk Assessment   Upper Extremity Assessment: Overall WFL for tasks assessed           Lower Extremity Assessment: Overall WFL for tasks assessed      Cervical / Trunk Assessment: Kyphotic  Communication   Communication: No difficulties  Cognition Arousal/Alertness:  Awake/alert Behavior During Therapy: WFL for tasks assessed/performed Overall Cognitive Status: Within Functional Limits for tasks assessed                      General Comments General comments (skin integrity, edema, etc.): Pt very SOB with ambulation (dyspnea 3.5/4) but SpO2 at 93% or above on room air with ambuation. Pt had several non-productive coughs at the end of activity.     Exercises        Assessment/Plan    PT Assessment Patient needs continued PT services  PT Diagnosis Difficulty walking;Abnormality of gait;Other (comment) (Decreased cardiopulmonary endurance. )   PT Problem List Decreased activity tolerance;Decreased balance;Decreased mobility;Cardiopulmonary status limiting activity;Obesity  PT Treatment Interventions Gait training;Stair training;Functional mobility training;Therapeutic activities;Therapeutic exercise;Balance training;Patient/family education   PT Goals (Current goals can be found in the Care Plan section) Acute Rehab PT Goals Patient Stated Goal: To not feel so SOB with activity.  PT Goal Formulation: With patient Time For Goal Achievement: 09/13/15 Potential to Achieve Goals: Good    Frequency Min 3X/week   Barriers to discharge Decreased caregiver support Pt states that his wife is home, but has the Flu herself so he is not sure how much she will be able to help. Pt states that he has a daughter but does not want her to be exposed to the flu because she has small children. Pt may be safe to return home with rolling walker.     Co-evaluation               End of Session Equipment Utilized During Treatment: Gait belt Activity Tolerance: Patient tolerated treatment well Patient left: in chair;with call bell/phone within reach Nurse Communication: Mobility status;Other (comment) (O2 Sats okay with activity. )    Functional Assessment Tool Used: clinical judgment Functional Limitation: Mobility: Walking and moving  around Mobility: Walking and Moving Around Current Status [id number]): At least 1 percent but less than 20 percent impaired, limited or restricted Mobility: Walking and Moving Around Goal Status [phone]): 0 percent impaired, limited or restricted    Time: [id number] PT Time Calculation (min) (ACUTE ONLY): 21 min   Charges:   PT Evaluation $PT Eval Moderate Complexity: 1 Procedure     PT G Codes:   PT G-Codes **NOT FOR INPATIENT CLASS** Functional Assessment Tool Used: clinical judgment Functional Limitation: Mobility: Walking and moving around Mobility: Walking and Moving Around Current Status [id number]): At least 1 percent but less than 20 percent impaired, limited or restricted Mobility: Walking and Moving Around Goal Status [phone]): 0 percent impaired, limited or restricted    Colon Branch, SPT Colon Branch 08/30/2015, 1:57 PM I have read, reviewed and agree with student's note.   [place] [phone] (pager)

## 2015-08-30 NOTE — Progress Notes (Signed)
Subjective: Overnight events: Called to the bedside as patient was having to work harder to breath after a breathing treatment.  Patient was noted to be working hard to breath, wheezing throughout and getting tired.  ABG was normal, repeat chest xr was unchanged.  Patient moved to stepdown for closer monitoring and increased frequency of breathing treatments.   Patient reports that he is feeling a little better since yesterday.  Be thinks that the increased frequency of breathing treatment has been helpful. He expressed concern that on measurement of weight today, he was 19 lbs down from his last weight at his PCP's office 4 days ago on 08/26/15.  He reports poor appetite but has been drinking as much water as he can get.  Patient reports that his wife has been unable to visit recently because she is sick with the flu.  Tamiflu was called in for her by her PCP.   Objective: Vital signs in last 24 hours: Filed Vitals:   08/30/15 0058 08/30/15 0316 08/30/15 0402 08/30/15 0500  BP: 96/81  124/76   Pulse:      Temp: 99 F (37.2 C)  98.7 F (37.1 C)   TempSrc: Axillary  Axillary   Resp: 24  22   Height:      Weight:    180.033 kg (396 lb 14.4 oz)  SpO2: 99% 100% 100%    Weight change: -0.641 kg (-1 lb 6.6 oz)  Intake/Output Summary (Last 24 hours) at 08/30/15 0806 Last data filed at 08/29/15 2100  Gross per 24 hour  Intake    360 ml  Output      0 ml  Net    360 ml   BP 124/76 mmHg  Pulse 108  Temp(Src) 98.7 F (37.1 C) (Axillary)  Resp 22  Ht 6' (1.829 m)  Wt 180.033 kg (396 lb 14.4 oz)  BMI 53.82 kg/m2  SpO2 100%  General Appearance: Obese male, sitting up in hospital chair on oxygen  By [place], alert, cooperative Head: Normocephalic, without obvious abnormality, atraumatic Eyes:PERRL, conjunctiva/corneas clear, EOM's intact Nose: Nares normal, septum midline, mucosa normal, no drainage  Throat: Lips, mucosa, and tongue normal; teeth and gums normal Neck: Supple, symmetrical,  trachea midline, no JVD Back: Symmetric, no curvature, ROM normal Lungs: Expiratory wheezing present in all lung fields, slightly increased work of breathing, speaks in full sentances Chest Wall: No tenderness or deformity Heart: Tachycardic with occasional extra beat, S1 and S2 normal, no murmur, rub or gallop Abdomen: Obese, soft, non-tender, bowel sounds present, no masses, no organomegaly Extremities: Extremities normal, atraumatic, no cyanosis or edema Pulses: 2+ and symmetric in all extremities Neuro: No focal neurological deficit, moves all extremities equally   Lab Results: Basic Metabolic Panel:  Recent Labs Lab 08/29/15 0301 08/30/15 0231  NA 137 135  K 4.6 4.4  CL 101 101  CO2 26 22  GLUCOSE 139* 197*  BUN 24* 19  CREATININE 1.34* 1.14  CALCIUM 8.9 8.4*   CBC:  Recent Labs Lab 08/28/15 0539  WBC 9.2  NEUTROABS 6.4  HGB 13.0  HCT 40.3  MCV 84.3  PLT 195   CBG:  Recent Labs Lab 08/28/15 1710 08/28/15 2116 08/29/15 0530 08/29/15 1152 08/29/15 1619 08/29/15 2053  GLUCAP 377* 238* 138* 207* 212* 250*   Misc. Labs: Influenza A: positive Influenza B: negative H1N1: Negative  Studies/Results: Dg Chest Port 1 View  08/29/2015  CLINICAL DATA:  Acute respiratory failure.  Wheezing. EXAM: PORTABLE CHEST 1 VIEW COMPARISON:  08/28/2015 and prior exams FINDINGS: Upper limits normal heart size and mild peribronchial thickening again noted. There is no evidence of focal airspace disease, pulmonary edema, suspicious pulmonary nodule/mass, pleural effusion, or pneumothorax. No acute bony abnormalities are identified. IMPRESSION: No active disease. Electronically Signed   By: Margarette Canada M.D.   On: 08/29/2015 15:11   Medications: I have reviewed the patient's current medications. Scheduled Meds: . budesonide (PULMICORT) nebulizer solution  0.25 mg Nebulization BID  . enoxaparin (LOVENOX) injection  90 mg Subcutaneous Q24H  . insulin aspart  0-20 Units  Subcutaneous TID WC  . ipratropium-albuterol  3 mL Nebulization Q4H  . oseltamivir  75 mg Oral BID  . pravastatin  40 mg Oral q1800  . sodium chloride flush  3 mL Intravenous Q12H   Continuous Infusions:  PRN Meds:.acetaminophen **OR** acetaminophen, albuterol, guaiFENesin-dextromethorphan, senna-docusate, sodium chloride, zolpidem Assessment/Plan: Active Problems:   Shortness of breath   Influenza   Michael Martinez is a 60 year old male with history of diabetes mellitus, hypertension, DVT, OSA, and diastolic HF who presents with worsening shortness of breath and productive cough for 7 days in the setting of recent completion of zpack treatment and started prednisone as an outpatient who was found to have increased oxygen requirement, temperature of 100.85F, and CXR showing vascular congestion and minimal bibasilar atelectasis.   #Influenza A: Patient tested positive for influenza A. Patient was started on oseltamivir 75 mg PO BID. Patient states that he did have a flu shot this year. Patient is currently on 4L [place]. Transferred to stepdown yesterday.  Patient will likely require close monitoring given low reserve secondary to body habitus. Plan to evaluate mobility and O2 requirement with PT today.  - Continue oseltamivir 75 mg PO BID for 5 days (last dose 09/03/15 in the am) - Continue supplemental O2 to maintain sats about 92% - Continue acetaminophen 650 mg prn mild pain or fever - Continue albuterol 3 ml nebulizer Q6H prn wheezing, SOB - Continue ipratropium-albuterol 0.5-2.5 mg/22mL nebulizer Q6H - Continue guaifenesin-dextromethorphan 100-10 mg/5 ml PO Q4H prn cough - Consult for PT eval today - Transfer to floor   #Elevated Cr: Patient had an elevated Cr on admission of 1.41. Back to baseline today at 1.14. Baseline from 1 year ago is 1.16. Likely secondary to dehydration in the setting of 1 week of URI symptoms and home torsemide.  - Encourage PO hydration - Hold home torsemide -  Recheck BMP in the am   #History of Diastolic Heart Failure: Last ECHO (11/14/13) showed EF 45-50% and grade 1 diastolic dysfunction. Notes by Dr. Rockey Situ on that ECHO state that depressed EF could be secondary to OSA, unable to exclude ischemia. If symptoms did not improve with diuretic, would consider stress myoview. At this time, patient does not appear to have fluid overload on physical exam. Patient takes torsemide 20 mg PO once to twice daily at home. Patient also wears compression stockings and has found this to be helpful.  - Hold home torsemide in setting acute illness and decreased PO intake  #History of Obstructive Sleep Apnea: Patient has home CPAP machine with him. He reports that he has had the same machine since 2005 and has not gotten replacement filters in at least 1 year.  - Continue home CPAP  #History of HTN: Home medication is losartan 100 mg PO daily. BP was lower end of normal upon admission. Blood pressures improved overnight. In the setting of acute flu and likely being fluid down based  on serum Cr, will continue to hold losartan.  - Continue to hold home htn medication  #History of Diabetes: Elevated CBG upon admission, likely due to recent PO prednisone use. Patient takes metformin 1000mg  PO BID and glimepiride 4 mg PO daily at home for his DM2. Last A1c 3 months ago was 7.9. Required 11 U SSI yesterday.  - Hold home DM medications - Continue insulin aspart (novolog) sliding scale with meals - Continue insulin aspart (novolog) 0-5 units QHS   #History of Hypercholesterolemia: Patient takes lovastatin 40 mg at bedtime at home. - Continue pravastatin 40 mg PO daily per hospital formulary   FENGI/Ppx::  Fluids: Encourage PO hydration Electrolytes: no need to replace Nutrition: Diet carb modified  GI: senna-docusate qhs Ppx: Enoxaparin for dvt prophylaxis  Dispo: Transfer to floor today.  PT  evaluation.  Continue to monitor O2 requirement.  This is a Careers information officer Note.  The care of the patient was discussed with Dr. Charlott Rakes and the assessment and plan formulated with their assistance.  Please see their attached note for official documentation of the daily encounter.     Michael Martinez, Med Student 08/30/2015, 8:06 AM

## 2015-08-31 LAB — BASIC METABOLIC PANEL
ANION GAP: 13 (ref 5–15)
BUN: 14 mg/dL (ref 6–20)
CALCIUM: 8.5 mg/dL — AB (ref 8.9–10.3)
CO2: 22 mmol/L (ref 22–32)
Chloride: 99 mmol/L — ABNORMAL LOW (ref 101–111)
Creatinine, Ser: 1.13 mg/dL (ref 0.61–1.24)
GFR calc Af Amer: >60 mL/min (ref >60)
GLUCOSE: 200 mg/dL — AB (ref 65–99)
Potassium: 4.3 mmol/L (ref 3.5–5.1)
SODIUM: 134 mmol/L — AB (ref 135–145)

## 2015-08-31 LAB — GLUCOSE, CAPILLARY
GLUCOSE-CAPILLARY: 197 mg/dL — AB (ref 65–99)
Glucose-Capillary: 198 mg/dL — ABNORMAL HIGH (ref 65–99)

## 2015-08-31 MED ORDER — AEROCHAMBER PLUS FLO-VU LARGE MISC: Status: DC

## 2015-08-31 MED ORDER — ALBUTEROL SULFATE HFA 108 (90 BASE) MCG/ACT IN AERS: 2.0000 | INHALATION_SPRAY | Freq: Four times a day (QID) | RESPIRATORY_TRACT | Status: DC | PRN

## 2015-08-31 MED ORDER — OSELTAMIVIR PHOSPHATE 75 MG PO CAPS: 75.0000 mg | ORAL_CAPSULE | Freq: Two times a day (BID) | ORAL | Status: DC

## 2015-08-31 MED ORDER — ALBUTEROL SULFATE (2.5 MG/3ML) 0.083% IN NEBU: 2.5000 mg | INHALATION_SOLUTION | Freq: Four times a day (QID) | RESPIRATORY_TRACT | Status: DC | PRN

## 2015-08-31 MED ORDER — IBUPROFEN 200 MG PO TABS: 400.0000 mg | ORAL_TABLET | Freq: Four times a day (QID) | ORAL | Status: DC | PRN

## 2015-08-31 NOTE — Discharge Summary (Signed)
Name: DEMARRION Martinez MRN: [id number] DOB: Sep 17, 1955 60 y.o. PCP: Michael Norris, MD  Date of Admission: 08/28/2015  5:00 AM Date of Discharge: 08/31/15 Attending Physician: Dr. Henreitta Martinez  Discharge Diagnosis:  Influenza A Acute kidney injury Chronic diastolic heart failure Obstructive sleep apnea Type 2 diabetes Hyperlipidemia   Discharge Medications:   Medication List    STOP taking these medications        acetaminophen 500 MG tablet  Commonly known as:  TYLENOL     albuterol 108 (90 Base) MCG/ACT inhaler  Commonly known as:  PROVENTIL HFA  Replaced by:  albuterol (2.5 MG/3ML) 0.083% nebulizer solution     azithromycin 250 MG tablet  Commonly known as:  ZITHROMAX     diazepam 5 MG tablet  Commonly known as:  VALIUM     indomethacin 25 MG capsule  Commonly known as:  INDOCIN     mupirocin ointment 2 %  Commonly known as:  BACTROBAN     naproxen 500 MG tablet  Commonly known as:  NAPROSYN     predniSONE 10 MG tablet  Commonly known as:  DELTASONE     torsemide 20 MG tablet  Commonly known as:  DEMADEX     vardenafil 20 MG tablet  Commonly known as:  LEVITRA      TAKE these medications        AEROCHAMBER PLUS FLO-VU LARGE Misc  Use spacer when using albuterol.     albuterol (2.5 MG/3ML) 0.083% nebulizer solution  Commonly known as:  PROVENTIL  Take 3 mLs (2.5 mg total) by nebulization every 6 (six) hours as needed for wheezing or shortness of breath.     glimepiride 4 MG tablet  Commonly known as:  AMARYL  Take 1 tablet (4 mg total) by mouth daily.     guaiFENesin-codeine 100-10 MG/5ML syrup  Commonly known as:  CHERATUSSIN AC  Take 10 mLs by mouth 4 (four) times daily as needed for cough.     HYDROcodone-acetaminophen 5-325 MG tablet  Commonly known as:  NORCO/VICODIN  Take 1 tablet by mouth every 8 (eight) hours as needed.     ibuprofen 200 MG tablet  Commonly known as:  ADVIL,MOTRIN  Take 2 tablets (400 mg total) by mouth every 6  (six) hours as needed for moderate pain (gout).     losartan 100 MG tablet  Commonly known as:  COZAAR  Take 1 tablet (100 mg total) by mouth daily.     lovastatin 40 MG tablet  Commonly known as:  MEVACOR  Take 1 tablet (40 mg total) by mouth at bedtime.     metFORMIN 1000 MG tablet  Commonly known as:  GLUCOPHAGE  Take 1 tablet (1,000 mg total) by mouth 2 (two) times daily.     oseltamivir 75 MG capsule  Commonly known as:  TAMIFLU  Take 1 capsule (75 mg total) by mouth 2 (two) times daily.     valACYclovir 500 MG tablet  Commonly known as:  VALTREX  Take 1 tablet (500 mg total) by mouth 2 (two) times daily as needed (for fever blisters). For 5 days        Disposition and follow-up:   Michael Martinez was discharged from [facility] in Stable condition.  At the hospital follow up visit please address:  Resolution of influenza  Advance directives: poor outcome of CPR/intubation to achieve what he enjoys should he necessitate them [see discussion below]  Role for torsemide in diastolic  CHF and chronic venous insufficiency  OSA: repeating sleep study as equipment is old   Follow-up Appointments:     Follow-up Information    Go to Michael Norris, MD.   Specialty:  Family Medicine   Why:  Go to your previously scheduled appoitnments at [facility] for hospital followup   Contact information:   [address] [place] [address] [phone]       Discharge Instructions: Discharge Instructions    Call MD for:  persistant dizziness or light-headedness    Complete by:  As directed      Diet - low sodium heart healthy    Complete by:  As directed      Increase activity slowly    Complete by:  As directed            Consultations:    Procedures Performed:  Dg Chest 2 View  08/28/2015  CLINICAL DATA:  Acute onset of shortness of breath, wheezing, cough, congestion and fever. Initial encounter. EXAM: CHEST  2  VIEW COMPARISON:  Chest radiograph performed 08/26/2015 FINDINGS: The lungs are well-aerated. Vascular congestion is noted, with minimal bibasilar atelectasis. There is no evidence of pleural effusion or pneumothorax. The heart is normal in size; the mediastinal contour is within normal limits. No acute osseous abnormalities are seen. IMPRESSION: Vascular congestion noted, with minimal bibasilar atelectasis. Electronically Signed   By: Michael Martinez M.D.   On: 08/28/2015 06:08   Dg Chest 2 View  08/26/2015  CLINICAL DATA:  Cough for 2 days EXAM: CHEST  2 VIEW COMPARISON:  Nov 28, 2013 FINDINGS: Lungs are clear. Heart size and pulmonary vascularity are normal. No adenopathy. No bone lesions. IMPRESSION: No edema or consolidation. Electronically Signed   By: Michael Martinez M.D.   On: 08/26/2015 14:12   Dg Chest Port 1 View  08/29/2015  CLINICAL DATA:  Acute respiratory failure.  Wheezing. EXAM: PORTABLE CHEST 1 VIEW COMPARISON:  08/28/2015 and prior exams FINDINGS: Upper limits normal heart size and mild peribronchial thickening again noted. There is no evidence of focal airspace disease, pulmonary edema, suspicious pulmonary nodule/mass, pleural effusion, or pneumothorax. No acute bony abnormalities are identified. IMPRESSION: No active disease. Electronically Signed   By: Michael Martinez M.D.   On: 08/29/2015 15:11   Admission HPI: Michael Martinez is a 60 year old gentleman with chronic diastolic congestive heart failure, hypertension, hyperlipidemia, morbid obesity, obstructive sleep apnea, type 2 diabetes, chronic venous insufficiency complicated by recurrent cellulitis in the right lower extremity who presents to the emergency department today with several day history of cough, shortness of breath, wheezing. His wife Michael Martinez was present and also contributed to the interview.  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 He went to  the beach with his wife but continued to feel worse and called his PCP and was seen by one of the PCPs colleague 3 days ago. Chest x-ray was obtained that showed no acute process, and he was given prednisone, Z-Pak, cough medicine, albuterol inhaler for acute viral bronchitis. Other than a fever documented at home to be 101.9 Fahrenheit, he denied any abdominal pain, nausea, vomiting, chest pain, for apnea, dizziness, diarrhea. He has not had a bowel movement during this interval as well. He reports taking all his medications at this time, including torsemide 20 mg, for chronic venous insufficiency and losartan 100 mg, for hypertension.  In the ED, he was given  continuous albuterol nebulizer therapy and Solu-Medrol 125 g IV. Chest x-ray showed increased vascular congestion, and he was called to be admitted as he was still requiring oxygen for to maintain normal saturations.  Hospital Course by problem list:  Influenza A: On the day of admission, he tested positive for Influenza A.and was started Tamiflu 75 mg by mouth twice daily. He improved with scheduled bronchodilators [ipratropium and albuterol) along with twice daily scheduled intrapulmonary steroids [budesonide]. Subsequent checks x-rays and ABGs throughout his admission did not show any signs of acute hypoxic respiratory failure. He was evaluated by physical therapy and was able to maintain oxygen saturation of 83% without any supplemental oxygen while ambulating. At the time of discharge, Michael Martinez was discharged onTamiflu 75 mg by mouth twice daily until the morning of 09/03/15 to complete the 5 day course along with an albuterol inhaler. Consider discussing the role of interventions such as CPR and intubation at follow-up in that scenarios which may warrant their need may result in untoward outcomes given his prolonged hospital stay and other co-morbidities.  Acute Kidney Injury:  Likely prerenal given poor oral intake in the days preceding his  admission along with concomitant use of torsemide. Michael Martinez was found to have an elevated serum creatinine of 1.4 upon admission which downtrended to 1.1 with IV fluids and resumption of oral intake. Torsemide was held at the time of discharge and should be reassessed for its indications as it is not indicated for chronic diastolic heart failure or chronic venous insufficiency.   Chronic Diastolic Heart Failure:  Last echo on 11/28/13 showed grade 1 diastolic dysfunction with EF 45-50%.  No evidence of acute exacerbation of CHF during this admission. Role of torsemide should be reassessed as noted above.   Obstructive Sleep Apnea: Michael Martinez brought home CPAP with him.  Patient used his CPAP nightly during this admission.  He reports that he has had the same machine since 2005 and no longer has replacement equipment available. Consider repeating sleep study to re-evaluate CPAP settings as an outpatient.   Type 2 Diabetes Mellitus:  Michael Martinez glucose was elevated upon admission likely due to prednisone taper he received as an outpatient as well as IV solumedrol given in the emergency department. He was continued on sliding scale insulin during this admission with CBGs trending in the mid to high 100s. Home medications were resumed at discharge.  Hypercholesterolemia: Patient takes lovastatin 40 mg nightly at home.  Per hospital formulary, patient took an equivalent dose of pravastatin 40 mg by mouth daily.  Upon discharge, patient to restart home lovastatin.   Discharge Vitals:   BP 131/80 mmHg  Pulse 98  Temp(Src) 97.8 F (36.6 C) (Oral)  Resp 20  Ht 6' (1.829 m)  Wt 396 lb 14.4 oz (180.033 kg)  BMI 53.82 kg/m2  SpO2 92%  Discharge Labs:  Results for orders placed or performed during the hospital encounter of 08/28/15 (from the past 24 hour(s))  Glucose, capillary     Status: Abnormal   Collection Time: 08/31/15 11:52 AM  Result Value Ref Range   Glucose-Capillary 197 (H) 65 - 99  mg/dL    Signed: Riccardo Dubin, MD 09/01/2015, 8:40 AM    Services Ordered on Discharge: None Equipment Ordered on Discharge: None

## 2015-08-31 NOTE — Progress Notes (Signed)
Patient has refused to wear his CPAP tonight. He says he feels too congested to wear mask. RT will continue to monitor as needed.

## 2015-08-31 NOTE — Progress Notes (Signed)
Subjective: Michael Martinez is breathing more comfortably this morning.  He reports that he is ready to go home.  Did not wear home CPAP overnight due to congestion, but this morning feels well with good appetite.   Objective: Vital signs in last 24 hours: Filed Vitals:   08/30/15 2346 08/31/15 0411 08/31/15 0456 08/31/15 1005  BP:   137/75   Pulse: 102 108 105   Temp:   97.5 F (36.4 C)   TempSrc:   Oral   Resp: 22 20 21    Height:      Weight:      SpO2: 96% 97% 97% 97%   Weight change:   Intake/Output Summary (Last 24 hours) at 08/31/15 1131 Last data filed at 08/31/15 0600  Gross per 24 hour  Intake    660 ml  Output    775 ml  Net   -115 ml   General Appearance: Morbidly obese man, sitting up in hospital recliner eating breakfast, alert, cooperative, no distress, appears stated age Head: Normocephalic, without obvious abnormality, atraumatic Nose: Nares normal, septum midline, mucosa normal, no drainage Neck: Supple, symmetrical, trachea midline, no JVD Back: Symmetric, ROM normal,  Lungs: Minimal diffuse wheezing, speaking clearly in full sentances, respirations unlabored Chest Wall: No tenderness or deformity Heart: Regular rate and rhythm, S1 and S2 normal, no murmur, rub  or gallop Abdomen: Obese, Soft, non-tender, bowel sounds active all four quadrants,no masses, no organomegaly Extremities: Extremities normal, atraumatic, no cyanosis or edema Pulses: 2+ and symmetric in all extremities Neuro: alert and oriented x4,  No focal neurological deficits  Lab Results: Basic Metabolic Panel:  Recent Labs  08/30/15 0231 08/31/15 0731  NA 135 134*  K 4.4 4.3  CL 101 99*  CO2 22 22  GLUCOSE 197* 200*  BUN 19 14  CREATININE 1.14 1.13  CALCIUM 8.4* 8.5*   CBG:  Recent Labs  08/29/15 2053 08/30/15 0803 08/30/15 1159 08/30/15 1728 08/30/15 2100 08/31/15 0756  GLUCAP 250* 185* 211* 178* 190* 198*   Misc. Labs: Influenza A: positive   Micro  Results: Recent Results (from the past 240 hour(s))  MRSA PCR Screening     Status: None   Collection Time: 08/30/15  4:13 PM  Result Value Ref Range Status   MRSA by PCR NEGATIVE NEGATIVE Final    Comment:        The GeneXpert MRSA Assay (FDA approved for NASAL specimens only), is one component of a comprehensive MRSA colonization surveillance program. It is not intended to diagnose MRSA infection nor to guide or monitor treatment for MRSA infections.    Studies/Results: Dg Chest Port 1 View  08/29/2015  CLINICAL DATA:  Acute respiratory failure.  Wheezing. EXAM: PORTABLE CHEST 1 VIEW COMPARISON:  08/28/2015 and prior exams FINDINGS: Upper limits normal heart size and mild peribronchial thickening again noted. There is no evidence of focal airspace disease, pulmonary edema, suspicious pulmonary nodule/mass, pleural effusion, or pneumothorax. No acute bony abnormalities are identified. IMPRESSION: No active disease. Electronically Signed   By: Michael Martinez M.D.   On: 08/29/2015 15:11   Medications: I have reviewed the patient's current medications. Scheduled Meds: . budesonide (PULMICORT) nebulizer solution  0.25 mg Nebulization BID  . enoxaparin (LOVENOX) injection  90 mg Subcutaneous Q24H  . insulin aspart  0-20 Units Subcutaneous TID WC  . ipratropium-albuterol  3 mL Nebulization Q4H  . oseltamivir  75 mg Oral BID  . pravastatin  40 mg Oral q1800  . sodium chloride flush  3 mL Intravenous Q12H  . valACYclovir  500 mg Oral BID   Continuous Infusions:  PRN Meds:.acetaminophen **OR** acetaminophen, guaiFENesin-dextromethorphan, ipratropium-albuterol, senna-docusate, sodium chloride, zolpidem Assessment/Plan: Active Problems:   Influenza   Shortness of breath  Michael Martinez is a 60 year old male with history of diabetes mellitus, hypertension, DVT, OSA, and diastolic HF who presents with worsening shortness of breath and productive cough for 7 days in the setting of recent  completion of zpack treatment and started prednisone as an outpatient who was found to have increased oxygen requirement, temperature of 100.83F, and CXR showing vascular congestion and minimal bibasilar atelectasis.   #Influenza A: Patient tested positive for influenza A. Patient was started on oseltamivir 75 mg PO BID. Patient states that he did have a flu shot this year. Patient is feeling much better today and would like to go home.   - Start albuterol MDI with spacer Q4-6H prn for wheezing Continue albuterol 3 ml nebulizer Q6H prn wheezing, SOB - Continue oseltamivir 75 mg PO BID for 5 days (last dose 09/03/15 in the am) - Continue acetaminophen 650 mg prn mild pain or fever - Discontinue ipratropium-albuterol 0.5-2.5 mg/9mL nebulizer Q6H - Discontinue supplemental O2 to maintain sats about 92% - Discontinue guaifenesin-dextromethorphan 100-10 mg/5 ml PO Q4H prn cough   #Elevated Cr: Patient had an elevated Cr on admission of 1.41. Back to baseline at 1.14. Baseline from 1 year ago is 1.16. Likely secondary to dehydration in the setting of 1 week of URI symptoms and home torsemide.  - Encourage PO hydration - Continue to hold home torsemide   #History of Diastolic Heart Failure: Last ECHO (11/14/13) showed EF 45-50% and grade 1 diastolic dysfunction. Notes by Dr. Rockey Situ on that ECHO state that depressed EF could be secondary to OSA, unable to exclude ischemia. If symptoms did not improve with diuretic, would consider stress myoview. At this time, patient does not appear to have fluid overload on physical exam. Patient takes torsemide 20 mg PO once to twice daily at home. Patient also wears compression stockings and has found this to be helpful.  - Continue to hold home torsemide in setting acute illness and decreased PO intake  #History of Obstructive Sleep Apnea: Patient has home CPAP machine with him. He reports that he has had the same machine since 2005 and has not gotten  replacement filters in at least 1 year.  - Continue home CPAP - Evaluation as an outpatient  #History of HTN: Home medication is losartan 100 mg PO daily. BP was lower end of normal upon admission. In the setting of acute flu and likely being fluid down based on serum Cr, losartan was held. - Restart home losartan upon discharge.   #History of Diabetes: Elevated CBG upon admission, likely due to recent PO prednisone use. Patient takes metformin 1000mg  PO BID and glimepiride 4 mg PO daily at home for his DM2. Last A1c 3 months ago was 7.9.  - Upon discharge, patient to restart home DM regimen.  #History of Hypercholesterolemia: Patient takes lovastatin 40 mg at bedtime at home. Pravastatin 40 mg PO daily per hospital formulary while admitted.   - Upon discharge, patient to restart home lovastatin.    FENGI/Ppx::  Fluids: Encourage PO hydration Electrolytes: no need to replace Nutrition: Diet carb modified  GI: senna-docusate qhs Ppx: Enoxaparin for dvt prophylaxis  Dispo: Discharge today.  This is a Careers information officer Note.  The care of the patient was discussed with Dr. Duwaine Maxin and  the assessment and plan formulated with their assistance.  Please see their attached note for official documentation of the daily encounter.     Lovina Reach, Med Student 08/31/2015, 11:31 AM

## 2015-08-31 NOTE — Progress Notes (Signed)
Patient Discharge: Disposition: Patient discharged to home. Education: Reviewed medications, prescriptions, follow-up appointments, and discharge instructions, understood and acknowledged. IV: Discontinued IV before discharge. Telemetry: N/A Transportation: Patient transported in w/c accompanied by the staff out of the unit. Belongings: patient took all his belongings with him.

## 2015-08-31 NOTE — Discharge Instructions (Signed)
You were admitted to [facility] for the flu (influenza A virus). You were started on Tamiflu while you were admitted to the hospital.  Continue to take Tamiflu 75 mg twice daily until 09/03/15.  Your last dose of Tamiflu will be in the morning on 09/03/15.  You can continue to use albuterol inhaler at home every 4 to 6 hours as needed for wheezing. Do not restart your home torsemide (fluid pill) while you continue to have the flu.    The physical therapy team reccommended that you use a rolling walker at home while you are regaining strength.   Keep your upcoming appointment at your primary care doctor's office later this month.    When you see Dr. Rutherford Nail in March, ask about sleep study for obstructive sleep apnea as you have had the same machine ( an unable to find filters to fit) and settings since 2005.    _______________________________________________________________________________________________________ Influenza:   Flu symptoms usually improve over two to five days, although the illness may last for a week or more. Weakness and fatigue may persist for several weeks   Treat symptoms -- Treating the symptoms of influenza can help you to feel better but will not make the flu go away faster.  --Rest until the flu is fully resolved, especially if the illness has been severe.  --Fluids: Drink enough fluids so that you do not become dehydrated.  --Acetaminophen (sample brand name: Tylenol) can relieve fever, headache, and muscle aches.  --Cough medicines are not usually helpful; cough usually resolves without treatment.   --Antibiotics -- Antibiotics are NOT useful for treating viral illnesses such as influenza. Antibiotics should only be used if there is a bacterial complication of the flu such as bacterial pneumonia, ear infection, or sinusitis. Antibiotics can cause side effects and lead to development of antibiotic resistance.

## 2015-08-31 NOTE — Progress Notes (Signed)
   Subjective: Michael Martinez was seen and examined this AM.  He would like to know if he will be able to go home today.  Objective: Vital signs in last 24 hours: Filed Vitals:   08/31/15 0456 08/31/15 1000 08/31/15 1005 08/31/15 1334  BP: 137/75 131/80    Pulse: 105 98    Temp: 97.5 F (36.4 C) 97.8 F (36.6 C)    TempSrc: Oral Oral    Resp: 21 20    Height:      Weight:      SpO2: 97% 98% 97% 92%   Weight change:   Intake/Output Summary (Last 24 hours) at 08/31/15 1703 Last data filed at 08/31/15 1100  Gross per 24 hour  Intake    660 ml  Output    775 ml  Net   -115 ml   General: sitting up in chair HEENT: [place]/AT Cardiac: RRR, no rubs, murmurs or gallops Pulm: mild wheezes, moving normal volumes of air Abd: soft, nontender, nondistended, BS present Ext: warm and well perfused, no pedal edema Neuro: alert and oriented X3, responding appropriately  Lab Results: Basic Metabolic Panel:  Recent Labs Lab 08/30/15 0231 08/31/15 0731  NA 135 134*  K 4.4 4.3  CL 101 99*  CO2 22 22  GLUCOSE 197* 200*  BUN 19 14  CREATININE 1.14 1.13  CALCIUM 8.4* 8.5*   CBG:  Recent Labs Lab 08/30/15 0803 08/30/15 1159 08/30/15 1728 08/30/15 2100 08/31/15 0756 08/31/15 1152  GLUCAP 185* 211* 178* 190* 198* 197*   Medications: I have reviewed the patient's current medications. Scheduled Meds: . budesonide (PULMICORT) nebulizer solution  0.25 mg Nebulization BID  . enoxaparin (LOVENOX) injection  90 mg Subcutaneous Q24H  . insulin aspart  0-20 Units Subcutaneous TID WC  . ipratropium-albuterol  3 mL Nebulization Q4H  . oseltamivir  75 mg Oral BID  . pravastatin  40 mg Oral q1800  . sodium chloride flush  3 mL Intravenous Q12H  . valACYclovir  500 mg Oral BID   Continuous Infusions: none PRN Meds:.acetaminophen **OR** acetaminophen, guaiFENesin-dextromethorphan, ipratropium-albuterol, senna-docusate, sodium chloride, zolpidem   Assessment/Plan: 60 year old male with  PMH of HTN, DM, OSA, dHF, hx of DVT here with flu.  Flu:  On day 4 of Tamiflu.  Afebrile, on room air.  Feeling better and asking when he can go home. - stable for discharge home - continue BID Tamiflu x 5 days - he will follow-up at PCP office within 2 weeks  AKI:  Resolved.  HTN:  Stable.  Continue losartan.  DM type 2:  Resume metformin and glimepiride at d/c  Chronic diastolic HF:  EF [id number].  No evidence of volume overload.  OSA:  He has been using a very old machine and we advised him to follow-up with PCP to get equipment replaced.  Dispo: He is stable for d/c home today with close follow-up with PCP.  The patient does have a current PCP Ashok Norris, MD) and does not need an [facility] hospital follow-up appointment after discharge.  The patient does not know have transportation limitations that hinder transportation to clinic appointments.  .Services Needed at time of discharge: Y = Yes, Blank = No PT:   OT:   RN:   Equipment:   Other:       Michael Oman, DO 08/31/2015, 5:03 PM

## 2015-09-06 ENCOUNTER — Ambulatory Visit: Payer: Self-pay | Admitting: Family Medicine

## 2015-09-09 ENCOUNTER — Other Ambulatory Visit: Payer: Self-pay | Admitting: Family Medicine

## 2015-09-09 ENCOUNTER — Encounter: Payer: Self-pay | Admitting: Family Medicine

## 2015-09-09 ENCOUNTER — Ambulatory Visit (INDEPENDENT_AMBULATORY_CARE_PROVIDER_SITE_OTHER): Payer: Self-pay | Admitting: Family Medicine

## 2015-09-09 VITALS — BP 132/78 | HR 98 | Temp 98.5°F | Resp 18 | Ht 72.0 in | Wt >= 6400 oz

## 2015-09-09 DIAGNOSIS — J111 Influenza due to unidentified influenza virus with other respiratory manifestations: Secondary | ICD-10-CM

## 2015-09-09 DIAGNOSIS — G4733 Obstructive sleep apnea (adult) (pediatric): Secondary | ICD-10-CM

## 2015-09-09 DIAGNOSIS — Z9989 Dependence on other enabling machines and devices: Secondary | ICD-10-CM

## 2015-09-09 DIAGNOSIS — I5032 Chronic diastolic (congestive) heart failure: Secondary | ICD-10-CM

## 2015-09-09 NOTE — Progress Notes (Signed)
Name: EDNA SPEARS   MRN: [id number]    DOB: 01/23/1956   Date:09/09/2015       Progress Note  Subjective  Chief Complaint  Chief Complaint  Patient presents with  . Follow-up    Influenza This is a new problem. The current episode started more than 1 month ago (3 weeks ago.). The problem has been resolved. Pertinent negatives include no chills, congestion, coughing, fever or sore throat. Treatments tried: Was given Tamiflu in the hospital.   Hospital D/C Summary reviewed and discussed.  CHF Pt. Has been diagnosed with CHF, follows with Dr. Rockey Situ. Last Echocardiogram in 2015 showed EF of 45-50%. He takes Torsemide for swelling in his lower extremities.  Past Medical History  Diagnosis Date  . DVT (deep venous thrombosis) ([place]) 2006    "after ankle surgery"  . Gout   . Obesity   . Hypertension   . Hypercholesteremia   . CHF (congestive heart failure) ([place])   . OSA on CPAP   . Peripheral edema     chronic  . Clotting disorder (HCC)     deep vein thrombosis left leg  . Melanoma ([place])     left ear  . Shortness of breath   . Cellulitis and abscess of lower extremity 03/24/2014    RT LEG  . Diabetes mellitus     TYPE 2  . GERD (gastroesophageal reflux disease)   . Arthritis     RA  . Bronchitis     Past Surgical History  Procedure Laterality Date  . Hernia repair    . Tonsillectomy    . Skin cancer excision      Family History  Problem Relation Age of Onset  . Stroke Mother   . Hypertension Mother   . Asthma Mother   . Hypertension Father   . Asthma Sister   . Asthma Sister   . Asthma Daughter   . Tuberculosis Mother     Social History   Social History  . Marital Status: Married    Spouse Name: N/A  . Number of Children: N/A  . Years of Education: N/A   Occupational History  . Not on file.   Social History Main Topics  . Smoking status: Never Smoker   . Smokeless tobacco: Never Used  . Alcohol Use: No  . Drug Use: No  . Sexual Activity: Not  on file   Other Topics Concern  . Not on file   Social History Narrative     Current outpatient prescriptions:  .  albuterol (PROVENTIL) (2.5 MG/3ML) 0.083% nebulizer solution, Take 3 mLs (2.5 mg total) by nebulization every 6 (six) hours as needed for wheezing or shortness of breath., Disp: 75 mL, Rfl: 12 .  glimepiride (AMARYL) 4 MG tablet, Take 1 tablet (4 mg total) by mouth daily., Disp: 30 tablet, Rfl: 3 .  guaiFENesin-codeine (CHERATUSSIN AC) 100-10 MG/5ML syrup, Take 10 mLs by mouth 4 (four) times daily as needed for cough., Disp: 200 mL, Rfl: 0 .  HYDROcodone-acetaminophen (NORCO/VICODIN) 5-325 MG tablet, Take 1 tablet by mouth every 8 (eight) hours as needed., Disp: 30 tablet, Rfl: 3 .  ibuprofen (ADVIL,MOTRIN) 200 MG tablet, Take 2 tablets (400 mg total) by mouth every 6 (six) hours as needed for moderate pain (gout)., Disp: 30 tablet, Rfl: 0 .  losartan (COZAAR) 100 MG tablet, Take 1 tablet (100 mg total) by mouth daily., Disp: 30 tablet, Rfl: 2 .  losartan (COZAAR) 100 MG tablet, TAKE 1 TABLET(100 MG)  BY MOUTH DAILY, Disp: 90 tablet, Rfl: 0 .  lovastatin (MEVACOR) 40 MG tablet, Take 1 tablet (40 mg total) by mouth at bedtime., Disp: 30 tablet, Rfl: 3 .  metFORMIN (GLUCOPHAGE) 1000 MG tablet, Take 1 tablet (1,000 mg total) by mouth 2 (two) times daily., Disp: 60 tablet, Rfl: 2 .  oseltamivir (TAMIFLU) 75 MG capsule, Take 1 capsule (75 mg total) by mouth 2 (two) times daily., Disp: 7 capsule, Rfl: 0 .  Spacer/Aero-Holding Chambers (AEROCHAMBER PLUS FLO-VU LARGE) MISC, Use spacer when using albuterol., Disp: 10 each, Rfl: 0 .  valACYclovir (VALTREX) 500 MG tablet, Take 1 tablet (500 mg total) by mouth 2 (two) times daily as needed (for fever blisters). For 5 days, Disp: 20 tablet, Rfl: 3  Allergies  Allergen Reactions  . Penicillins     Pt complained of chest tightness, pt very diaphoretic, pt chest, arms red, difficulty breathing.  Marland Kitchen Zosyn [Piperacillin Sod-Tazobactam So]       Review of Systems  Constitutional: Negative for fever and chills.  HENT: Negative for congestion and sore throat.   Respiratory: Positive for shortness of breath. Negative for cough and wheezing.     Objective  Filed Vitals:   09/09/15 0938  BP: 132/78  Pulse: 98  Temp: 98.5 F (36.9 C)  Resp: 18  Height: 6' (1.829 m)  Weight: 407 lb (184.614 kg)  SpO2: 96%    Physical Exam  Constitutional: He is well-developed, well-nourished, and in no distress.  Cardiovascular: Regular rhythm.  Tachycardia present.   Pulmonary/Chest: Breath sounds normal. He has no wheezes. He has no rales.  Musculoskeletal:       Right ankle: He exhibits swelling.       Left ankle: He exhibits swelling.  2+ pitting bilateral ankle edema.  Nursing note and vitals reviewed.    Assessment & Plan  1. Chronic diastolic CHF (congestive heart failure) (HCC) Advised to follow-up on CHF with cardiology, possibly repeat echocardiogram. Continue on diuretic as recommended by cardiologist.  2. Influenza Symptoms have now resolved. Received Tamiflu while in the hospital.  3. OSA on CPAP Patient has a CPAP, which is old but is in working order. We will discuss with PCP about repeating sleep study   Amirah Goerke [facility] 09/09/2015 10:16 AM

## 2015-09-23 ENCOUNTER — Ambulatory Visit: Payer: Self-pay | Admitting: Family Medicine

## 2015-10-04 ENCOUNTER — Other Ambulatory Visit: Payer: Self-pay | Admitting: Family Medicine

## 2015-10-07 ENCOUNTER — Other Ambulatory Visit: Payer: Self-pay | Admitting: Family Medicine

## 2015-10-07 ENCOUNTER — Ambulatory Visit: Payer: Self-pay | Admitting: Family Medicine

## 2015-10-18 ENCOUNTER — Telehealth: Payer: Self-pay | Admitting: Cardiovascular Disease

## 2015-10-18 NOTE — Telephone Encounter (Signed)
Pt needs a clearance letter for him to have bariatric surgery. Pt states [place]. Please call.

## 2015-10-18 NOTE — Telephone Encounter (Signed)
Can we set pt up to see Dr. Rockey Situ to discuss? He is due this month for his 1 year f/u.

## 2015-10-19 NOTE — Telephone Encounter (Signed)
Called pt to set up appt, left mom to call back

## 2015-11-03 ENCOUNTER — Other Ambulatory Visit: Payer: Self-pay | Admitting: Family Medicine

## 2015-11-29 ENCOUNTER — Telehealth: Payer: Self-pay | Admitting: Family Medicine

## 2015-11-29 NOTE — Telephone Encounter (Signed)
Patient requesting refill on Valtrax. Please send to [facility]

## 2015-11-30 ENCOUNTER — Ambulatory Visit (INDEPENDENT_AMBULATORY_CARE_PROVIDER_SITE_OTHER): Payer: Self-pay | Admitting: Nurse Practitioner

## 2015-11-30 ENCOUNTER — Telehealth: Payer: Self-pay

## 2015-11-30 ENCOUNTER — Encounter: Payer: Self-pay | Admitting: Nurse Practitioner

## 2015-11-30 VITALS — BP 112/70 | HR 95 | Ht 72.0 in | Wt >= 6400 oz

## 2015-11-30 DIAGNOSIS — I10 Essential (primary) hypertension: Secondary | ICD-10-CM

## 2015-11-30 DIAGNOSIS — Z01818 Encounter for other preprocedural examination: Secondary | ICD-10-CM

## 2015-11-30 DIAGNOSIS — E119 Type 2 diabetes mellitus without complications: Secondary | ICD-10-CM

## 2015-11-30 DIAGNOSIS — R0602 Shortness of breath: Secondary | ICD-10-CM

## 2015-11-30 DIAGNOSIS — E7800 Pure hypercholesterolemia, unspecified: Secondary | ICD-10-CM | POA: Insufficient documentation

## 2015-11-30 DIAGNOSIS — I5032 Chronic diastolic (congestive) heart failure: Secondary | ICD-10-CM

## 2015-11-30 MED ORDER — VALACYCLOVIR HCL 500 MG PO TABS: 500.0000 mg | ORAL_TABLET | Freq: Two times a day (BID) | ORAL | Status: DC | PRN

## 2015-11-30 NOTE — Telephone Encounter (Signed)
Pt should have new provider fill medications

## 2015-11-30 NOTE — Telephone Encounter (Signed)
Pt seen today by Ignacia Bayley, NP for cardiac clearance,o/v notes faxed to [facility] fax # [phone], ph # [phone]

## 2015-11-30 NOTE — Telephone Encounter (Signed)
Patient informed about the prescription. But he also informed me that he has changed providers, he is no longer a patient at [facility].

## 2015-11-30 NOTE — Patient Instructions (Signed)
Medication Instructions:  Your physician recommends that you continue on your current medications as directed. Please refer to the Current Medication list given to you today.   Labwork: None ordered  Testing/Procedures: None ordered  Follow-Up: Your physician wants you to follow-up in: 1 year with Dr.Gollan You will receive a reminder letter in the mail two months in advance. If you don't receive a letter, please call our office to schedule the follow-up appointment.   Any Other Special Instructions Will Be Listed Below (If Applicable).     If you need a refill on your cardiac medications before your next appointment, please call your pharmacy.

## 2015-11-30 NOTE — Telephone Encounter (Signed)
Please inform pt he must RTO for further refills

## 2015-11-30 NOTE — Progress Notes (Signed)
Office Visit    Patient Name: Michael Martinez Date of Encounter: 11/30/2015  Primary Care Provider:  Ashok Norris, MD Primary Cardiologist:  Johnny Bridge, MD   Chief Complaint    60 year old male with a history of obesity, hypertension, hyperlipidemia, diastolic heart failure, and chronic dyspnea who presents for follow-up and preoperative evaluation.  Past Medical History    Past Medical History  Diagnosis Date  . DVT (deep venous thrombosis) ([place]) 2006    "after ankle surgery"  . Gout   . Obesity   . Essential hypertension   . Hypercholesteremia   . Chronic diastolic CHF (congestive heart failure) ([place])     a. 11/2013 Echo: EF 45-50%, mild LVH, gr 1 DD, mildly dil LA.  . OSA on CPAP   . Peripheral edema     chronic  . Clotting disorder (HCC)     deep vein thrombosis left leg  . Melanoma ([place])     left ear  . Shortness of breath   . Cellulitis and abscess of lower extremity 03/24/2014    RT LEG  . Type II diabetes mellitus ([place])   . GERD (gastroesophageal reflux disease)   . Arthritis     RA  . Bronchitis   . Cellulitis of right lower extremity     Recurrent   Past Surgical History  Procedure Laterality Date  . Hernia repair    . Tonsillectomy    . Skin cancer excision      Allergies  Allergies  Allergen Reactions  . Penicillins     Pt complained of chest tightness, pt very diaphoretic, pt chest, arms red, difficulty breathing.  Marland Kitchen Zosyn [Piperacillin Sod-Tazobactam So]     History of Present Illness    60 year old male with a history of morbid obesity, obstructive sleep apnea, hypertension, hyperlipidemia, type 2 diabetes mellitus, and prior postoperative DVT. He also has a history of chronic dyspnea on exertion with low normal LV function by echocardiogram in 2015. He treats his dyspnea to his weight. In February of this year, he was admitted to [facility] regional second her to progressive dyspnea and diagnosed with flu. He had a prolonged recovery but  after about 3 weeks, was feeling back to normal. He says he tries to remain pretty active at home. 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 He says he has no trouble with routine activities at a normal pace but will experience dyspnea on exertion at a higher level of activity. He has no prior history of chest pain. Further, he denies any history of PND, orthopnea, dizziness, syncope, or early satiety. He has some degree of chronic bilateral lower extremity edema, which is well managed with oral torsemide and compression stockings.  He is currently in the process of arranging for an elective gastric sleeve procedure to be performed in [place], hopefully in July. This will be performed laparoscopically and he requires preoperative cardiac clearance.  Home Medications    Prior to Admission medications   Medication Sig Start Date End Date Taking? Authorizing Provider  albuterol (PROVENTIL) (2.5 MG/3ML) 0.083% nebulizer solution Take 3 mLs (2.5 mg total) by nebulization every 6 (six) hours as needed for wheezing or shortness of breath. 08/31/15  Yes Loleta Chance, MD  glimepiride (AMARYL) 4 MG tablet TAKE 1 TABLET BY MOUTH EVERY DAY 10/06/15  Yes Ashok Norris, MD  HYDROcodone-acetaminophen (NORCO/VICODIN) 5-325 MG tablet Take 1 tablet by mouth every 8 (eight) hours as needed. 05/04/15  Yes Ashok Norris,  MD  ibuprofen (ADVIL,MOTRIN) 200 MG tablet Take 2 tablets (400 mg total) by mouth every 6 (six) hours as needed for moderate pain (gout). 08/31/15  Yes Loleta Chance, MD  indomethacin (INDOCIN) 25 MG capsule Take 25 mg by mouth 2 (two) times daily as needed.   Yes Historical Provider, MD  losartan (COZAAR) 100 MG tablet Take 1 tablet (100 mg total) by mouth daily. 05/04/15  Yes Ashok Norris, MD  lovastatin (MEVACOR) 40 MG tablet Take 1 tablet (40 mg total) by mouth at bedtime. 05/04/15  Yes Ashok Norris, MD  metFORMIN (GLUCOPHAGE) 1000 MG tablet TAKE 1 TABLET BY MOUTH TWICE DAILY 10/06/15   Yes Ashok Norris, MD  naproxen (NAPROSYN) 500 MG tablet TAKE ONE TABLET BY MOUTH TWICE DAILY 11/03/15  Yes Ashok Norris, MD  Spacer/Aero-Holding Chambers (AEROCHAMBER PLUS FLO-VU LARGE) MISC Use spacer when using albuterol. 08/31/15  Yes Loleta Chance, MD  torsemide (DEMADEX) 20 MG tablet TAKE ONE TABLET BY MOUTH ONCE TO TWICE DAILY 11/03/15  Yes Ashok Norris, MD  valACYclovir (VALTREX) 500 MG tablet Take 1 tablet (500 mg total) by mouth 2 (two) times daily as needed (for fever blisters). For 5 days 11/30/15  Yes Ashok Norris, MD    Review of Systems    Some degree of chronic dyspnea on exertion at higher levels of activity. He does well with routine activities. He has some degree of chronic bilateral lower extremity edema, which is reasonably well controlled. He denies chest pain, palpitations, PND, orthopnea, dizziness, syncope, or early satiety. All other systems reviewed and are otherwise negative except as noted above.  Physical Exam    VS:  BP 112/70 mmHg  Pulse 95  Ht 6' (1.829 m)  Wt 416 lb (188.696 kg)  BMI 56.41 kg/m2 , BMI Body mass index is 56.41 kg/(m^2). GEN: Morbidly obese Caucasian male in no acute distress. HEENT: normal. Neck: Supple, obese, no carotid bruits, or masses. Difficult to gauge JVP secondary to girth. Trace bilateral lower extremity edema. Cardiac: RRR, no murmurs, rubs, or gallops. No clubbing, cyanosis.  Radials/DP/PT 2+ and equal bilaterally.  Respiratory:  Respirations regular and unlabored, clear to auscultation bilaterally. GI: Soft, nontender, nondistended, BS + x 4. MS: no deformity or atrophy. Skin: warm and dry, no rash. Neuro:  Strength and sensation are intact. Psych: Normal affect.  Accessory Clinical Findings    ECG - Regular sinus rhythm, 95, left axis deviation, delayed R-wave progression-no acute changes.  Assessment & Plan    1.  Morbid obesity/preoperative evaluation: Patient is pending elective gastric sleeve surgery to be  performed in [place]. He presents today for preoperative evaluation. He has no prior history of chest pain though does have chronic dyspnea on exertion with low normal LV function and presence of diastolic dysfunction on echocardiogram in 2015. He has never undergone stress testing as his size prohibits adequate imaging.  A CT scan of his chest in 2015 did not show any significant coronary calcification concerning for plaque. Given stable symptoms, he may proceed with surgery as planned without any additional preoperative testing. It should be noted that with his history of diastolic heart failure, he may be prone to volume retention and thus the surgical team will need to be careful with IV fluids in the perioperative period. He should remain on statin therapy throughout the perioperative period.  2. Chronic diastolic congestive heart failure: His weight has been climbing since February of this year, though he tripped this to increasing body mass as opposed to fluid.  Overall, he notes that his volume status lower extremity edema have been stable on daily torsemide. He also wears support hose to help with lower extremity edema. His heart rate and blood pressure well controlled today. As above, careful attention will need to be paid to his IV fluids during the perioperative period.  3. Hypertensive heart disease: Stable on ARB and diuretic therapy.  4. Hyperlipidemia: Continue statin therapy.  5. Obstructive sleep apnea: Patient uses C Pap.  6. Type 2 diabetes mellitus: He is on oral agents only and is hopeful that his diabetes will be much better managed following gastric sleeve surgery.  7. Disposition: Follow-up with Dr. Burna Cash in one year or sooner if necessary.   Murray Hodgkins, NP 11/30/2015, 9:30 AM

## 2015-12-07 ENCOUNTER — Telehealth: Payer: Self-pay | Admitting: Family Medicine

## 2015-12-14 ENCOUNTER — Ambulatory Visit (INDEPENDENT_AMBULATORY_CARE_PROVIDER_SITE_OTHER): Payer: Self-pay | Admitting: Family Medicine

## 2015-12-14 ENCOUNTER — Encounter: Payer: Self-pay | Admitting: Family Medicine

## 2015-12-14 VITALS — BP 118/70 | HR 96 | Temp 98.5°F | Resp 18 | Ht 72.0 in | Wt >= 6400 oz

## 2015-12-14 DIAGNOSIS — E119 Type 2 diabetes mellitus without complications: Secondary | ICD-10-CM

## 2015-12-14 DIAGNOSIS — Z86718 Personal history of other venous thrombosis and embolism: Secondary | ICD-10-CM

## 2015-12-14 DIAGNOSIS — G4733 Obstructive sleep apnea (adult) (pediatric): Secondary | ICD-10-CM

## 2015-12-14 LAB — COMPLETE METABOLIC PANEL WITH GFR
ALT: 24 U/L (ref 9–46)
AST: 17 U/L (ref 10–35)
Albumin: 3.7 g/dL (ref 3.6–5.1)
Alkaline Phosphatase: 92 U/L (ref 40–115)
BUN: 29 mg/dL — AB (ref 7–25)
CALCIUM: 9.1 mg/dL (ref 8.6–10.3)
CHLORIDE: 99 mmol/L (ref 98–110)
CO2: 26 mmol/L (ref 20–31)
CREATININE: 1.35 mg/dL — AB (ref 0.70–1.25)
GFR, Est African American: 65 mL/min (ref >=60)
GFR, Est Non African American: 56 mL/min — ABNORMAL LOW (ref >=60)
Glucose, Bld: 284 mg/dL — ABNORMAL HIGH (ref 70–99)
Potassium: 4.6 mmol/L (ref 3.5–5.3)
Sodium: 137 mmol/L (ref 135–146)
Total Bilirubin: 0.4 mg/dL (ref 0.2–1.2)
Total Protein: 6.2 g/dL (ref 6.1–8.1)

## 2015-12-14 LAB — CBC WITH DIFFERENTIAL/PLATELET
Basophils Absolute: 0 {cells}/uL (ref 0–200)
Basophils Relative: 0 %
Eosinophils Absolute: 248 {cells}/uL (ref 15–500)
Eosinophils Relative: 4 %
HCT: 42 % (ref 38.5–50.0)
Hemoglobin: 13.5 g/dL (ref 13.0–17.0)
Lymphocytes Relative: 30 %
Lymphs Abs: 1860 {cells}/uL (ref 850–3900)
MCH: 27.8 pg (ref 27.0–33.0)
MCHC: 32.1 g/dL (ref 32.0–36.0)
MCV: 86.6 fL (ref 80.0–100.0)
MPV: 9.9 fL (ref 7.5–12.5)
Monocytes Absolute: 558 {cells}/uL (ref 200–950)
Monocytes Relative: 9 %
Neutro Abs: 3534 {cells}/uL (ref 1500–7800)
Neutrophils Relative %: 57 %
Platelets: 234 K/uL (ref 140–400)
RBC: 4.85 MIL/uL (ref 4.20–5.80)
RDW: 14.9 % (ref 11.0–15.0)
WBC: 6.2 K/uL (ref 3.8–10.8)

## 2015-12-14 LAB — LIPID PANEL
Cholesterol: 184 mg/dL (ref 125–200)
HDL: 54 mg/dL
LDL Cholesterol: 115 mg/dL
Total CHOL/HDL Ratio: 3.4 ratio
Triglycerides: 73 mg/dL
VLDL: 15 mg/dL

## 2015-12-14 LAB — HEMOGLOBIN A1C
Hgb A1c MFr Bld: 8 % — ABNORMAL HIGH (ref <5.7)
Mean Plasma Glucose: 183 mg/dL

## 2015-12-14 NOTE — Progress Notes (Signed)
Subjective:    Patient ID: Michael Martinez, male    DOB: [birth year], 60 y.o.   MRN: [id number]  HPI Patient is a very pleasant 60 year old white male here today to establish care. Past medical history is significant for a DVT after surgery on his ankle in 2006. History of type 2 diabetes mellitus, morbid obesity, obstructive sleep apnea. He has chronic diastolic congestive heart failure with pitting edema in both legs which he treats with torsemide and compression hose. Patient is scheduled to have laparoscopic gastric sleeve surgery performed in [place] in one month. He is doing this because of insurance reasons and caused. I am not an advocate for this but the patient has already made up his mind. I had a long discussion today regarding follow-up and potential complications. He is due for a prostate exam, colonoscopy, Prevnar 13. However he is paying for everything out of pocket and he defers this at the present time as he is saving for his upcoming surgery Past Medical History  Diagnosis Date  . DVT (deep venous thrombosis) ([place]) 2006    "after ankle surgery"  . Gout   . Obesity   . Essential hypertension   . Hypercholesteremia   . Chronic diastolic CHF (congestive heart failure) ([place])     a. 11/2013 Echo: EF 45-50%, mild LVH, gr 1 DD, mildly dil LA.  . OSA on CPAP   . Peripheral edema     chronic  . Clotting disorder (HCC)     deep vein thrombosis left leg  . Melanoma ([place])     left ear  . Shortness of breath   . Cellulitis and abscess of lower extremity 03/24/2014    RT LEG  . Type II diabetes mellitus ([place])   . GERD (gastroesophageal reflux disease)   . Arthritis     RA  . Bronchitis   . Cellulitis of right lower extremity     Recurrent   Past Surgical History  Procedure Laterality Date  . Hernia repair    . Tonsillectomy    . Skin cancer excision     Current Outpatient Prescriptions on File Prior to Visit  Medication Sig Dispense Refill  . albuterol (PROVENTIL) (2.5  MG/3ML) 0.083% nebulizer solution Take 3 mLs (2.5 mg total) by nebulization every 6 (six) hours as needed for wheezing or shortness of breath. 75 mL 12  . glimepiride (AMARYL) 4 MG tablet TAKE 1 TABLET BY MOUTH EVERY DAY 90 tablet 0  . HYDROcodone-acetaminophen (NORCO/VICODIN) 5-325 MG tablet Take 1 tablet by mouth every 8 (eight) hours as needed. 30 tablet 3  . ibuprofen (ADVIL,MOTRIN) 200 MG tablet Take 2 tablets (400 mg total) by mouth every 6 (six) hours as needed for moderate pain (gout). 30 tablet 0  . indomethacin (INDOCIN) 25 MG capsule Take 25 mg by mouth 2 (two) times daily as needed.    Marland Kitchen losartan (COZAAR) 100 MG tablet Take 1 tablet (100 mg total) by mouth daily. 30 tablet 2  . lovastatin (MEVACOR) 40 MG tablet Take 1 tablet (40 mg total) by mouth at bedtime. 30 tablet 3  . metFORMIN (GLUCOPHAGE) 1000 MG tablet TAKE 1 TABLET BY MOUTH TWICE DAILY 180 tablet 0  . naproxen (NAPROSYN) 500 MG tablet TAKE ONE TABLET BY MOUTH TWICE DAILY 60 tablet 0  . Spacer/Aero-Holding Chambers (AEROCHAMBER PLUS FLO-VU LARGE) MISC Use spacer when using albuterol. 10 each 0  . torsemide (DEMADEX) 20 MG tablet TAKE ONE TABLET BY MOUTH ONCE TO TWICE DAILY 180  tablet 0  . valACYclovir (VALTREX) 500 MG tablet Take 1 tablet (500 mg total) by mouth 2 (two) times daily as needed (for fever blisters). For 5 days 20 tablet 0   No current facility-administered medications on file prior to visit.   Allergies  Allergen Reactions  . Penicillins     Pt complained of chest tightness, pt very diaphoretic, pt chest, arms red, difficulty breathing.  Marland Kitchen Zosyn [Piperacillin Sod-Tazobactam So]    Social History   Social History  . Marital Status: Married    Spouse Name: N/A  . Number of Children: N/A  . Years of Education: N/A   Occupational History  . Not on file.   Social History Main Topics  . Smoking status: Never Smoker   . Smokeless tobacco: Never Used  . Alcohol Use: No  . Drug Use: No  . Sexual  Activity: Not on file   Other Topics Concern  . Not on file   Social History Narrative   Family History  Problem Relation Age of Onset  . Stroke Mother   . Hypertension Mother   . Asthma Mother   . Hypertension Father   . Asthma Sister   . Asthma Sister   . Asthma Daughter   . Tuberculosis Mother       Review of Systems  All other systems reviewed and are negative.      Objective:   Physical Exam  Constitutional: He is oriented to person, place, and time. He appears well-developed and well-nourished. No distress.  HENT:  Head: Normocephalic and atraumatic.  Eyes: Conjunctivae and EOM are normal. Pupils are equal, round, and reactive to light.  Neck: Neck supple. No JVD present. No thyromegaly present.  Cardiovascular: Normal rate, regular rhythm and normal heart sounds.   No murmur heard. Pulmonary/Chest: Effort normal and breath sounds normal. No respiratory distress. He has no wheezes. He has no rales.  Abdominal: Soft. Bowel sounds are normal. He exhibits no distension. There is no tenderness. There is no rebound and no guarding.  Musculoskeletal: He exhibits edema.  Lymphadenopathy:    He has no cervical adenopathy.  Neurological: He is alert and oriented to person, place, and time. He displays normal reflexes. No cranial nerve deficit. He exhibits normal muscle tone. Coordination normal.  Skin: Skin is warm and dry. No rash noted. He is not diaphoretic. No erythema. No pallor.  Vitals reviewed.         Assessment & Plan:  Controlled type 2 diabetes mellitus without complication, without long-term current use of insulin ([place]) - Plan: COMPLETE METABOLIC PANEL WITH GFR, CBC with Differential/Platelet, Lipid panel, Hemoglobin A1c  Morbid obesity due to excess calories (HCC)  History of DVT (deep vein thrombosis)  Obstructive sleep apnea  We spent more than 25 minutes today discussing potential follow-up complications for his upcoming surgery. The first thing  I'm concerned about is the necessary vitamins and minerals he will inquire after gastric bypass. Most patients require chronic repletion of vitamin B12, calcium, and iron. I recommended the patient discuss this with his surgeon and his nurses. If they provide him no follow-up, I will start him on replacements of these vitamins and minerals when he returns from his surgery in one month. My second concern is his risk of a DVT. The patient is at higher risk for DVT given his past medical history. Will also be recovering from the surgery was present even high risk for DVT. Furthermore he is flying cross[place]country to [facility]. All  of these are risk for DVT after surgery. I recommended he discuss this with the surgeon. I would at least recommend compression hose on the airplane, a full dose aspirin although there is no good evidence based research on this, and frequently changing positions and standing and walking about  To improve circulation in his legs. The third concern is hypoglycemia after surgery. I anticipate that his food intake with diminish greatly after his surgery. We will likely need to discontinue Amaryl after surgery. I will obtain baseline lab work to determine how well controlled his blood sugars are so that I can give him an educated answer on what to do with his Amaryl perioperatively. He defers a colonoscopy, PSA, and Prevnar 13 at the present time

## 2015-12-16 ENCOUNTER — Other Ambulatory Visit: Payer: Self-pay | Admitting: *Deleted

## 2015-12-16 MED ORDER — GLIMEPIRIDE 4 MG PO TABS: 4.0000 mg | ORAL_TABLET | Freq: Every day | ORAL | Status: DC

## 2015-12-27 ENCOUNTER — Telehealth: Payer: Self-pay | Admitting: Cardiovascular Disease

## 2015-12-27 NOTE — Telephone Encounter (Signed)
Pt would like [place] to send his EKG to his bariatric surgeon  Attn: ID number [id number] Fax: [phone]  Send this in along with clearance letter that we wrote for him.  Surgery is 4 w from 12/28/15

## 2015-12-27 NOTE — Telephone Encounter (Signed)
S/w pt to confirm fax to [facility] per pt request. He is appreciative of the call and states they would like him to have current echo. He asks if he can schedule with [place]. Advised pt to have surgeon fax over request for current echo then scheduling would contact him. He verbalized understanding with no further questions.

## 2015-12-28 NOTE — Telephone Encounter (Signed)
Patient has transferred. He is now seeing Dr Dennard Schaumann.

## 2016-01-03 ENCOUNTER — Other Ambulatory Visit: Payer: Self-pay | Admitting: Family Medicine

## 2016-01-05 ENCOUNTER — Other Ambulatory Visit: Payer: Self-pay | Admitting: Family Medicine

## 2016-01-05 MED ORDER — LOSARTAN POTASSIUM 100 MG PO TABS: 100.0000 mg | ORAL_TABLET | Freq: Every day | ORAL | Status: DC

## 2016-01-05 NOTE — Telephone Encounter (Signed)
Medication refilled per protocol. 

## 2016-01-06 ENCOUNTER — Other Ambulatory Visit: Payer: Self-pay | Admitting: Family Medicine

## 2016-01-06 MED ORDER — LOSARTAN POTASSIUM 100 MG PO TABS: 100.0000 mg | ORAL_TABLET | Freq: Every day | ORAL | Status: DC

## 2016-01-15 HISTORY — PX: OTHER SURGICAL HISTORY: SHX169

## 2016-01-31 ENCOUNTER — Encounter (HOSPITAL_COMMUNITY): Payer: Self-pay

## 2016-01-31 ENCOUNTER — Emergency Department (HOSPITAL_COMMUNITY): Payer: Self-pay

## 2016-01-31 ENCOUNTER — Emergency Department (HOSPITAL_COMMUNITY)
Admission: EM | Admit: 2016-01-31 | Discharge: 2016-02-01 | Disposition: A | Discharge status: Home / Self Care | Payer: Self-pay | Attending: Emergency Medicine | Admitting: Emergency Medicine

## 2016-01-31 DIAGNOSIS — I5032 Chronic diastolic (congestive) heart failure: Secondary | ICD-10-CM | POA: Insufficient documentation

## 2016-01-31 DIAGNOSIS — E119 Type 2 diabetes mellitus without complications: Secondary | ICD-10-CM | POA: Insufficient documentation

## 2016-01-31 DIAGNOSIS — R509 Fever, unspecified: Secondary | ICD-10-CM | POA: Insufficient documentation

## 2016-01-31 DIAGNOSIS — I110 Hypertensive heart disease with heart failure: Secondary | ICD-10-CM | POA: Insufficient documentation

## 2016-01-31 DIAGNOSIS — T819XXA Unspecified complication of procedure, initial encounter: Secondary | ICD-10-CM | POA: Insufficient documentation

## 2016-01-31 DIAGNOSIS — Z79899 Other long term (current) drug therapy: Secondary | ICD-10-CM | POA: Insufficient documentation

## 2016-01-31 DIAGNOSIS — Y733 Surgical instruments, materials and gastroenterology and urology devices (including sutures) associated with adverse incidents: Secondary | ICD-10-CM | POA: Insufficient documentation

## 2016-01-31 DIAGNOSIS — Z7984 Long term (current) use of oral hypoglycemic drugs: Secondary | ICD-10-CM | POA: Insufficient documentation

## 2016-01-31 LAB — COMPREHENSIVE METABOLIC PANEL
ALT: 74 U/L — ABNORMAL HIGH (ref 17–63)
ANION GAP: 5 (ref 5–15)
AST: 27 U/L (ref 15–41)
Albumin: 2.9 g/dL — ABNORMAL LOW (ref 3.5–5.0)
Alkaline Phosphatase: 97 U/L (ref 38–126)
BILIRUBIN TOTAL: 1.3 mg/dL — AB (ref 0.3–1.2)
BUN: 7 mg/dL (ref 6–20)
CHLORIDE: 103 mmol/L (ref 101–111)
CO2: 25 mmol/L (ref 22–32)
Calcium: 8.5 mg/dL — ABNORMAL LOW (ref 8.9–10.3)
Creatinine, Ser: 1.37 mg/dL — ABNORMAL HIGH (ref 0.61–1.24)
GFR, EST NON AFRICAN AMERICAN: 54 mL/min — AB (ref >60)
Glucose, Bld: 145 mg/dL — ABNORMAL HIGH (ref 65–99)
POTASSIUM: 3.7 mmol/L (ref 3.5–5.1)
Sodium: 133 mmol/L — ABNORMAL LOW (ref 135–145)
TOTAL PROTEIN: 5.8 g/dL — AB (ref 6.5–8.1)

## 2016-01-31 LAB — URINALYSIS, ROUTINE W REFLEX MICROSCOPIC
Bilirubin Urine: NEGATIVE
GLUCOSE, UA: NEGATIVE mg/dL
Hgb urine dipstick: NEGATIVE
KETONES UR: NEGATIVE mg/dL
LEUKOCYTES UA: NEGATIVE
NITRITE: NEGATIVE
PH: 6 (ref 5.0–8.0)
PROTEIN: NEGATIVE mg/dL
Specific Gravity, Urine: 1.012 (ref 1.005–1.030)

## 2016-01-31 LAB — CBC WITH DIFFERENTIAL/PLATELET
BASOS ABS: 0 10*3/uL (ref 0.0–0.1)
BASOS PCT: 0 %
Eosinophils Absolute: 0.2 10*3/uL (ref 0.0–0.7)
Eosinophils Relative: 2 %
HEMATOCRIT: 39.3 % (ref 39.0–52.0)
Hemoglobin: 13.2 g/dL (ref 13.0–17.0)
LYMPHS PCT: 22 %
Lymphs Abs: 1.9 10*3/uL (ref 0.7–4.0)
MCH: 28.2 pg (ref 26.0–34.0)
MCHC: 33.6 g/dL (ref 30.0–36.0)
MCV: 84 fL (ref 78.0–100.0)
MONO ABS: 0.9 10*3/uL (ref 0.1–1.0)
Monocytes Relative: 11 %
NEUTROS ABS: 5.4 10*3/uL (ref 1.7–7.7)
Neutrophils Relative %: 65 %
PLATELETS: 145 10*3/uL — AB (ref 150–400)
RBC: 4.68 MIL/uL (ref 4.22–5.81)
RDW: 13.8 % (ref 11.5–15.5)
WBC: 8.4 10*3/uL (ref 4.0–10.5)

## 2016-01-31 LAB — I-STAT CG4 LACTIC ACID, ED: LACTIC ACID, VENOUS: 1.09 mmol/L (ref 0.5–1.9)

## 2016-01-31 MED ORDER — SODIUM CHLORIDE 0.9 % IV BOLUS (SEPSIS)
1000.0000 mL | Freq: Once | INTRAVENOUS | Status: AC
  Administered 2016-01-31: 1000 mL via INTRAVENOUS

## 2016-01-31 NOTE — ED Provider Notes (Signed)
CSN: [id number]     Arrival date & time 01/31/16  1410 History   First MD Initiated Contact with Patient 01/31/16 2026     Chief Complaint  Patient presents with  . Post-op Problem     (Consider location/radiation/quality/duration/timing/severity/associated sxs/prior Treatment) HPI Comments: This is a 60 year old morbidly obese male who states that he had gastric sleeve surgery Tuesday and [place] returned to the [place] on Friday.  Yesterday he had a fever to 102, denies any nausea, vomiting, shortness of breath, cough, chest pain, dysuria, diarrhea or constipation, abdominal pain.  His wounds have no drainage  The history is provided by the patient.    Past Medical History  Diagnosis Date  . DVT (deep venous thrombosis) ([place]) 2006    "after ankle surgery"  . Gout   . Obesity   . Essential hypertension   . Hypercholesteremia   . Chronic diastolic CHF (congestive heart failure) ([place])     a. 11/2013 Echo: EF 45-50%, mild LVH, gr 1 DD, mildly dil LA.  . OSA on CPAP   . Peripheral edema     chronic  . Clotting disorder (HCC)     deep vein thrombosis left leg  . Melanoma ([place])     left ear  . Shortness of breath   . Cellulitis and abscess of lower extremity 03/24/2014    RT LEG  . Type II diabetes mellitus ([place])   . GERD (gastroesophageal reflux disease)   . Arthritis     RA  . Bronchitis   . Cellulitis of right lower extremity     Recurrent   Past Surgical History  Procedure Laterality Date  . Hernia repair    . Tonsillectomy    . Skin cancer excision     Family History  Problem Relation Age of Onset  . Stroke Mother   . Hypertension Mother   . Asthma Mother   . Hypertension Father   . Asthma Sister   . Asthma Sister   . Asthma Daughter   . Tuberculosis Mother    Social History  Substance Use Topics  . Smoking status: Never Smoker   . Smokeless tobacco: Never Used  . Alcohol Use: No    Review of Systems  Constitutional: Positive for fever. Negative  for chills.  Respiratory: Negative for cough and shortness of breath.   Cardiovascular: Negative for leg swelling.  Gastrointestinal: Negative for vomiting, abdominal pain, diarrhea, constipation and abdominal distention.  Musculoskeletal: Negative for back pain.  Skin: Positive for wound.  All other systems reviewed and are negative.     Allergies  Penicillins and Zosyn  Home Medications   Prior to Admission medications   Medication Sig Start Date End Date Taking? Authorizing Provider  ciprofloxacin (CIPRO) 500 MG tablet Take 500 mg by mouth 2 (two) times daily.   Yes Historical Provider, MD  albuterol (PROVENTIL) (2.5 MG/3ML) 0.083% nebulizer solution Take 3 mLs (2.5 mg total) by nebulization every 6 (six) hours as needed for wheezing or shortness of breath. Patient not taking: Reported on 01/31/2016 08/31/15   Loleta Chance, MD  glimepiride (AMARYL) 4 MG tablet Take 1 tablet (4 mg total) by mouth daily. Patient not taking: Reported on 01/31/2016 12/16/15   Susy Frizzle, MD  HYDROcodone-acetaminophen (NORCO/VICODIN) 5-325 MG tablet Take 1 tablet by mouth every 8 (eight) hours as needed. Patient not taking: Reported on 01/31/2016 05/04/15   Ashok Norris, MD  ibuprofen (ADVIL,MOTRIN) 200 MG tablet Take 2 tablets (400 mg total) by  mouth every 6 (six) hours as needed for moderate pain (gout). Patient not taking: Reported on 01/31/2016 08/31/15   Loleta Chance, MD  losartan (COZAAR) 100 MG tablet Take 1 tablet (100 mg total) by mouth daily. Patient not taking: Reported on 01/31/2016 01/06/16   Susy Frizzle, MD  lovastatin (MEVACOR) 40 MG tablet Take 1 tablet (40 mg total) by mouth at bedtime. Patient not taking: Reported on 01/31/2016 05/04/15   Ashok Norris, MD  metFORMIN (GLUCOPHAGE) 1000 MG tablet TAKE 1 TABLET BY MOUTH TWICE DAILY Patient not taking: Reported on 01/31/2016 10/06/15   Ashok Norris, MD  naproxen (NAPROSYN) 500 MG tablet TAKE ONE TABLET BY MOUTH TWICE DAILY Patient  not taking: Reported on 01/31/2016 11/03/15   Ashok Norris, MD  Spacer/Aero-Holding Chambers (AEROCHAMBER PLUS FLO-VU LARGE) MISC Use spacer when using albuterol. 08/31/15   Loleta Chance, MD  torsemide (DEMADEX) 20 MG tablet TAKE ONE TABLET BY MOUTH ONCE TO TWICE DAILY Patient not taking: Reported on 01/31/2016 11/03/15   Ashok Norris, MD  valACYclovir (VALTREX) 500 MG tablet Take 1 tablet (500 mg total) by mouth 2 (two) times daily as needed (for fever blisters). For 5 days Patient not taking: Reported on 01/31/2016 11/30/15   Ashok Norris, MD   BP 139/91 mmHg  Pulse 82  Temp(Src) 98.7 F (37.1 C) (Oral)  Resp 16  Wt 175.315 kg  SpO2 100% Physical Exam  Constitutional: He appears well-developed and well-nourished.  HENT:  Head: Normocephalic.  Eyes: Pupils are equal, round, and reactive to light.  Neck: Normal range of motion.  Cardiovascular: Normal rate and regular rhythm.   Pulmonary/Chest: Effort normal and breath sounds normal.  Abdominal: Bowel sounds are normal. He exhibits no distension. There is no tenderness.    Musculoskeletal: Normal range of motion.  Neurological: He is alert.  Skin: Skin is warm.  Nursing note and vitals reviewed.   ED Course  Procedures (including critical care time) Labs Review Labs Reviewed  COMPREHENSIVE METABOLIC PANEL - Abnormal; Notable for the following:    Sodium 133 (*)    Glucose, Bld 145 (*)    Creatinine, Ser 1.37 (*)    Calcium 8.5 (*)    Total Protein 5.8 (*)    Albumin 2.9 (*)    ALT 74 (*)    Total Bilirubin 1.3 (*)    GFR calc non Af Amer 54 (*)    All other components within normal limits  CBC WITH DIFFERENTIAL/PLATELET - Abnormal; Notable for the following:    Platelets 145 (*)    All other components within normal limits  URINE CULTURE  URINALYSIS, ROUTINE W REFLEX MICROSCOPIC (NOT AT [facility])  I-STAT CG4 LACTIC ACID, ED    Imaging Review Dg Chest 2 View  01/31/2016  CLINICAL DATA:  Fever. Postoperative from  gastric bypass surgery 3 days prior. EXAM: CHEST  2 VIEW COMPARISON:  08/29/2015 chest radiograph. FINDINGS: Stable cardiomediastinal silhouette with normal heart size. No pneumothorax. No pleural effusion. Minimal patchy opacity at the left posterior costophrenic angle with associated slight blunting of the left costophrenic angle. No pulmonary edema. IMPRESSION: Minimal patchy opacity at the left posterior costophrenic angle with associated slight blunting of the left costophrenic angle, favor trace left pleural effusion and minimal atelectasis. Otherwise no active disease. Electronically Signed   By: Ilona Sorrel M.D.   On: 01/31/2016 16:18   Dg Abd 1 View  01/31/2016  CLINICAL DATA:  Postop, gastric sleeve.  Fever. EXAM: ABDOMEN - 1 VIEW COMPARISON:  None.  FINDINGS: The bowel gas pattern is normal. No radio-opaque calculi or other significant radiographic abnormality are seen. IMPRESSION: Negative. Electronically Signed   By: Dorise Bullion III M.D   On: 01/31/2016 22:01   I have personally reviewed and evaluated these images and lab results as part of my medical decision-making.   EKG Interpretation None      MDM   Final diagnoses:  Fever, unspecified fever cause         Junius Creamer, NP 02/01/16 2054  Veryl Speak, MD 02/01/16 2112

## 2016-01-31 NOTE — Discharge Instructions (Signed)
Today your evaluated for postop fever .  No specific source has been identified you.  Urine is well within normal parameters.  Her chest x-ray does not show any pneumonia or infiltrate .  Your abdominal x-ray shows no obstruction and your blood work is all within normal parameters.  Please monitor your temperature the next several days if you develop new symptoms please return for further evaluation   Fever, Adult A fever is an increase in the body's temperature. It is often defined as a temperature of 100 F (38C) or higher. Short mild or moderate fevers often have no long-term effects. They also often do not need treatment. Moderate or high fevers may make you feel uncomfortable. Sometimes, they can also be a sign of a serious illness or disease. The sweating that may happen with repeated fevers or fevers that last a while may also cause you to not have enough fluid in your body (dehydration). You can take your temperature with a thermometer to see if you have a fever. A measured temperature can change with:  Age.  Time of day.  Where the thermometer is placed:  Mouth (oral).  Rectum (rectal).  Ear (tympanic).  Underarm (axillary).  Forehead (temporal). HOME CARE Pay attention to any changes in your symptoms. Take these actions to help with your condition:  Take over-the-counter and prescription medicines only as told by your doctor. Follow the dosing instructions carefully.  If you were prescribed an antibiotic medicine, take it as told by your doctor. Do not stop taking the antibiotic even if you start to feel better.  Rest as needed.  Drink enough fluid to keep your pee (urine) clear or pale yellow.  Sponge yourself or bathe with room-temperature water as needed. This helps to lower your body temperature . Do not use ice water.  Do not wear too many blankets or heavy clothes. GET HELP IF:  You throw up (vomit).  You cannot eat or drink without throwing up.  You have  watery poop (diarrhea).  It hurts when you pee.  Your symptoms do not get better with treatment.  You have new symptoms.  You feel very weak. GET HELP RIGHT AWAY IF:  You are short of breath or have trouble breathing.  You are dizzy or you pass out (faint).  You feel confused.  You have signs of not having enough fluid in your body, such as:  A dry mouth.  Peeing less.  Looking pale.  You have very bad pain in your belly (abdomen).  You keep throwing up or having water poop.  You have a skin rash.  Your symptoms suddenly get worse.   This information is not intended to replace advice given to you by your health care provider. Make sure you discuss any questions you have with your health care provider.   Document Released: 04/11/2008 Document Revised: 03/24/2015 Document Reviewed: 08/27/2014 Elsevier Interactive Patient Education [employer].

## 2016-01-31 NOTE — ED Notes (Addendum)
Pt had gastric sleeve operation Tuesday and states he started developing a fever yesterday of 102. Denies nausea, vomiting and denied redness or swelling to surgical site. Temperature today at triage was 99.5 and has not taken any OTC meds. Pt still taking Cipro.

## 2016-02-02 LAB — URINE CULTURE: CULTURE: NO GROWTH

## 2016-02-04 ENCOUNTER — Ambulatory Visit (INDEPENDENT_AMBULATORY_CARE_PROVIDER_SITE_OTHER): Payer: Self-pay | Admitting: Family Medicine

## 2016-02-04 ENCOUNTER — Encounter: Payer: Self-pay | Admitting: Family Medicine

## 2016-02-04 VITALS — BP 148/92 | HR 88 | Temp 98.8°F | Resp 18 | Wt 384.0 lb

## 2016-02-04 DIAGNOSIS — Z4802 Encounter for removal of sutures: Secondary | ICD-10-CM

## 2016-02-04 DIAGNOSIS — Z9889 Other specified postprocedural states: Secondary | ICD-10-CM

## 2016-02-04 NOTE — Progress Notes (Signed)
Subjective:    Patient ID: UEL FRUIT, male    DOB: 08-16-1955, 60 y.o.   MRN: [id number]  HPI 12/14/15 Patient is a very pleasant 60 year old white male here today to establish care. Past medical history is significant for a DVT after surgery on his ankle in 2006. History of type 2 diabetes mellitus, morbid obesity, obstructive sleep apnea. He has chronic diastolic congestive heart failure with pitting edema in both legs which he treats with torsemide and compression hose. Patient is scheduled to have laparoscopic gastric sleeve surgery performed in [place] in one month. He is doing this because of insurance reasons and caused. I am not an advocate for this but the patient has already made up his mind. I had a long discussion today regarding follow-up and potential complications. He is due for a prostate exam, colonoscopy, Prevnar 13. However he is paying for everything out of pocket and he defers this at the present time as he is saving for his upcoming surgery.  AT that time, my plan was: We spent more than 25 minutes today discussing potential follow-up complications for his upcoming surgery. The first thing I'm concerned about is the necessary vitamins and minerals he will inquire after gastric bypass. Most patients require chronic repletion of vitamin B12, calcium, and iron. I recommended the patient discuss this with his surgeon and his nurses. If they provide him no follow-up, I will start him on replacements of these vitamins and minerals when he returns from his surgery in one month. My second concern is his risk of a DVT. The patient is at higher risk for DVT given his past medical history. Will also be recovering from the surgery was present even high risk for DVT. Furthermore he is flying [place] to [facility]. All of these are risk for DVT after surgery. I recommended he discuss this with the surgeon. I would at least recommend compression hose on the airplane, a full dose aspirin  although there is no good evidence based research on this, and frequently changing positions and standing and walking about  To improve circulation in his legs. The third concern is hypoglycemia after surgery. I anticipate that his food intake with diminish greatly after his surgery. We will likely need to discontinue Amaryl after surgery. I will obtain baseline lab work to determine how well controlled his blood sugars are so that I can give him an educated answer on what to do with his Amaryl perioperatively. He defers a colonoscopy, PSA, and Prevnar 13 at the present time  02/04/16 Patient had gastric bypass surgery on July 11. He was kept in the hospital for 1 day postoperatively. He returned home shortly thereafter. Surgery was performed in [place]. If complications. Monday he went to emergency room with a low-grade fever. It only occurred that one day it has not recurred since. He denies any symptoms of an infection. Urinalysis in the emergency room is clear. Chest x-ray showed a small left-sided pleural effusion but was otherwise clear. White blood cell count was normal. He did have a mild elevation in his ALT but there was otherwise no significant abnormalities on his lab work. He was diagnosed with a postoperative fever. Today he shows no signs or symptoms of a DVT. He has symmetric swelling in both legs distal to his knees but this is chronic. There is no erythema. There is no warmth. He has a negative Homans sign. He has had no further fevers. He is taking none of his medication. His  blood pressure today is slightly elevated. His blood sugars have been between 101 130 on no medication. He is here today for suture removal. There are 5 sites on his abdomen from his laparoscopic surgery. Each site has 1-3 sutures closing it. All the sutures are removed without difficulty. The wounds were then reinforced with Steri-Strips. There is no evidence of cellulitis Past Medical History  Diagnosis Date  . DVT  (deep venous thrombosis) ([place]) 2006    "after ankle surgery"  . Gout   . Obesity   . Essential hypertension   . Hypercholesteremia   . Chronic diastolic CHF (congestive heart failure) ([place])     a. 11/2013 Echo: EF 45-50%, mild LVH, gr 1 DD, mildly dil LA.  . OSA on CPAP   . Peripheral edema     chronic  . Clotting disorder (HCC)     deep vein thrombosis left leg  . Melanoma ([place])     left ear  . Shortness of breath   . Cellulitis and abscess of lower extremity 03/24/2014    RT LEG  . Type II diabetes mellitus ([place])   . GERD (gastroesophageal reflux disease)   . Arthritis     RA  . Bronchitis   . Cellulitis of right lower extremity     Recurrent   Past Surgical History  Procedure Laterality Date  . Hernia repair    . Tonsillectomy    . Skin cancer excision     Current Outpatient Prescriptions on File Prior to Visit  Medication Sig Dispense Refill  . albuterol (PROVENTIL) (2.5 MG/3ML) 0.083% nebulizer solution Take 3 mLs (2.5 mg total) by nebulization every 6 (six) hours as needed for wheezing or shortness of breath. (Patient not taking: Reported on 01/31/2016) 75 mL 12  . ciprofloxacin (CIPRO) 500 MG tablet Take 500 mg by mouth 2 (two) times daily.    Marland Kitchen glimepiride (AMARYL) 4 MG tablet Take 1 tablet (4 mg total) by mouth daily. (Patient not taking: Reported on 01/31/2016) 90 tablet 0  . HYDROcodone-acetaminophen (NORCO/VICODIN) 5-325 MG tablet Take 1 tablet by mouth every 8 (eight) hours as needed. (Patient not taking: Reported on 01/31/2016) 30 tablet 3  . ibuprofen (ADVIL,MOTRIN) 200 MG tablet Take 2 tablets (400 mg total) by mouth every 6 (six) hours as needed for moderate pain (gout). (Patient not taking: Reported on 01/31/2016) 30 tablet 0  . losartan (COZAAR) 100 MG tablet Take 1 tablet (100 mg total) by mouth daily. (Patient not taking: Reported on 01/31/2016) 90 tablet 1  . lovastatin (MEVACOR) 40 MG tablet Take 1 tablet (40 mg total) by mouth at bedtime. (Patient not taking:  Reported on 01/31/2016) 30 tablet 3  . metFORMIN (GLUCOPHAGE) 1000 MG tablet TAKE 1 TABLET BY MOUTH TWICE DAILY (Patient not taking: Reported on 01/31/2016) 180 tablet 0  . naproxen (NAPROSYN) 500 MG tablet TAKE ONE TABLET BY MOUTH TWICE DAILY (Patient not taking: Reported on 01/31/2016) 60 tablet 0  . Spacer/Aero-Holding Chambers (AEROCHAMBER PLUS FLO-VU LARGE) MISC Use spacer when using albuterol. 10 each 0  . torsemide (DEMADEX) 20 MG tablet TAKE ONE TABLET BY MOUTH ONCE TO TWICE DAILY (Patient not taking: Reported on 01/31/2016) 180 tablet 0  . valACYclovir (VALTREX) 500 MG tablet Take 1 tablet (500 mg total) by mouth 2 (two) times daily as needed (for fever blisters). For 5 days (Patient not taking: Reported on 01/31/2016) 20 tablet 0   No current facility-administered medications on file prior to visit.   Allergies  Allergen  Reactions  . Penicillins     Pt complained of chest tightness, pt very diaphoretic, pt chest, arms red, difficulty breathing.  Marland Kitchen Zosyn [Piperacillin Sod-Tazobactam So]    Social History   Social History  . Marital Status: Married    Spouse Name: N/A  . Number of Children: N/A  . Years of Education: N/A   Occupational History  . Not on file.   Social History Main Topics  . Smoking status: Never Smoker   . Smokeless tobacco: Never Used  . Alcohol Use: No  . Drug Use: No  . Sexual Activity: Not on file   Other Topics Concern  . Not on file   Social History Narrative   Family History  Problem Relation Age of Onset  . Stroke Mother   . Hypertension Mother   . Asthma Mother   . Hypertension Father   . Asthma Sister   . Asthma Sister   . Asthma Daughter   . Tuberculosis Mother       Review of Systems  All other systems reviewed and are negative.      Objective:   Physical Exam  Constitutional: He is oriented to person, place, and time. He appears well-developed and well-nourished. No distress.  HENT:  Head: Normocephalic and atraumatic.    Eyes: Conjunctivae and EOM are normal. Pupils are equal, round, and reactive to light.  Neck: Neck supple. No JVD present. No thyromegaly present.  Cardiovascular: Normal rate, regular rhythm and normal heart sounds.   No murmur heard. Pulmonary/Chest: Effort normal and breath sounds normal. No respiratory distress. He has no wheezes. He has no rales.  Abdominal: Soft. Bowel sounds are normal. He exhibits no distension. There is no tenderness. There is no rebound and no guarding.  Musculoskeletal: He exhibits edema.  Lymphadenopathy:    He has no cervical adenopathy.  Neurological: He is alert and oriented to person, place, and time. He displays normal reflexes. No cranial nerve deficit. He exhibits normal muscle tone. Coordination normal.  Skin: Skin is warm and dry. No rash noted. He is not diaphoretic. No erythema. No pallor.  Vitals reviewed.         Assessment & Plan:  Post-operative state  Visit for suture removal  Blood pressure is elevated. I would like the patient to resume losartan. Also recommended vitamin B12 1000 g daily, iron sulfate 325 mg daily, and I recommended a regular multivitamin to ensure adequate calcium and magnesium absorption. He was given no vitamin recommendations by the doctors who performed the surgery in [place]. At the present time he does not require any of his diabetic medications. He will check his blood sugars frequently. If his fasting blood sugars rise greater than 130 or if his two-hour postprandial sugars rise greater than 160, I would resume metformin. Otherwise I will like to check lab work in 3 months

## 2016-02-12 ENCOUNTER — Emergency Department (HOSPITAL_COMMUNITY): Payer: Self-pay

## 2016-02-12 ENCOUNTER — Encounter (HOSPITAL_COMMUNITY): Payer: Self-pay

## 2016-02-12 ENCOUNTER — Inpatient Hospital Stay (HOSPITAL_COMMUNITY)
Admission: EM | Admit: 2016-02-12 | Discharge: 2016-02-12 | DRG: 394 | Disposition: A | Discharge status: Other Acute Inpatient Hospital | Payer: Self-pay | Attending: Internal Medicine | Admitting: Internal Medicine

## 2016-02-12 DIAGNOSIS — E1165 Type 2 diabetes mellitus with hyperglycemia: Secondary | ICD-10-CM | POA: Diagnosis present

## 2016-02-12 DIAGNOSIS — F329 Major depressive disorder, single episode, unspecified: Secondary | ICD-10-CM | POA: Diagnosis present

## 2016-02-12 DIAGNOSIS — K9589 Other complications of other bariatric procedure: Principal | ICD-10-CM | POA: Diagnosis present

## 2016-02-12 DIAGNOSIS — G4733 Obstructive sleep apnea (adult) (pediatric): Secondary | ICD-10-CM | POA: Diagnosis present

## 2016-02-12 DIAGNOSIS — I110 Hypertensive heart disease with heart failure: Secondary | ICD-10-CM | POA: Diagnosis present

## 2016-02-12 DIAGNOSIS — M109 Gout, unspecified: Secondary | ICD-10-CM | POA: Diagnosis present

## 2016-02-12 DIAGNOSIS — I2699 Other pulmonary embolism without acute cor pulmonale: Secondary | ICD-10-CM

## 2016-02-12 DIAGNOSIS — E7800 Pure hypercholesterolemia, unspecified: Secondary | ICD-10-CM | POA: Diagnosis present

## 2016-02-12 DIAGNOSIS — M069 Rheumatoid arthritis, unspecified: Secondary | ICD-10-CM | POA: Diagnosis present

## 2016-02-12 DIAGNOSIS — D649 Anemia, unspecified: Secondary | ICD-10-CM | POA: Diagnosis present

## 2016-02-12 DIAGNOSIS — I10 Essential (primary) hypertension: Secondary | ICD-10-CM | POA: Diagnosis present

## 2016-02-12 DIAGNOSIS — Z9989 Dependence on other enabling machines and devices: Secondary | ICD-10-CM

## 2016-02-12 DIAGNOSIS — K3189 Other diseases of stomach and duodenum: Secondary | ICD-10-CM

## 2016-02-12 DIAGNOSIS — Z6841 Body Mass Index (BMI) 40.0 and over, adult: Secondary | ICD-10-CM

## 2016-02-12 DIAGNOSIS — IMO0002 Reserved for concepts with insufficient information to code with codable children: Secondary | ICD-10-CM | POA: Diagnosis present

## 2016-02-12 DIAGNOSIS — R198 Other specified symptoms and signs involving the digestive system and abdomen: Secondary | ICD-10-CM

## 2016-02-12 DIAGNOSIS — Z86718 Personal history of other venous thrombosis and embolism: Secondary | ICD-10-CM

## 2016-02-12 DIAGNOSIS — I5032 Chronic diastolic (congestive) heart failure: Secondary | ICD-10-CM | POA: Diagnosis present

## 2016-02-12 DIAGNOSIS — Y848 Other medical procedures as the cause of abnormal reaction of the patient, or of later complication, without mention of misadventure at the time of the procedure: Secondary | ICD-10-CM | POA: Diagnosis present

## 2016-02-12 DIAGNOSIS — K668 Other specified disorders of peritoneum: Secondary | ICD-10-CM | POA: Diagnosis present

## 2016-02-12 DIAGNOSIS — K219 Gastro-esophageal reflux disease without esophagitis: Secondary | ICD-10-CM | POA: Diagnosis present

## 2016-02-12 DIAGNOSIS — Z85820 Personal history of malignant melanoma of skin: Secondary | ICD-10-CM

## 2016-02-12 LAB — CBC WITH DIFFERENTIAL/PLATELET
BASOS PCT: 0 %
Basophils Absolute: 0 10*3/uL (ref 0.0–0.1)
EOS ABS: 0.1 10*3/uL (ref 0.0–0.7)
EOS PCT: 1 %
HEMATOCRIT: 36.8 % — AB (ref 39.0–52.0)
HEMOGLOBIN: 12.3 g/dL — AB (ref 13.0–17.0)
Lymphocytes Relative: 17 %
Lymphs Abs: 1.5 10*3/uL (ref 0.7–4.0)
MCH: 28.1 pg (ref 26.0–34.0)
MCHC: 33.4 g/dL (ref 30.0–36.0)
MCV: 84.2 fL (ref 78.0–100.0)
Monocytes Absolute: 1 10*3/uL (ref 0.1–1.0)
Monocytes Relative: 12 %
NEUTROS PCT: 70 %
Neutro Abs: 5.8 10*3/uL (ref 1.7–7.7)
Platelets: 280 10*3/uL (ref 150–400)
RBC: 4.37 MIL/uL (ref 4.22–5.81)
RDW: 13.2 % (ref 11.5–15.5)
WBC: 8.3 10*3/uL (ref 4.0–10.5)

## 2016-02-12 LAB — URINALYSIS, ROUTINE W REFLEX MICROSCOPIC
Bilirubin Urine: NEGATIVE
GLUCOSE, UA: NEGATIVE mg/dL
Hgb urine dipstick: NEGATIVE
Ketones, ur: 15 mg/dL — AB
LEUKOCYTES UA: NEGATIVE
Nitrite: NEGATIVE
PH: 6 (ref 5.0–8.0)
Protein, ur: NEGATIVE mg/dL
SPECIFIC GRAVITY, URINE: 1.043 — AB (ref 1.005–1.030)

## 2016-02-12 LAB — BASIC METABOLIC PANEL
Anion gap: 9 (ref 5–15)
BUN: 11 mg/dL (ref 6–20)
CALCIUM: 8.7 mg/dL — AB (ref 8.9–10.3)
CO2: 23 mmol/L (ref 22–32)
CREATININE: 1.15 mg/dL (ref 0.61–1.24)
Chloride: 99 mmol/L — ABNORMAL LOW (ref 101–111)
Glucose, Bld: 176 mg/dL — ABNORMAL HIGH (ref 65–99)
Potassium: 4 mmol/L (ref 3.5–5.1)
SODIUM: 131 mmol/L — AB (ref 135–145)

## 2016-02-12 LAB — CBG MONITORING, ED: Glucose-Capillary: 123 mg/dL — ABNORMAL HIGH (ref 65–99)

## 2016-02-12 LAB — D-DIMER, QUANTITATIVE (NOT AT ARMC): D DIMER QUANT: 3.21 ug{FEU}/mL — AB (ref 0.00–0.50)

## 2016-02-12 LAB — I-STAT TROPONIN, ED: Troponin i, poc: 0 ng/mL (ref 0.00–0.08)

## 2016-02-12 LAB — BRAIN NATRIURETIC PEPTIDE: B NATRIURETIC PEPTIDE 5: 99.7 pg/mL (ref 0.0–100.0)

## 2016-02-12 MED ORDER — INSULIN ASPART 100 UNIT/ML ~~LOC~~ SOLN: 0.0000 [IU] | Freq: Three times a day (TID) | SUBCUTANEOUS | Status: DC

## 2016-02-12 MED ORDER — ONDANSETRON HCL 4 MG/2ML IJ SOLN: 4.0000 mg | Freq: Three times a day (TID) | INTRAMUSCULAR | Status: DC | PRN

## 2016-02-12 MED ORDER — HEPARIN (PORCINE) IN NACL 100-0.45 UNIT/ML-% IJ SOLN
1800.0000 [IU]/h | INTRAMUSCULAR | Status: DC
  Administered 2016-02-12: 1800 [IU]/h via INTRAVENOUS
  Filled 2016-02-12 (×2): qty 250

## 2016-02-12 MED ORDER — IOPAMIDOL (ISOVUE-370) INJECTION 76%
INTRAVENOUS | Status: AC
  Administered 2016-02-12: 100 mL
  Filled 2016-02-12: qty 100

## 2016-02-12 MED ORDER — SODIUM CHLORIDE 0.9 % IV SOLN
500.0000 mg | Freq: Four times a day (QID) | INTRAVENOUS | Status: DC
  Administered 2016-02-12: 500 mg via INTRAVENOUS
  Filled 2016-02-12 (×3): qty 500

## 2016-02-12 MED ORDER — METRONIDAZOLE IN NACL 5-0.79 MG/ML-% IV SOLN: 500.0000 mg | Freq: Once | INTRAVENOUS | Status: DC

## 2016-02-12 MED ORDER — HYDROMORPHONE HCL 1 MG/ML IJ SOLN: 1.0000 mg | INTRAMUSCULAR | Status: DC | PRN

## 2016-02-12 MED ORDER — SODIUM CHLORIDE 0.9 % IV BOLUS (SEPSIS)
1000.0000 mL | Freq: Once | INTRAVENOUS | Status: AC
  Administered 2016-02-12: 1000 mL via INTRAVENOUS

## 2016-02-12 MED ORDER — SODIUM CHLORIDE 0.9 % IV SOLN
INTRAVENOUS | Status: DC
  Administered 2016-02-12: 100 mL/h via INTRAVENOUS

## 2016-02-12 MED ORDER — HEPARIN BOLUS VIA INFUSION
3000.0000 [IU] | Freq: Once | INTRAVENOUS | Status: AC
  Administered 2016-02-12: 3000 [IU] via INTRAVENOUS
  Filled 2016-02-12: qty 3000

## 2016-02-12 MED ORDER — ALBUTEROL SULFATE (2.5 MG/3ML) 0.083% IN NEBU: 2.5000 mg | INHALATION_SOLUTION | RESPIRATORY_TRACT | Status: DC | PRN

## 2016-02-12 MED ORDER — SODIUM CHLORIDE 0.9 % IV SOLN
500.0000 mg | Freq: Three times a day (TID) | INTRAVENOUS | Status: DC
  Filled 2016-02-12 (×3): qty 500

## 2016-02-12 MED ORDER — ONDANSETRON HCL 4 MG/2ML IJ SOLN: 4.0000 mg | Freq: Four times a day (QID) | INTRAMUSCULAR | Status: DC | PRN

## 2016-02-12 MED ORDER — SODIUM CHLORIDE 0.9 % IV BOLUS (SEPSIS)
500.0000 mL | Freq: Once | INTRAVENOUS | Status: AC
  Administered 2016-02-12: 500 mL via INTRAVENOUS

## 2016-02-12 MED ORDER — CIPROFLOXACIN IN D5W 400 MG/200ML IV SOLN: 400.0000 mg | Freq: Once | INTRAVENOUS | Status: DC

## 2016-02-12 NOTE — ED Triage Notes (Signed)
Pt states waking in night with cough and coughing up clear phlegm. Pt states gastric sleave on July 11 and seen in ED on [place] 17.

## 2016-02-12 NOTE — Progress Notes (Signed)
Pharmacy Antibiotic Note Michael Martinez is a 60 y.o. male admitted on 02/12/2016 with IAA/perforation.  Pharmacy has been consulted for Primaxin dosing in setting of anaphylactic Zosyn/PCN allergy.  Has tolerated Primaxin in 2015.   Plan: 1. Begin Primaxin 500 mg IV every 6 hours  Height: 6' (182.9 cm) Weight: (!) 359 lb 12.8 oz (163.2 kg) IBW/kg (Calculated) : 77.6  Temp (24hrs), Avg:98.6 F (37 C), Min:98.6 F (37 C), Max:98.6 F (37 C)   Recent Labs Lab 02/12/16 1042  WBC 8.3  CREATININE 1.15    Estimated Creatinine Clearance: 105.3 mL/min (by C-G formula based on SCr of 1.15 mg/dL).    Allergies  Allergen Reactions  . Penicillins     Pt complained of chest tightness, pt very diaphoretic, pt chest, arms red, difficulty breathing.  Marland Kitchen Zosyn [Piperacillin Sod-Tazobactam So]     Antimicrobials this admission: 7/29 Primaxin >>   Dose adjustments this admission: n/a  Microbiology results: Px   Thank you for allowing pharmacy to be a part of this patient's care.  Vincenza Hews, PharmD, BCPS 02/12/2016, 2:29 PM Pager: [phone]

## 2016-02-12 NOTE — Consult Note (Signed)
Reason for Consult:sleeve gastrectomy leak Referring Physician: Dr [name] is an 60 y.o. male.  HPI: 60 yo morbidly obese wm with OSA on cpap, DM2, OA, h/o diastolic heart failure, HTN, remote h/o LE DVT who underwent laparoscopic sleeve gastrectomy in [place] on 7/11 comes with ongoing fevers, night sweats, and productive cough. He underwent PE CT scan today which showed a small PE in RLL but was also found to have small amount of free air at upper part of gastric sleeve near GE junction. Surgery was consulted. He denies abd pain, n/v/gerd. He states he has been drinking liquids without difficulty. He was told to stay on clear liquids for 2 weeks. He is supposed to advance to protein shakes on Monday. He came to the ED on this past Monday for temp to 102. By time he was seen he was afebrile. ua negative. cxr showed LLL effusion. He was discharged from ED and followed up with his PCP.   He states he was on a liquid diet for 2 weeks before sleeve gastrectomy and could eat 1 solid piece of protein each day. Underwent sleeve on 7/11. States he had only 1 injection of blood thinner. On POD 2 he states he underwent swallow test and showed no leak (has copy) and was started on water. He has been only drinking water, juice, gatorade, and broth. Getting in more than 64 oz per day. He states he [place] to states on 7[place]15. He was not sent out on any blood thinners. States he never saw a nutritionist as part of the program  He has not taken any oral diabetes meds or diuretics since surgery. He reports a 55 lb weight loss since starting his preop diet.   He has no copy of his operative note  Past Medical History:  Diagnosis Date  . Arthritis    RA  . Bronchitis   . Cellulitis and abscess of lower extremity 03/24/2014   RT LEG  . Cellulitis of right lower extremity    Recurrent  . Chronic diastolic CHF (congestive heart failure) ([place])    a. 11/2013 Echo: EF 45-50%, mild LVH, gr 1 DD,  mildly dil LA.  Marland Kitchen Clotting disorder (HCC)    deep vein thrombosis left leg  . DVT (deep venous thrombosis) ([place]) 2006   "after ankle surgery"  . Essential hypertension   . GERD (gastroesophageal reflux disease)   . Gout   . Hypercholesteremia   . Melanoma ([place])    left ear  . Obesity   . OSA on CPAP   . Peripheral edema    chronic  . Shortness of breath   . Type II diabetes mellitus ([place])     Past Surgical History:  Procedure Laterality Date  . gastric sleave    . HERNIA REPAIR    . SKIN CANCER EXCISION    . TONSILLECTOMY      Family History  Problem Relation Age of Onset  . Stroke Mother   . Hypertension Mother   . Asthma Mother   . Tuberculosis Mother   . Hypertension Father   . Asthma Sister   . Asthma Sister   . Asthma Daughter     Social History:  reports that he has never smoked. He has never used smokeless tobacco. He reports that he does not drink alcohol or use drugs.  Allergies:  Allergies  Allergen Reactions  . Penicillins     Pt complained of chest tightness, pt very diaphoretic, pt  chest, arms red, difficulty breathing.  Marland Kitchen Zosyn [Piperacillin Sod-Tazobactam So]     Medications: I have reviewed the patient's current medications.  Results for orders placed or performed during the hospital encounter of 02/12/16 (from the past 48 hour(s))  Basic metabolic panel     Status: Abnormal   Collection Time: 02/12/16 10:42 AM  Result Value Ref Range   Sodium 131 (L) 135 - 145 mmol/L   Potassium 4.0 3.5 - 5.1 mmol/L   Chloride 99 (L) 101 - 111 mmol/L   CO2 23 22 - 32 mmol/L   Glucose, Bld 176 (H) 65 - 99 mg/dL   BUN 11 6 - 20 mg/dL   Creatinine, Ser 1.15 0.61 - 1.24 mg/dL   Calcium 8.7 (L) 8.9 - 10.3 mg/dL   GFR calc non Af Amer >60 >60 mL/min   GFR calc Af Amer >60 >60 mL/min    Comment: (NOTE) The eGFR has been calculated using the CKD EPI equation. This calculation has not been validated in all clinical situations. eGFR's persistently <60 mL/min  signify possible Chronic Kidney Disease.    Anion gap 9 5 - 15  CBC with Differential     Status: Abnormal   Collection Time: 02/12/16 10:42 AM  Result Value Ref Range   WBC 8.3 4.0 - 10.5 K/uL   RBC 4.37 4.22 - 5.81 MIL/uL   Hemoglobin 12.3 (L) 13.0 - 17.0 g/dL   HCT 36.8 (L) 39.0 - 52.0 %   MCV 84.2 78.0 - 100.0 fL   MCH 28.1 26.0 - 34.0 pg   MCHC 33.4 30.0 - 36.0 g/dL   RDW 13.2 11.5 - 15.5 %   Platelets 280 150 - 400 K/uL   Neutrophils Relative % 70 %   Neutro Abs 5.8 1.7 - 7.7 K/uL   Lymphocytes Relative 17 %   Lymphs Abs 1.5 0.7 - 4.0 K/uL   Monocytes Relative 12 %   Monocytes Absolute 1.0 0.1 - 1.0 K/uL   Eosinophils Relative 1 %   Eosinophils Absolute 0.1 0.0 - 0.7 K/uL   Basophils Relative 0 %   Basophils Absolute 0.0 0.0 - 0.1 K/uL  D-dimer, quantitative     Status: Abnormal   Collection Time: 02/12/16 10:42 AM  Result Value Ref Range   D-Dimer, Quant 3.21 (H) 0.00 - 0.50 ug/mL-FEU    Comment: (NOTE) At the manufacturer cut-off of 0.50 ug/mL FEU, this assay has been documented to exclude PE with a sensitivity and negative predictive value of 97 to 99%.  At this time, this assay has not been approved by the FDA to exclude DVT/VTE. Results should be correlated with clinical presentation.   Brain natriuretic peptide     Status: None   Collection Time: 02/12/16 10:43 AM  Result Value Ref Range   B Natriuretic Peptide 99.7 0.0 - 100.0 pg/mL  I-stat troponin, ED     Status: None   Collection Time: 02/12/16 10:53 AM  Result Value Ref Range   Troponin i, poc 0.00 0.00 - 0.08 ng/mL   Comment 3            Comment: Due to the release kinetics of cTnI, a negative result within the first hours of the onset of symptoms does not rule out myocardial infarction with certainty. If myocardial infarction is still suspected, repeat the test at appropriate intervals.   Urinalysis, Routine w reflex microscopic     Status: Abnormal   Collection Time: 02/12/16  3:18 PM  Result  Value Ref  Range   Color, Urine YELLOW YELLOW   APPearance CLEAR CLEAR   Specific Gravity, Urine 1.043 (H) 1.005 - 1.030   pH 6.0 5.0 - 8.0   Glucose, UA NEGATIVE NEGATIVE mg/dL   Hgb urine dipstick NEGATIVE NEGATIVE   Bilirubin Urine NEGATIVE NEGATIVE   Ketones, ur 15 (A) NEGATIVE mg/dL   Protein, ur NEGATIVE NEGATIVE mg/dL   Nitrite NEGATIVE NEGATIVE   Leukocytes, UA NEGATIVE NEGATIVE    Comment: MICROSCOPIC NOT DONE ON URINES WITH NEGATIVE PROTEIN, BLOOD, LEUKOCYTES, NITRITE, OR GLUCOSE <1000 mg/dL.    Dg Chest 2 View  Result Date: 02/12/2016 CLINICAL DATA:  Intermittent cough and shortness of breath EXAM: CHEST  2 VIEW COMPARISON:  January 31, 2016 FINDINGS: The heart size and mediastinal contours are within normal limits. Both lungs are clear. The visualized skeletal structures are unremarkable. IMPRESSION: No active cardiopulmonary disease. Electronically Signed   By: Dorise Bullion III M.D   On: 02/12/2016 11:00  Ct Angio Chest Pe W/cm &/or Wo Cm  Result Date: 02/12/2016 CLINICAL DATA:  Gastric sleeve surgery 2 weeks ago. Productive cough and shortness of breath. EXAM: CT ANGIOGRAPHY CHEST WITH CONTRAST TECHNIQUE: Multidetector CT imaging of the chest was performed using the standard protocol during bolus administration of intravenous contrast. Multiplanar CT image reconstructions and MIPs were obtained to evaluate the vascular anatomy. CONTRAST:  100 mL Isovue 370 COMPARISON:  Two-view chest x-ray from the same day. Two-view chest x-ray 01/31/2016. FINDINGS: Cardiovascular: The heart size is normal. No significant pericardial effusion is present. Calcification is present at the left main coronary artery. Mediastinum/Nodes: Pulmonary arterial opacification is excellent. A filling defect is present in the lateral segmental artery of the right lower lobe. No other significant filling defects are present. No central emboli or left-sided emboli are present. There is no right heart strain.  Lungs/Pleura: A 5 mm nodule is present in the posterior lateral aspect of the right lower lobe. A 4 mm nodule is present along the major fissure on the right. A peripheral 4 mm nodules also present in the superior segment of the right lower lobe. No significant left-sided nodules are present. A small left pleural effusion and associated airspace disease are present, as previously seen. Upper Abdomen: Gastric bypass surgery is noted. There is a perforation with free air in the lesser sac. This extends along the GE junction on the left. Diffuse inflammatory changes surround the free air. There is no discrete abscess. Inflammatory changes within the body and tail the pancreas are likely secondary. The solid organs are within normal limits. Musculoskeletal: Bone windows demonstrate no focal lytic or blastic lesions. Fusion of anterior osteophytes within the thoracic spine is noted. Review of the MIP images confirms the above findings. IMPRESSION: 1. Free air adjacent to the gastric bypass surgery compatible with perforation of the anastomosis. Free air extends up to the GE junction on the left. 2. Diffuse inflammatory changes about the lesser sac and pancreas. This likely represents secondary inflammation of the pancreas. 3. Segmental pulmonary embolus within the lateral segment of the right lower lobe. 4. Three sub 5 mm nodules in the right lower lobe. These may be inflammatory. No follow-up needed if patient is low-risk (and has no known or suspected primary neoplasm). Non-contrast chest CT can be considered in 12 months if patient is high-risk. This recommendation follows the consensus statement: Guidelines for Management of Incidental Pulmonary Nodules Detected on CT Images:From the Fleischner Society 2017; published online before print (10.1148/radiol.[id number]). 5. Small left pleural effusion  and associated airspace disease. This is likely secondary to subdiaphragmatic inflammation. 6. Coronary artery disease  with dense calcification at the left main coronary artery. These results were called by telephone at the time of interpretation on 02/12/2016 at 1:44 pm to Dr. Arlean Hopping , who verbally acknowledged these results. Electronically Signed   By: San Morelle M.D.   On: 02/12/2016 13:45   Review of Systems  Constitutional: Positive for fever and weight loss.  HENT: Negative for nosebleeds.   Eyes: Negative for blurred vision.  Respiratory: Positive for cough and sputum production. Negative for shortness of breath.   Cardiovascular: Positive for leg swelling (but better since weight loss surgery). Negative for chest pain, palpitations, orthopnea and PND.       Denies DOE  Gastrointestinal: Negative for abdominal pain, heartburn, nausea and vomiting.  Genitourinary: Negative for dysuria and hematuria.  Musculoskeletal: Negative.   Skin: Negative for itching and rash.  Neurological: Negative for dizziness, focal weakness, seizures, loss of consciousness and headaches.       Denies TIAs, amaurosis fugax  Endo/Heme/Allergies: Does not bruise/bleed easily.  Psychiatric/Behavioral: The patient is not nervous/anxious.    Blood pressure 114/60, pulse 92, temperature 98.6 F (37 C), temperature source Oral, resp. rate 16, height 6' (1.829 m), weight (!) 163.2 kg (359 lb 12.8 oz), SpO2 97 %. Physical Exam  Vitals reviewed. Constitutional: He is oriented to person, place, and time. He appears well-developed and well-nourished. No distress.  Morbidly obese, not ill appearing/ nontoxic  HENT:  Head: Normocephalic and atraumatic.  Right Ear: External ear normal.  Left Ear: External ear normal.  Eyes: Conjunctivae are normal. No scleral icterus.  Neck: Normal range of motion. Neck supple. No tracheal deviation present. No thyromegaly present.  Cardiovascular: Normal rate and normal heart sounds.   Respiratory: Effort normal and breath sounds normal. No stridor. No respiratory distress. He has no  wheezes.  GI: Soft. He exhibits no distension. There is no tenderness. There is no rebound.    Old mini-midline incision; recent healing trocar sites - no infection; soft, obese, nt, no rebound/guarding.   Musculoskeletal: He exhibits no edema or tenderness.  Lymphadenopathy:    He has no cervical adenopathy.  Neurological: He is alert and oriented to person, place, and time. He exhibits normal muscle tone.  Skin: Skin is warm and dry. No rash noted. He is not diaphoretic. No erythema. No pallor.  Evidence of chronic b/l LE swelling with skin changes; trace edema; no open sores/ no calf tenderness  Psychiatric: He has a normal mood and affect. His behavior is normal. Judgment and thought content normal.    Assessment/Plan: S/p Lap sleeve gastrectomy in [place] 7/11 Postoperative leak Acute RLL PE H/o dvt osa on cpap Dm 2  HTN H/o diastolic heart failure Chronic dyspnea  He has a proximal leak from his sleeve gastrectomy. He is nontoxic appearing. He is not tachycardiac. His vitals are stable. His abdominal exam is stable without tenderness, rebound/guarding.   He doesn't have any significant free fluid in lesser sac or fluid collection. He has no gross widespread free air. He doesn't need operative drainage.  His sleeve leak would be best managed by either a long gastric stent or double pigtail catheter (one end intra-luminal in sleeve, other end thru area of perforation). He would be best managed by a intensive endoscopist. Need to r/o acute angulation of distal sleeve to account for leak.  Unfortunately our health system does not have that capability. Did discuss  with Dr Oletta Lamas in GI who agrees that no one locally is able to do this.   Explained extensively to pt and family that sleeve leaks are uncommon and can't be fixed quickly due to the nature of the anatomy. Getting a leak to heal generally requires several months, potential numerous procedures/interventions, and/or  hospitalizations.   Spoke with Dr Shawna Orleans at [facility] who has graciously accepted pt in transfer. Family agrees with transfer   Maintain NPO Hep gtt for PE IV abx for gastric leak Will send imaging on disc.  Will need nutrition status addressed at [facility] and vitamin supplementation  Family updated. Pt is medically stable for transfer.  Total time 1.5 hrs - evaluation, exam, chart review, and coordination care.   Leighton Ruff. Redmond Pulling, MD, FACS General, Bariatric, & Minimally Invasive Surgery [facility] Surgery, [place]   [facility] M 02/12/2016, 5:42 PM

## 2016-02-12 NOTE — ED Provider Notes (Signed)
Complains of cough productive of white sputum for approximately one week. No orthopnea. Patient suffers from chronic bilateral leg edema which she states is improved over the past week No chest pain. No fever. Denies dyspnea. On exam obese, no distress neck without JVD lungs clear auscultation heart regular rate and rhythm abdomen obese, nontender. Bilateral lower extremities with trace pretibial pitting edema   Orlie Dakin, MD 02/12/16 1116

## 2016-02-12 NOTE — ED Provider Notes (Signed)
[facility] DEPT Provider Note   CSN: [id number] Arrival date & time: 02/12/16  [id number]  First Provider Contact:  First MD Initiated Contact with Patient 02/12/16 [phone]        History   Chief Complaint Chief Complaint  Patient presents with  . Cough    HPI Michael Martinez is a 60 y.o. male.  HPI   Michael Martinez is a 60 y.o. male, with a history of Obesity, GERD, and DVT, presenting to the ED with a productive cough with clear sputum. Has been having night sweats,  soaking through 4 tshirts a night. States he wakes up frequently during the night with dry mouth. Also endorses decreased energy, occasional exertional shortness of breath, and minor, bilateral lower extremity edema.  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 Began to have shortness of breath and fever (Tmax 102) and was seen July 16 at [facility], pleural effusion found in the base of left lung. Had the surgery done in [place], [place] by Dr. Kizzie Bane. Has been on a liquid diet, supposed to advance to puree next week.   History of DVT in the left leg. Still having bowel movements and passing gas.  Denies fever/chills, N/V, chest pain, hematochezia/melena, abdominal pain, or any other complaints. Has not taken his Losartan for two days bc his home BP has been running around 126/74.  Past Medical History:  Diagnosis Date  . Arthritis    RA  . Bronchitis   . Cellulitis and abscess of lower extremity 03/24/2014   RT LEG  . Cellulitis of right lower extremity    Recurrent  . Chronic diastolic CHF (congestive heart failure) ([place])    a. 11/2013 Echo: EF 45-50%, mild LVH, gr 1 DD, mildly dil LA.  Marland Kitchen Clotting disorder (HCC)    deep vein thrombosis left leg  . DVT (deep venous thrombosis) ([place]) 2006   "after ankle surgery"  . Essential hypertension   . GERD (gastroesophageal reflux disease)   . Gout   . Hypercholesteremia   . Melanoma ([place])    left ear  . Obesity   . OSA on CPAP   . Peripheral edema    chronic  . Shortness of breath    . Type II diabetes mellitus [facility])     Patient Active Problem List   Diagnosis Date Noted  . Intra-abdominal free air of unknown etiology 02/12/2016  . Essential hypertension   . Hypercholesteremia   . Shortness of breath 08/28/2015  . Influenza 08/28/2015  . Acute bronchitis with bronchospasm 08/26/2015  . Edema 03/01/2015  . Chronic diastolic CHF (congestive heart failure) ([place]) 11/12/2014  . Cellulitis 03/29/2014  . Fever blister 03/25/2014  . Cellulitis of right leg 03/24/2014  . Leg swelling 12/04/2013  . Upper airway cough syndrome 12/03/2013  . Hyperlipemia 11/06/2013  . SOB (shortness of breath) 11/06/2013  . Diabetes type 2, uncontrolled ([place]) 11/06/2013  . HTN (hypertension) 10/12/2012  . Cellulitis of right anterior lower leg 10/12/2012  . Morbid obesity ([place]) 10/12/2012  . OSA on CPAP 10/12/2012    Past Surgical History:  Procedure Laterality Date  . gastric sleave    . HERNIA REPAIR    . SKIN CANCER EXCISION    . TONSILLECTOMY         Home Medications    Prior to Admission medications   Medication Sig Start Date End Date Taking? Authorizing Provider  albuterol (PROVENTIL) (2.5 MG/3ML) 0.083% nebulizer solution Take 3 mLs (2.5 mg total) by nebulization every 6 (six) hours  as needed for wheezing or shortness of breath. 08/31/15  Yes Loleta Chance, MD  HYDROcodone-acetaminophen (NORCO/VICODIN) 5-325 MG tablet Take 1 tablet by mouth every 8 (eight) hours as needed. 05/04/15  Yes Ashok Norris, MD  losartan (COZAAR) 100 MG tablet Take 1 tablet (100 mg total) by mouth daily. 01/06/16  Yes Susy Frizzle, MD  metFORMIN (GLUCOPHAGE) 1000 MG tablet TAKE 1 TABLET BY MOUTH TWICE DAILY 10/06/15  Yes Ashok Norris, MD  Multiple Vitamins-Minerals (MULTIVITAMIN WITH MINERALS) tablet Take 1 tablet by mouth daily.   Yes Historical Provider, MD  OVER THE COUNTER MEDICATION Place 1 patch onto the skin daily. Vitamin B 12 patch   Yes Historical Provider, MD   Spacer/Aero-Holding Chambers (AEROCHAMBER PLUS FLO-VU LARGE) MISC Use spacer when using albuterol. 08/31/15  Yes Loleta Chance, MD  torsemide (DEMADEX) 20 MG tablet TAKE ONE TABLET BY MOUTH ONCE TO TWICE DAILY 11/03/15  Yes Ashok Norris, MD  valACYclovir (VALTREX) 500 MG tablet Take 1 tablet (500 mg total) by mouth 2 (two) times daily as needed (for fever blisters). For 5 days 11/30/15  Yes Ashok Norris, MD  glimepiride (AMARYL) 4 MG tablet Take 1 tablet (4 mg total) by mouth daily. Patient not taking: Reported on 01/31/2016 12/16/15   Susy Frizzle, MD  lovastatin (MEVACOR) 40 MG tablet Take 1 tablet (40 mg total) by mouth at bedtime. Patient not taking: Reported on 01/31/2016 05/04/15   Ashok Norris, MD    Family History Family History  Problem Relation Age of Onset  . Stroke Mother   . Hypertension Mother   . Asthma Mother   . Tuberculosis Mother   . Hypertension Father   . Asthma Sister   . Asthma Sister   . Asthma Daughter     Social History Social History  Substance Use Topics  . Smoking status: Never Smoker  . Smokeless tobacco: Never Used  . Alcohol use No     Allergies   Penicillins and Zosyn [piperacillin sod-tazobactam so]   Review of Systems Review of Systems  Constitutional: Negative for chills and fever.  Respiratory: Positive for cough and shortness of breath (occasional, exertional).   Cardiovascular: Negative for chest pain.  Gastrointestinal: Negative for abdominal pain, blood in stool, constipation, diarrhea, nausea and vomiting.  Genitourinary: Negative for difficulty urinating and dysuria.  Musculoskeletal: Negative for back pain.  Skin: Negative for color change and pallor.  Neurological: Negative for dizziness and light-headedness.  All other systems reviewed and are negative.    Physical Exam Updated Vital Signs Pulse 95   Temp 98.6 F (37 C) (Oral)   Resp 16   Ht 6' (1.829 m)   Wt (!) 173.3 kg   SpO2 97%   BMI 51.81 kg/m    Physical Exam  Constitutional: He appears well-developed and well-nourished. No distress.  HENT:  Head: Normocephalic and atraumatic.  Eyes: Conjunctivae are normal.  Neck: Neck supple.  Cardiovascular: Normal rate, regular rhythm, normal heart sounds and intact distal pulses.   Pulmonary/Chest: He has wheezes in the right lower field.  Some increased work of breathing, speaks in full sentences, but gets winded and has to pause in between sentences.   Abdominal: Soft. There is no tenderness. There is no guarding.  Morbidly obese abdomen. 5 small surgical wounds on the left upper abdomen. Edges well approximated. Only one with an area of erythema immediately surrounding the wound edges. No discharge or active hemorrhage. The areas are not tender or swollen.  Musculoskeletal: He exhibits edema.  He exhibits no tenderness.  Minor bilateral lower extremity edema. No tenderness, erythema, or any other abnormalities.  Lymphadenopathy:    He has no cervical adenopathy.  Neurological: He is alert.  Skin: Skin is warm and dry. He is not diaphoretic.  Psychiatric: He has a normal mood and affect. His behavior is normal.  Nursing note and vitals reviewed.    ED Treatments / Results  Labs (all labs ordered are listed, but only abnormal results are displayed) Labs Reviewed  BASIC METABOLIC PANEL - Abnormal; Notable for the following:       Result Value   Sodium 131 (*)    Chloride 99 (*)    Glucose, Bld 176 (*)    Calcium 8.7 (*)    All other components within normal limits  CBC WITH DIFFERENTIAL/PLATELET - Abnormal; Notable for the following:    Hemoglobin 12.3 (*)    HCT 36.8 (*)    All other components within normal limits  D-DIMER, QUANTITATIVE (NOT AT [facility]) - Abnormal; Notable for the following:    D-Dimer, Quant 3.21 (*)    All other components within normal limits  BRAIN NATRIURETIC PEPTIDE  HEMOGLOBIN A1C  URINALYSIS, ROUTINE W REFLEX MICROSCOPIC (NOT AT [facility])  I-STAT  TROPOININ, ED   Hemoglobin  Date Value Ref Range Status  02/12/2016 12.3 (L) 13.0 - 17.0 g/dL Final  01/31/2016 13.2 13.0 - 17.0 g/dL Final  12/14/2015 13.5 13.0 - 17.0 g/dL Final  08/28/2015 13.0 13.0 - 17.0 g/dL Final    EKG  EKG Interpretation  Date/Time:  Saturday February 12 2016 11:49:54 EDT Ventricular Rate:  86 PR Interval:    QRS Duration: 104 QT Interval:  374 QTC Calculation: 448 R Axis:   150 Text Interpretation:  Sinus rhythm Low voltage with right axis deviation Anteroseptal infarct, age indeterminate No significant change since last tracing Confirmed by Winfred Leeds  MD, SAM [id number]) on 02/12/2016 11:59:58 AM       Radiology Dg Chest 2 View  Result Date: 02/12/2016 CLINICAL DATA:  Intermittent cough and shortness of breath EXAM: CHEST  2 VIEW COMPARISON:  January 31, 2016 FINDINGS: The heart size and mediastinal contours are within normal limits. Both lungs are clear. The visualized skeletal structures are unremarkable. IMPRESSION: No active cardiopulmonary disease. Electronically Signed   By: Dorise Bullion III M.D   On: 02/12/2016 11:00  Ct Angio Chest Pe W/cm &/or Wo Cm  Result Date: 02/12/2016 CLINICAL DATA:  Gastric sleeve surgery 2 weeks ago. Productive cough and shortness of breath. EXAM: CT ANGIOGRAPHY CHEST WITH CONTRAST TECHNIQUE: Multidetector CT imaging of the chest was performed using the standard protocol during bolus administration of intravenous contrast. Multiplanar CT image reconstructions and MIPs were obtained to evaluate the vascular anatomy. CONTRAST:  100 mL Isovue 370 COMPARISON:  Two-view chest x-ray from the same day. Two-view chest x-ray 01/31/2016. FINDINGS: Cardiovascular: The heart size is normal. No significant pericardial effusion is present. Calcification is present at the left main coronary artery. Mediastinum/Nodes: Pulmonary arterial opacification is excellent. A filling defect is present in the lateral segmental artery of the right lower lobe.  No other significant filling defects are present. No central emboli or left-sided emboli are present. There is no right heart strain. Lungs/Pleura: A 5 mm nodule is present in the posterior lateral aspect of the right lower lobe. A 4 mm nodule is present along the major fissure on the right. A peripheral 4 mm nodules also present in the superior segment of the right lower lobe.  No significant left-sided nodules are present. A small left pleural effusion and associated airspace disease are present, as previously seen. Upper Abdomen: Gastric bypass surgery is noted. There is a perforation with free air in the lesser sac. This extends along the GE junction on the left. Diffuse inflammatory changes surround the free air. There is no discrete abscess. Inflammatory changes within the body and tail the pancreas are likely secondary. The solid organs are within normal limits. Musculoskeletal: Bone windows demonstrate no focal lytic or blastic lesions. Fusion of anterior osteophytes within the thoracic spine is noted. Review of the MIP images confirms the above findings. IMPRESSION: 1. Free air adjacent to the gastric bypass surgery compatible with perforation of the anastomosis. Free air extends up to the GE junction on the left. 2. Diffuse inflammatory changes about the lesser sac and pancreas. This likely represents secondary inflammation of the pancreas. 3. Segmental pulmonary embolus within the lateral segment of the right lower lobe. 4. Three sub 5 mm nodules in the right lower lobe. These may be inflammatory. No follow-up needed if patient is low-risk (and has no known or suspected primary neoplasm). Non-contrast chest CT can be considered in 12 months if patient is high-risk. This recommendation follows the consensus statement: Guidelines for Management of Incidental Pulmonary Nodules Detected on CT Images:From the Fleischner Society 2017; published online before print (10.1148/radiol.IJ:2314499). 5. Small left  pleural effusion and associated airspace disease. This is likely secondary to subdiaphragmatic inflammation. 6. Coronary artery disease with dense calcification at the left main coronary artery. These results were called by telephone at the time of interpretation on 02/12/2016 at 1:44 pm to Dr. Arlean Hopping , who verbally acknowledged these results. Electronically Signed   By: San Morelle M.D.   On: 02/12/2016 13:45   Procedures Procedures (including critical care time)  Medications Ordered in ED Medications  imipenem-cilastatin (PRIMAXIN) 500 mg in sodium chloride 0.9 % 100 mL IVPB (500 mg Intravenous New Bag/Given 02/12/16 1513)  HYDROmorphone (DILAUDID) injection 1 mg (not administered)  0.9 %  sodium chloride infusion (not administered)  ondansetron (ZOFRAN) injection 4 mg (not administered)  albuterol (PROVENTIL) (2.5 MG/3ML) 0.083% nebulizer solution 2.5 mg (not administered)  insulin aspart (novoLOG) injection 0-15 Units (not administered)  sodium chloride 0.9 % bolus 500 mL (0 mLs Intravenous Stopped 02/12/16 1259)  iopamidol (ISOVUE-370) 76 % injection (100 mLs  Contrast Given 02/12/16 1306)  sodium chloride 0.9 % bolus 1,000 mL (1,000 mLs Intravenous New Bag/Given 02/12/16 1438)     Initial Impression / Assessment and Plan / ED Course  I have reviewed the triage vital signs and the nursing notes.  Pertinent labs & imaging results that were available during my care of the patient were reviewed by me and considered in my medical decision making (see chart for details).  Clinical Course    Michael Martinez presents with cough and some very mild exertional shortness of breath  Findings and plan of care discussed with Orlie Dakin, MD. Dr. Winfred Leeds personally evaluated and examined this patient.   Patient was some exertional dyspnea, surgery 3 weeks ago, and productive cough. No acute abnormalities on chest x-ray. Elevated d-dimer requiring CTA. 1:59 PM Spoke with Ms.  Grandville Silos, RN for [facility] Pickerel, general surgeon, who is currently in the OR performing surgery. The details were communicated with Ms. Grandville Silos and she stated she would pass on the message immediately. Upon call back, Dr. Redmond Pulling would like Zosyn and IV fluids. Pt to be admitted via medicine and  Dr. Redmond Pulling will consult. 2:07 PM Spoke with Tillie Rung, ED pharmacist, regarding antibiotic choice as the surgeon's first choice can not be done due to patient's allergies. Agreed on Primaxin as an acceptable alternative to Zosyn. 2:48 PM Spoke with Dr. Janne Napoleon, hospitalist, who agreed to admit the patient to stepdown. Upon admission, patient continues to be pain-free, vitals are stable, and is in no apparent distress. 3:09 PM Dr. Janne Napoleon called back and stated that after consulting with her team, since this is primarily a surgical issue, general surgery should admit and medicine will consult. Dr. Winfred Leeds also spoke with Dr. Janne Napoleon. Spoke with Dr. Redmond Pulling shortly thereafter, who stated that he would take care of whatever needed to be done from here, including speaking with the hospitalist directly.   Additional details regarding patient's ED course: Patient's daughter and wife are concerned that the patient has not getting adequate nutrition. States he is just drinking water and eating popsicles. They fear that he is depressed. A heartfelt conversation was had with the patient. The patient was encouraged to stay the course. The importance of adequate nutrition was discussed. Patient requested spiritual care services and this order was placed. Patient maintains multiple times that he has no desire to hurt himself or anyone else.      Vitals:   02/12/16 1145 02/12/16 1200 02/12/16 1215 02/12/16 1228  BP: 107/69 118/70 106/79 106/79  Pulse: 85 87 87 95  Resp: 24 13 24 20   Temp:      TempSrc:      SpO2: 95% 97% 97% 100%  Weight:    (!) 163.2 kg  Height:       Vitals:   02/12/16 1228 02/12/16 1230  02/12/16 1245 02/12/16 1415  BP: 106/79 113/77 105/67 110/76  Pulse: 95 88 87 87  Resp: 20 16 23 16   Temp:      TempSrc:      SpO2: 100% 98% 96% 97%  Weight: (!) 163.2 kg     Height:         Final Clinical Impressions(s) / ED Diagnoses   Final diagnoses:  Gastric perforation, acute [facility])    New Prescriptions New Prescriptions   No medications on file     Lorayne Bender, PA-C 02/12/16 [place], PA-C 02/12/16 [place], MD 02/12/16 1650

## 2016-02-12 NOTE — Consult Note (Addendum)
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 He was kept in hospital 1 day postop, had a uncomplicated postsurgical history, came back from [place] on July 15 was doing well until he had low-grade fevers that started on 7/18. He subsequently went to the ER Monday, July 18, sent home after no acute findings, f/u w/ his PCP on July 21.  Sutures were removed without any difficulty. He subsequently went home, and for the past 4 days, he had no fevers but diffuse diaphoresis.  He was unable to sleep well last night, unable to get comfortable in bed. He denies any pain. He had his wife bring him into the hospital today.  He has been on a soup/jello diet since discharge, tol it well, remains pain free throughout his, last BM Thurs.  He cannot recall when he last has flatus.  In ED, vss, ct chest noted free air adjacent to the gastric bypass surgery, compatible with perforation of the anastomosis. Free air extends up to the GE junction on the left.   Pt received 1 dose Primaxin 500mg  iv x 1 at 1513, NS bolus.   Review of Systems:  ROS As per HPI, otherwise all systems reviewed and negative.   Past Medical History: Past Medical History:  Diagnosis Date  . Arthritis    RA  . Bronchitis   . Cellulitis and abscess of lower extremity 03/24/2014   RT LEG  . Cellulitis of right lower extremity    Recurrent  .  Chronic diastolic CHF (congestive heart failure) ([place])    a. 11/2013 Echo: EF 45-50%, mild LVH, gr 1 DD, mildly dil LA.  Marland Kitchen Clotting disorder (HCC)    deep vein thrombosis left leg  . DVT (deep venous thrombosis) ([place]) 2006   "after ankle surgery"  . Essential hypertension   . GERD (gastroesophageal reflux disease)   . Gout   . Hypercholesteremia   . Melanoma ([place])    left ear  . Obesity   . OSA on CPAP   . Peripheral edema    chronic  . Shortness of breath   . Type II diabetes mellitus ([place])     Past Surgical History: Past Surgical History:  Procedure Laterality Date  . gastric sleave    . HERNIA REPAIR    . SKIN CANCER EXCISION    . TONSILLECTOMY       Allergies:   Allergies  Allergen Reactions  . Penicillins     Pt complained of chest tightness, pt very diaphoretic, pt chest, arms red, difficulty breathing.  Marland Kitchen Zosyn [Piperacillin Sod-Tazobactam So]      Social History:  reports that he has never smoked. He has never used smokeless tobacco. He reports that he does not drink alcohol or use drugs.   Family History: Family History  Problem  Relation Age of Onset  . Stroke Mother   . Hypertension Mother   . Asthma Mother   . Tuberculosis Mother   . Hypertension Father   . Asthma Sister   . Asthma Sister   . Asthma Daughter       Physical Exam: Vitals:   02/12/16 1415 02/12/16 1430 02/12/16 1445 02/12/16 1500  BP: 110/76 110/78 118/71 110/73  Pulse: 87 86 93 84  Resp: 16 20 19 21   Temp:      TempSrc:      SpO2: 97% 99% 100% 98%  Weight:      Height:        Constitutional: Alert and awake, oriented x3, not in any acute distress. Morbid obese, pleasant. Eyes: PERLA, EOMI, irises appear normal, anicteric sclera,  ENMT: external ears and nose appear normal, normal hearing             Lips appears normal, oropharynx mucosa moist, tongue, posterior pharynx appear normal  Neck: neck appears normal, no masses, normal ROM, no thyromegaly, no JVD  CVS:  S1-S2 clear, no murmur rubs or gallops, no LE edema, normal pedal pulses  Respiratory:  clear to auscultation bilaterally, no wheezing, rales or rhonchi. Respiratory effort normal. No accessory muscle use.  Abdomen: soft nontender, obese, hypoactive bs,   Musculoskeletal:  no cyanosis, clubbing or edema noted bilaterally Neuro: Cranial nerves grossly II-XII intact, strength intact Psych: judgement and insight appear normal, stable mood and affect, mental status Skin: no rashes or lesions or ulcers, no induration or nodules   Data reviewed:  I have personally reviewed following labs and imaging studies Labs:  CBC:  Recent Labs Lab 02/12/16 1042  WBC 8.3  NEUTROABS 5.8  HGB 12.3*  HCT 36.8*  MCV 84.2  PLT 123456    Basic Metabolic Panel:  Recent Labs Lab 02/12/16 1042  NA 131*  K 4.0  CL 99*  CO2 23  GLUCOSE 176*  BUN 11  CREATININE 1.15  CALCIUM 8.7*   GFR Estimated Creatinine Clearance: 105.3 mL/min (by C-G formula based on SCr of 1.15 mg/dL). Liver Function Tests: No results for input(s): AST, ALT, ALKPHOS, BILITOT, PROT, ALBUMIN in the last 168 hours. No results for input(s): LIPASE, AMYLASE in the last 168 hours. No results for input(s): AMMONIA in the last 168 hours. Coagulation profile No results for input(s): INR, PROTIME in the last 168 hours.  Cardiac Enzymes: No results for input(s): CKTOTAL, CKMB, CKMBINDEX, TROPONINI in the last 168 hours. BNP: Invalid input(s): POCBNP CBG: No results for input(s): GLUCAP in the last 168 hours. D-Dimer  Recent Labs  02/12/16 1042  DDIMER 3.21*   Hgb A1c No results for input(s): HGBA1C in the last 72 hours. Lipid Profile No results for input(s): CHOL, HDL, LDLCALC, TRIG, CHOLHDL, LDLDIRECT in the last 72 hours. Thyroid function studies No results for input(s): TSH, T4TOTAL, T3FREE, THYROIDAB in the last 72 hours.  Invalid input(s): FREET3 Anemia work up No results for input(s): VITAMINB12, FOLATE, FERRITIN,  TIBC, IRON, RETICCTPCT in the last 72 hours. Urinalysis    Component Value Date/Time   COLORURINE YELLOW 01/31/2016 2333   APPEARANCEUR CLEAR 01/31/2016 2333   LABSPEC 1.012 01/31/2016 2333   PHURINE 6.0 01/31/2016 2333   GLUCOSEU NEGATIVE 01/31/2016 2333   HGBUR NEGATIVE 01/31/2016 2333   BILIRUBINUR NEGATIVE 01/31/2016 2333   KETONESUR NEGATIVE 01/31/2016 2333   PROTEINUR NEGATIVE 01/31/2016 2333   UROBILINOGEN 1.0 03/29/2014 1037   NITRITE NEGATIVE 01/31/2016 2333   LEUKOCYTESUR NEGATIVE 01/31/2016  2333     Microbiology No results found for this or any previous visit (from the past 240 hour(s)).     Inpatient Medications:   Scheduled Meds: . insulin aspart  0-15 Units Subcutaneous TID WC   Continuous Infusions: . sodium chloride    . imipenem-cilastatin       Radiological Exams on Admission: Dg Chest 2 View  Result Date: 02/12/2016 CLINICAL DATA:  Intermittent cough and shortness of breath EXAM: CHEST  2 VIEW COMPARISON:  January 31, 2016 FINDINGS: The heart size and mediastinal contours are within normal limits. Both lungs are clear. The visualized skeletal structures are unremarkable. IMPRESSION: No active cardiopulmonary disease. Electronically Signed   By: Dorise Bullion III M.D   On: 02/12/2016 11:00  Ct Angio Chest Pe W/cm &/or Wo Cm  Result Date: 02/12/2016 CLINICAL DATA:  Gastric sleeve surgery 2 weeks ago. Productive cough and shortness of breath. EXAM: CT ANGIOGRAPHY CHEST WITH CONTRAST TECHNIQUE: Multidetector CT imaging of the chest was performed using the standard protocol during bolus administration of intravenous contrast. Multiplanar CT image reconstructions and MIPs were obtained to evaluate the vascular anatomy. CONTRAST:  100 mL Isovue 370 COMPARISON:  Two-view chest x-ray from the same day. Two-view chest x-ray 01/31/2016. FINDINGS: Cardiovascular: The heart size is normal. No significant pericardial effusion is present. Calcification is present at the  left main coronary artery. Mediastinum/Nodes: Pulmonary arterial opacification is excellent. A filling defect is present in the lateral segmental artery of the right lower lobe. No other significant filling defects are present. No central emboli or left-sided emboli are present. There is no right heart strain. Lungs/Pleura: A 5 mm nodule is present in the posterior lateral aspect of the right lower lobe. A 4 mm nodule is present along the major fissure on the right. A peripheral 4 mm nodules also present in the superior segment of the right lower lobe. No significant left-sided nodules are present. A small left pleural effusion and associated airspace disease are present, as previously seen. Upper Abdomen: Gastric bypass surgery is noted. There is a perforation with free air in the lesser sac. This extends along the GE junction on the left. Diffuse inflammatory changes surround the free air. There is no discrete abscess. Inflammatory changes within the body and tail the pancreas are likely secondary. The solid organs are within normal limits. Musculoskeletal: Bone windows demonstrate no focal lytic or blastic lesions. Fusion of anterior osteophytes within the thoracic spine is noted. Review of the MIP images confirms the above findings. IMPRESSION: 1. Free air adjacent to the gastric bypass surgery compatible with perforation of the anastomosis. Free air extends up to the GE junction on the left. 2. Diffuse inflammatory changes about the lesser sac and pancreas. This likely represents secondary inflammation of the pancreas. 3. Segmental pulmonary embolus within the lateral segment of the right lower lobe. 4. Three sub 5 mm nodules in the right lower lobe. These may be inflammatory. No follow-up needed if patient is low-risk (and has no known or suspected primary neoplasm). Non-contrast chest CT can be considered in 12 months if patient is high-risk. This recommendation follows the consensus statement: Guidelines for  Management of Incidental Pulmonary Nodules Detected on CT Images:From the Fleischner Society 2017; published online before print (10.1148/radiol.IJ:2314499). 5. Small left pleural effusion and associated airspace disease. This is likely secondary to subdiaphragmatic inflammation. 6. Coronary artery disease with dense calcification at the left main coronary artery. These results were called by telephone at the time of interpretation  on 02/12/2016 at 1:44 pm to Dr. Arlean Hopping , who verbally acknowledged these results. Electronically Signed   By: San Morelle M.D.   On: 02/12/2016 13:45   Impression/Recommendations Principal Problem:   Intra-abdominal free air of unknown etiology Active Problems:   HTN (hypertension)   Morbid obesity (HCC)   OSA on CPAP   Diabetes type 2, uncontrolled ([place])  1. Perforation of the Anastomosis with free air adjacent to gastric bypass surgical site, with recent gastric sleeve on 01/25/2016 done in [place] - defer to [place], DR Redmond Pulling was called by ER - npo - ivf - emp primaxin 500mg  iv q6hrs, pt has Pcn/zosyn allergy  2. Hypertension, controlled - watch, currently controlled, if need, will add iv metoprolol.  3. dm2 uncontrolled, w/ hyperglycemia - npo, riss for now  4. Sleep apnea on home CPAP - cpap when ok by gs.  5. Morbid obesity, bmi 52 6. Hx of dvt post ankle surgery in 2006 - defer dvt ppx to Surgery.  7. Anemia  - suspect postsurgical related.  Thank you for this consultation.  Our [facility] hospitalist team will follow the patient with you. D/w pt and family.   Time Spent: 72mins  Maren Reamer M.D. [facility] 02/12/2016, 3:12 PM      D/w Dr Redmond Pulling, surger  yesterday 02/12/16 at [place] is to have pt trx to [place], for evaluation with intensive endoscopoist, which [facility] does not have. Pt was accepted to [facility] for trx.

## 2016-02-12 NOTE — ED Notes (Signed)
Patient transported to CT 

## 2016-02-12 NOTE — Progress Notes (Signed)
[place] responded to pager from nurse. Stated that the patient seemed depressed. On my arrival Pt had been taken to CT. Family (Wife/Daughter) were at bedside. I spent some time with Family as we awaited his return. Pt was pleasant but emotionally spent. We explored some areas of his depression. During our conversation, the EDP brought news that the Pt would be admitted and scheduled for surgery. This was unexpected news. I provided the ministry of presence, listening, and prayer. [place] let them know that our services are available 24/7 and I will follow up with this Pt and family during and following surgery.  Darene Lamer Kennon Encinas    02/12/16 1400  Clinical Encounter Type  Visited With Patient;Family;Patient and family together  Visit Type Initial;Spiritual support;Social support;ED  Referral From Nurse  Spiritual Encounters  Spiritual Needs Prayer;Emotional;Grief support  Stress Factors  Patient Stress Factors Exhausted;Health changes;Loss of control;Major life changes  Family Stress Factors Exhausted;Health changes;Major life changes

## 2016-02-12 NOTE — ED Notes (Signed)
Patient transported to X-ray 

## 2016-02-12 NOTE — Progress Notes (Addendum)
ANTICOAGULATION CONSULT NOTE - Initial Consult  Pharmacy Consult for heparin Indication: pulmonary embolus  Allergies  Allergen Reactions  . Penicillins     Pt complained of chest tightness, pt very diaphoretic, pt chest, arms red, difficulty breathing.  Marland Kitchen Zosyn [Piperacillin Sod-Tazobactam So]     Patient Measurements: Height: 6' (182.9 cm) Weight: (!) 359 lb 12.8 oz (163.2 kg) IBW/kg (Calculated) : 77.6 Heparin Dosing Weight: 119  Vital Signs: Temp: 98.6 F (37 C) (07/29 0934) Temp Source: Oral (07/29 0934) BP: 123/77 (07/29 1600) Pulse Rate: 88 (07/29 1600)  Labs:  Recent Labs  02/12/16 1042  HGB 12.3*  HCT 36.8*  PLT 280  CREATININE 1.15    Estimated Creatinine Clearance: 105.3 mL/min (by C-G formula based on SCr of 1.15 mg/dL).   Medical History: Past Medical History:  Diagnosis Date  . Arthritis    RA  . Bronchitis   . Cellulitis and abscess of lower extremity 03/24/2014   RT LEG  . Cellulitis of right lower extremity    Recurrent  . Chronic diastolic CHF (congestive heart failure) ([place])    a. 11/2013 Echo: EF 45-50%, mild LVH, gr 1 DD, mildly dil LA.  Marland Kitchen Clotting disorder (HCC)    deep vein thrombosis left leg  . DVT (deep venous thrombosis) ([place]) 2006   "after ankle surgery"  . Essential hypertension   . GERD (gastroesophageal reflux disease)   . Gout   . Hypercholesteremia   . Melanoma ([place])    left ear  . Obesity   . OSA on CPAP   . Peripheral edema    chronic  . Shortness of breath   . Type II diabetes mellitus (HCC)    Assessment: 15 yoM with recent gastric bypass admitted with acute bowel perforation and new PE.  D/w ED physician and ok with partial bolus with gastric perforation.    Goal of Therapy:  Heparin level 0.3-0.7 units/ml Monitor platelets by anticoagulation protocol: Yes   Plan:  1. Give 3000 units bolus x 1 2. Start heparin infusion at 1800 units/hr 3. Check anti-Xa level in 6 hours and daily while on  heparin 4. Continue to monitor H&H and platelets  Vincenza Hews, PharmD, BCPS 02/12/2016, 4:27 PM Pager: [phone]

## 2016-02-12 NOTE — ED Provider Notes (Signed)
4:12 PM Spoke with Dr. Redmond Pulling, general surgeon, who states that the patient needs a procedure that is not available at this facility. Patient will need to be non-emergently transferred to [facility], or similar teaching facility. States he will handle the set up of the transfer. Requested that I place an order for heparin per pharmacy consult to treat the patient's PE.   Lorayne Bender, PA-C 02/12/16 1615    Orlie Dakin, MD 02/12/16 1650

## 2016-02-12 NOTE — ED Notes (Signed)
MD at bedside. 

## 2016-02-12 NOTE — ED Notes (Signed)
Pt returns from floor.

## 2016-02-21 ENCOUNTER — Ambulatory Visit (INDEPENDENT_AMBULATORY_CARE_PROVIDER_SITE_OTHER): Payer: Self-pay | Admitting: Family Medicine

## 2016-02-21 VITALS — BP 110/80 | HR 80 | Temp 98.5°F | Resp 18 | Ht 72.0 in | Wt 363.0 lb

## 2016-02-21 DIAGNOSIS — I2699 Other pulmonary embolism without acute cor pulmonale: Secondary | ICD-10-CM

## 2016-02-21 NOTE — Progress Notes (Signed)
Subjective:    Patient ID: Michael Martinez, male    DOB: 1956/06/24, 60 y.o.   MRN: [id number]  HPI5/30/17 Patient is a very pleasant 60 year old white male here today to establish care. Past medical history is significant for a DVT after surgery on his ankle in 2006. History of type 2 diabetes mellitus, morbid obesity, obstructive sleep apnea. He has chronic diastolic congestive heart failure with pitting edema in both legs which he treats with torsemide and compression hose. Patient is scheduled to have laparoscopic gastric sleeve surgery performed in [place] in one month. He is doing this because of insurance reasons and caused. I am not an advocate for this but the patient has already made up his mind. I had a long discussion today regarding follow-up and potential complications. He is due for a prostate exam, colonoscopy, Prevnar 13. However he is paying for everything out of pocket and he defers this at the present time as he is saving for his upcoming surgery.  AT that time, my plan was: We spent more than 25 minutes today discussing potential follow-up complications for his upcoming surgery. The first thing I'm concerned about is the necessary vitamins and minerals he will inquire after gastric bypass. Most patients require chronic repletion of vitamin B12, calcium, and iron. I recommended the patient discuss this with his surgeon and his nurses. If they provide him no follow-up, I will start him on replacements of these vitamins and minerals when he returns from his surgery in one month. My second concern is his risk of a DVT. The patient is at higher risk for DVT given his past medical history. Will also be recovering from the surgery was present even high risk for DVT. Furthermore he is flying [place]country to [facility]. All of these are risk for DVT after surgery. I recommended he discuss this with the surgeon. I would at least recommend compression hose on the airplane, a full dose aspirin  although there is no good evidence based research on this, and frequently changing positions and standing and walking about  To improve circulation in his legs. The third concern is hypoglycemia after surgery. I anticipate that his food intake with diminish greatly after his surgery. We will likely need to discontinue Amaryl after surgery. I will obtain baseline lab work to determine how well controlled his blood sugars are so that I can give him an educated answer on what to do with his Amaryl perioperatively. He defers a colonoscopy, PSA, and Prevnar 13 at the present time  02/04/16 Patient had gastric bypass surgery on July 11. He was kept in the hospital for 1 day postoperatively. He returned home shortly thereafter. Surgery was performed in [place]. If complications. Monday he went to emergency room with a low-grade fever. It only occurred that one day it has not recurred since. He denies any symptoms of an infection. Urinalysis in the emergency room is clear. Chest x-ray showed a small left-sided pleural effusion but was otherwise clear. White blood cell count was normal. He did have a mild elevation in his ALT but there was otherwise no significant abnormalities on his lab work. He was diagnosed with a postoperative fever. Today he shows no signs or symptoms of a DVT. He has symmetric swelling in both legs distal to his knees but this is chronic. There is no erythema. There is no warmth. He has a negative Homans sign. He has had no further fevers. He is taking none of his medication. His blood  pressure today is slightly elevated. His blood sugars have been between 101 130 on no medication. He is here today for suture removal. There are 5 sites on his abdomen from his laparoscopic surgery. Each site has 1-3 sutures closing it. All the sutures are removed without difficulty. The wounds were then reinforced with Steri-Strips. There is no evidence of cellulitis.  At t hat time, my plan was: Blood pressure is  elevated. I would like the patient to resume losartan. Also recommended vitamin B12 1000 g daily, iron sulfate 325 mg daily, and I recommended a regular multivitamin to ensure adequate calcium and magnesium absorption. He was given no vitamin recommendations by the doctors who performed the surgery in [place]. At the present time he does not require any of his diabetic medications. He will check his blood sugars frequently. If his fasting blood sugars rise greater than 130 or if his two-hour postprandial sugars rise greater than 160, I would resume metformin. Otherwise I will like to check lab work in 3 months  02/21/16 Patient was subsequently admitted to hospital.  I have copied relevant portions of the DC summary below: Assessment/Plan: S/p Lap sleeve gastrectomy in [place] 7/11 Postoperative leak Acute RLL PE H/o dvt osa on cpap Dm 2  HTN H/o diastolic heart failure Chronic dyspnea  He has a proximal leak from his sleeve gastrectomy. He is nontoxic appearing. He is not tachycardiac. His vitals are stable. His abdominal exam is stable without tenderness, rebound/guarding.   He doesn't have any significant free fluid in lesser sac or fluid collection. He has no gross widespread free air. He doesn't need operative drainage.  His sleeve leak would be best managed by either a long gastric stent or double pigtail catheter (one end intra-luminal in sleeve, other end thru area of perforation). He would be best managed by a intensive endoscopist. Need to r/o acute angulation of distal sleeve to account for leak.  Unfortunately our health system does not have that capability. Did discuss with Dr Oletta Lamas in GI who agrees that no one locally is able to do this.   Explained extensively to pt and family that sleeve leaks are uncommon and can't be fixed quickly due to the nature of the anatomy. Getting a leak to heal generally requires several months, potential numerous procedures/interventions, and/or  hospitalizations.   Spoke with Dr Shawna Orleans at [facility] who has graciously accepted pt in transfer. Family agrees with transfer   Maintain NPO Hep gtt for PE IV abx for gastric leak Will send imaging on disc.  Will need nutrition status addressed at [facility] and vitamin supplementation  Patient was subsequently transferred to [facility] where he was monitored in the hospital for over a week. Surgery was deferred and the leak in his gastric sleave apparently walled itself off and is healing spontaneously on its own. He did not require surgery. He follows up with Dr. Carlena Bjornstad on the 17th. He is currently on Coumadin 5 mg a day to treat his right lower lobe pulmonary embolism. He is also on Lovenox 170 g twice a day for double coverage until his INR is therapeutic. His INR today is 1.9 Past Medical History:  Diagnosis Date  . Arthritis    RA  . Bronchitis   . Cellulitis and abscess of lower extremity 03/24/2014   RT LEG  . Cellulitis of right lower extremity    Recurrent  . Chronic diastolic CHF (congestive heart failure) ([place])    a. 11/2013 Echo: EF 45-50%, mild  LVH, gr 1 DD, mildly dil LA.  Marland Kitchen Clotting disorder (H[facility])    deep vein thrombosis left leg  . DVT (deep venous thrombosis) ([place]) 2006   "after ankle surgery"  . Essential hypertension   . GERD (gastroesophageal reflux disease)   . Gout   . Hypercholesteremia   . Melanoma ([place])    left ear  . Obesity   . OSA on CPAP   . Peripheral edema    chronic  . Shortness of breath   . Type II diabetes mellitus ([place])    Past Surgical History:  Procedure Laterality Date  . gastric sleave    . HERNIA REPAIR    . SKIN CANCER EXCISION    . TONSILLECTOMY     Current Outpatient Prescriptions on File Prior to Visit  Medication Sig Dispense Refill  . albuterol (PROVENTIL) (2.5 MG/3ML) 0.083% nebulizer solution Take 3 mLs (2.5 mg total) by nebulization every 6 (six) hours as needed for wheezing or shortness of breath. 75 mL 12  .  HYDROcodone-acetaminophen (NORCO/VICODIN) 5-325 MG tablet Take 1 tablet by mouth every 8 (eight) hours as needed. 30 tablet 3  . losartan (COZAAR) 100 MG tablet Take 1 tablet (100 mg total) by mouth daily. 90 tablet 1  . lovastatin (MEVACOR) 40 MG tablet Take 1 tablet (40 mg total) by mouth at bedtime. 30 tablet 3  . metFORMIN (GLUCOPHAGE) 1000 MG tablet TAKE 1 TABLET BY MOUTH TWICE DAILY 180 tablet 0  . Multiple Vitamins-Minerals (MULTIVITAMIN WITH MINERALS) tablet Take 1 tablet by mouth daily.    Marland Kitchen OVER THE COUNTER MEDICATION Place 1 patch onto the skin daily. Vitamin B 12 patch    . Spacer/Aero-Holding Chambers (AEROCHAMBER PLUS FLO-VU LARGE) MISC Use spacer when using albuterol. 10 each 0  . torsemide (DEMADEX) 20 MG tablet TAKE ONE TABLET BY MOUTH ONCE TO TWICE DAILY 180 tablet 0  . valACYclovir (VALTREX) 500 MG tablet Take 1 tablet (500 mg total) by mouth 2 (two) times daily as needed (for fever blisters). For 5 days 20 tablet 0  . glimepiride (AMARYL) 4 MG tablet Take 1 tablet (4 mg total) by mouth daily. (Patient not taking: Reported on 01/31/2016) 90 tablet 0   No current facility-administered medications on file prior to visit.    Allergies  Allergen Reactions  . Penicillins     Pt complained of chest tightness, pt very diaphoretic, pt chest, arms red, difficulty breathing.  Marland Kitchen Zosyn [Piperacillin Sod-Tazobactam So]    Social History   Social History  . Marital status: Married    Spouse name: N/A  . Number of children: N/A  . Years of education: N/A   Occupational History  . Not on file.   Social History Main Topics  . Smoking status: Never Smoker  . Smokeless tobacco: Never Used  . Alcohol use No  . Drug use: No  . Sexual activity: Not on file   Other Topics Concern  . Not on file   Social History Narrative  . No narrative on file   Family History  Problem Relation Age of Onset  . Stroke Mother   . Hypertension Mother   . Asthma Mother   . Tuberculosis Mother    . Hypertension Father   . Asthma Sister   . Asthma Sister   . Asthma Daughter       Review of Systems  All other systems reviewed and are negative.      Objective:   Physical Exam  Constitutional: He  is oriented to person, place, and time. He appears well-developed and well-nourished. No distress.  HENT:  Head: Normocephalic and atraumatic.  Eyes: Conjunctivae and EOM are normal. Pupils are equal, round, and reactive to light.  Neck: Neck supple. No JVD present. No thyromegaly present.  Cardiovascular: Normal rate, regular rhythm and normal heart sounds.   No murmur heard. Pulmonary/Chest: Effort normal and breath sounds normal. No respiratory distress. He has no wheezes. He has no rales.  Abdominal: Soft. Bowel sounds are normal. He exhibits no distension. There is no tenderness. There is no rebound and no guarding.  Musculoskeletal: He exhibits edema.  Lymphadenopathy:    He has no cervical adenopathy.  Neurological: He is alert and oriented to person, place, and time. He displays normal reflexes. No cranial nerve deficit. He exhibits normal muscle tone. Coordination normal.  Skin: Skin is warm and dry. No rash noted. He is not diaphoretic. No erythema. No pallor.  Vitals reviewed.         Assessment & Plan:  Other acute pulmonary embolism ([place]) - Plan: PT with INR/Fingerstick  Patient looks remarkable for what he has been through. He denies any abdominal pain nausea vomiting or other signs of peritonitis. He denies any chest pain or shortness of breath. I will increase his Coumadin to 7.5 mg on Mondays and Fridays and 5 mg on all other days. We will recheck an INR on Friday area and hopefully at that time it will be therapeutic and he can discontinue his Lovenox. Moving forward I would recommend eliquis.

## 2016-02-22 LAB — PT WITH INR/FINGERSTICK
INR FINGERSTICK: 1.9 — AB (ref 0.80–1.20)
PT, fingerstick: 22.6 seconds — ABNORMAL HIGH (ref 10.4–12.5)

## 2016-02-24 ENCOUNTER — Telehealth: Payer: Self-pay | Admitting: Family Medicine

## 2016-02-24 NOTE — Telephone Encounter (Signed)
Just had surgery and now having gout flare up.  Can not take current indomethacin.  Appt made to see PCP tomorrow

## 2016-02-25 ENCOUNTER — Ambulatory Visit (INDEPENDENT_AMBULATORY_CARE_PROVIDER_SITE_OTHER): Payer: Self-pay | Admitting: Family Medicine

## 2016-02-25 ENCOUNTER — Other Ambulatory Visit: Payer: Self-pay

## 2016-02-25 ENCOUNTER — Encounter: Payer: Self-pay | Admitting: Family Medicine

## 2016-02-25 VITALS — BP 100/60 | HR 80 | Temp 98.5°F | Resp 22 | Ht 72.0 in | Wt 362.0 lb

## 2016-02-25 DIAGNOSIS — I2699 Other pulmonary embolism without acute cor pulmonale: Secondary | ICD-10-CM

## 2016-02-25 DIAGNOSIS — M1000 Idiopathic gout, unspecified site: Secondary | ICD-10-CM

## 2016-02-25 LAB — PT WITH INR/FINGERSTICK
INR FINGERSTICK: 3.3 — AB (ref 0.80–1.20)
PT, fingerstick: 40 seconds — ABNORMAL HIGH (ref 10.4–12.5)

## 2016-02-25 MED ORDER — OXYCODONE-ACETAMINOPHEN 5-325 MG PO TABS: 1.0000 | ORAL_TABLET | ORAL | 0 refills | Status: DC | PRN

## 2016-02-25 MED ORDER — PREDNISONE 20 MG PO TABS: ORAL_TABLET | ORAL | 0 refills | Status: DC

## 2016-02-25 MED ORDER — METHYLPREDNISOLONE ACETATE 80 MG/ML IJ SUSP
80.0000 mg | Freq: Once | INTRAMUSCULAR | Status: AC
  Administered 2016-02-25: 80 mg via INTRAMUSCULAR

## 2016-02-25 NOTE — Progress Notes (Signed)
Subjective:    Patient ID: Michael Martinez, male    DOB: 01/10/1956, 60 y.o.   MRN: [id number]  HPI5/30/17 Patient is a very pleasant 60 year old white male here today to establish care. Past medical history is significant for a DVT after surgery on his ankle in 2006. History of type 2 diabetes mellitus, morbid obesity, obstructive sleep apnea. He has chronic diastolic congestive heart failure with pitting edema in both legs which he treats with torsemide and compression hose. Patient is scheduled to have laparoscopic gastric sleeve surgery performed in [place] in one month. He is doing this because of insurance reasons and caused. I am not an advocate for this but the patient has already made up his mind. I had a long discussion today regarding follow-up and potential complications. He is due for a prostate exam, colonoscopy, Prevnar 13. However he is paying for everything out of pocket and he defers this at the present time as he is saving for his upcoming surgery.  AT that time, my plan was: We spent more than 62 minutes today discussing potential follow-up complications for his upcoming surgery. The first thing I'm concerned about is the necessary vitamins and minerals he will inquire after gastric bypass. Most patients require chronic repletion of vitamin B12, calcium, and iron. I recommended the patient discuss this with his surgeon and his nurses. If they provide him no follow-up, I will start him on replacements of these vitamins and minerals when he returns from his surgery in one month. My second concern is his risk of a DVT. The patient is at higher risk for DVT given his past medical history. Will also be recovering from the surgery was present even high risk for DVT. Furthermore he is flying cross[place]country to [facility]. All of these are risk for DVT after surgery. I recommended he discuss this with the surgeon. I would at least recommend compression hose on the airplane, a full dose aspirin  although there is no good evidence based research on this, and frequently changing positions and standing and walking about  To improve circulation in his legs. The third concern is hypoglycemia after surgery. I anticipate that his food intake with diminish greatly after his surgery. We will likely need to discontinue Amaryl after surgery. I will obtain baseline lab work to determine how well controlled his blood sugars are so that I can give him an educated answer on what to do with his Amaryl perioperatively. He defers a colonoscopy, PSA, and Prevnar 13 at the present time  02/04/16 Patient had gastric bypass surgery on July 11. He was kept in the hospital for 1 day postoperatively. He returned home shortly thereafter. Surgery was performed in [place]. If complications. Monday he went to emergency room with a low-grade fever. It only occurred that one day it has not recurred since. He denies any symptoms of an infection. Urinalysis in the emergency room is clear. Chest x-ray showed a small left-sided pleural effusion but was otherwise clear. White blood cell count was normal. He did have a mild elevation in his ALT but there was otherwise no significant abnormalities on his lab work. He was diagnosed with a postoperative fever. Today he shows no signs or symptoms of a DVT. He has symmetric swelling in both legs distal to his knees but this is chronic. There is no erythema. There is no warmth. He has a negative Homans sign. He has had no further fevers. He is taking none of his medication. His blood  pressure today is slightly elevated. His blood sugars have been between 101 130 on no medication. He is here today for suture removal. There are 5 sites on his abdomen from his laparoscopic surgery. Each site has 1-3 sutures closing it. All the sutures are removed without difficulty. The wounds were then reinforced with Steri-Strips. There is no evidence of cellulitis.  At t hat time, my plan was: Blood pressure is  elevated. I would like the patient to resume losartan. Also recommended vitamin B12 1000 g daily, iron sulfate 325 mg daily, and I recommended a regular multivitamin to ensure adequate calcium and magnesium absorption. He was given no vitamin recommendations by the doctors who performed the surgery in [place]. At the present time he does not require any of his diabetic medications. He will check his blood sugars frequently. If his fasting blood sugars rise greater than 130 or if his two-hour postprandial sugars rise greater than 160, I would resume metformin. Otherwise I will like to check lab work in 3 months  02/21/16 Patient was subsequently admitted to hospital.  I have copied relevant portions of the DC summary below: Assessment/Plan: S/p Lap sleeve gastrectomy in [place] 7/11 Postoperative leak Acute RLL PE H/o dvt osa on cpap Dm 2  HTN H/o diastolic heart failure Chronic dyspnea  He has a proximal leak from his sleeve gastrectomy. He is nontoxic appearing. He is not tachycardiac. His vitals are stable. His abdominal exam is stable without tenderness, rebound/guarding.   He doesn't have any significant free fluid in lesser sac or fluid collection. He has no gross widespread free air. He doesn't need operative drainage.  His sleeve leak would be best managed by either a long gastric stent or double pigtail catheter (one end intra-luminal in sleeve, other end thru area of perforation). He would be best managed by a intensive endoscopist. Need to r/o acute angulation of distal sleeve to account for leak.  Unfortunately our health system does not have that capability. Did discuss with Dr Oletta Lamas in GI who agrees that no one locally is able to do this.   Explained extensively to pt and family that sleeve leaks are uncommon and can't be fixed quickly due to the nature of the anatomy. Getting a leak to heal generally requires several months, potential numerous procedures/interventions, and/or  hospitalizations.   Spoke with Dr Shawna Orleans at [facility] who has graciously accepted pt in transfer. Family agrees with transfer   Maintain NPO Hep gtt for PE IV abx for gastric leak Will send imaging on disc.  Will need nutrition status addressed at [facility] and vitamin supplementation  Patient was subsequently transferred to [facility] where he was monitored in the hospital for over a week. Surgery was deferred and the leak in his gastric sleave apparently walled itself off and is healing spontaneously on its own. He did not require surgery. He follows up with Dr. Carlena Bjornstad on the 17th. He is currently on Coumadin 5 mg a day to treat his right lower lobe pulmonary embolism. He is also on Lovenox 170 g twice a day for double coverage until his INR is therapeutic. His INR today is 1.9.  At that time, my plan was: Patient looks remarkable for what he has been through. He denies any abdominal pain nausea vomiting or other signs of peritonitis. He denies any chest pain or shortness of breath. I will increase his Coumadin to 7.5 mg on Mondays and Fridays and 5 mg on all other days. We will recheck  an INR on Friday area and hopefully at that time it will be therapeutic and he can discontinue his Lovenox. Moving forward I would recommend eliquis.    02/25/16 INR today is supratherapeutic at 3.3. However the patient has received funding so that he can take Xarelto and have the medication covered by the company. Therefore the patient will be able to start this and take this long-term. Unfortunately since last time I saw the patient he is having a gout exacerbation in both knees. He is unable to walk. He has a chronic history of gout. He can no longer take his indomethacin due to his blood thinner. He cannot tolerate colchicine due to stomach. He is tried oxycodone with no relief. Both these are swollen extremely tender to moderate touch. He denies any falls or injury Past Medical History:  Diagnosis Date  .  Arthritis    RA  . Bronchitis   . Cellulitis and abscess of lower extremity 03/24/2014   RT LEG  . Cellulitis of right lower extremity    Recurrent  . Chronic diastolic CHF (congestive heart failure) ([place])    a. 11/2013 Echo: EF 45-50%, mild LVH, gr 1 DD, mildly dil LA.  Marland Kitchen Clotting disorder (HCC)    deep vein thrombosis left leg  . DVT (deep venous thrombosis) ([place]) 2006   "after ankle surgery"  . Essential hypertension   . GERD (gastroesophageal reflux disease)   . Gout   . Hypercholesteremia   . Melanoma ([place])    left ear  . Obesity   . OSA on CPAP   . Peripheral edema    chronic  . Shortness of breath   . Type II diabetes mellitus ([place])    Past Surgical History:  Procedure Laterality Date  . gastric sleave    . HERNIA REPAIR    . SKIN CANCER EXCISION    . TONSILLECTOMY     Current Outpatient Prescriptions on File Prior to Visit  Medication Sig Dispense Refill  . albuterol (PROVENTIL) (2.5 MG/3ML) 0.083% nebulizer solution Take 3 mLs (2.5 mg total) by nebulization every 6 (six) hours as needed for wheezing or shortness of breath. 75 mL 12  . ciprofloxacin (CIPRO) 500 MG tablet Take 500 mg by mouth 2 (two) times daily.     Marland Kitchen enoxaparin (LOVENOX) 100 MG/ML injection Inject 100 mg into the skin every 12 (twelve) hours.     . enoxaparin (LOVENOX) 80 MG/0.8ML injection Inject 70 mg into the skin every 12 (twelve) hours.     Marland Kitchen glimepiride (AMARYL) 4 MG tablet Take 1 tablet (4 mg total) by mouth daily. 90 tablet 0  . HYDROcodone-acetaminophen (NORCO/VICODIN) 5-325 MG tablet Take 1 tablet by mouth every 8 (eight) hours as needed. 30 tablet 3  . losartan (COZAAR) 100 MG tablet Take 1 tablet (100 mg total) by mouth daily. 90 tablet 1  . lovastatin (MEVACOR) 40 MG tablet Take 1 tablet (40 mg total) by mouth at bedtime. 30 tablet 3  . metFORMIN (GLUCOPHAGE) 1000 MG tablet TAKE 1 TABLET BY MOUTH TWICE DAILY 180 tablet 0  . metroNIDAZOLE (FLAGYL) 500 MG tablet Take 500 mg by mouth 2  (two) times daily.     . Multiple Vitamins-Minerals (MULTIVITAMIN WITH MINERALS) tablet Take 1 tablet by mouth daily.    . ondansetron (ZOFRAN) 4 MG tablet Take 4 mg by mouth every 8 (eight) hours as needed.     Marland Kitchen OVER THE COUNTER MEDICATION Place 1 patch onto the skin daily. Vitamin B  12 patch    . oxyCODONE (OXY IR/ROXICODONE) 5 MG immediate release tablet Take by mouth.    . pantoprazole (PROTONIX) 40 MG tablet Take 40 mg by mouth daily.     Marland Kitchen Spacer/Aero-Holding Chambers (AEROCHAMBER PLUS FLO-VU LARGE) MISC Use spacer when using albuterol. 10 each 0  . torsemide (DEMADEX) 20 MG tablet TAKE ONE TABLET BY MOUTH ONCE TO TWICE DAILY 180 tablet 0  . UNABLE TO FIND Place onto the skin.    . valACYclovir (VALTREX) 500 MG tablet Take 1 tablet (500 mg total) by mouth 2 (two) times daily as needed (for fever blisters). For 5 days 20 tablet 0  . warfarin (COUMADIN) 5 MG tablet Take 5 mg by mouth at bedtime.      No current facility-administered medications on file prior to visit.    Allergies  Allergen Reactions  . Penicillins     Pt complained of chest tightness, pt very diaphoretic, pt chest, arms red, difficulty breathing.  Marland Kitchen Zosyn [Piperacillin Sod-Tazobactam So]    Social History   Social History  . Marital status: Married    Spouse name: N/A  . Number of children: N/A  . Years of education: N/A   Occupational History  . Not on file.   Social History Main Topics  . Smoking status: Never Smoker  . Smokeless tobacco: Never Used  . Alcohol use No  . Drug use: No  . Sexual activity: Not on file   Other Topics Concern  . Not on file   Social History Narrative  . No narrative on file   Family History  Problem Relation Age of Onset  . Stroke Mother   . Hypertension Mother   . Asthma Mother   . Tuberculosis Mother   . Hypertension Father   . Asthma Sister   . Asthma Sister   . Asthma Daughter       Review of Systems  All other systems reviewed and are negative.       Objective:   Physical Exam  Constitutional: He is oriented to person, place, and time. He appears well-developed and well-nourished. No distress.  HENT:  Head: Normocephalic and atraumatic.  Eyes: Conjunctivae and EOM are normal. Pupils are equal, round, and reactive to light.  Neck: Neck supple. No JVD present. No thyromegaly present.  Cardiovascular: Normal rate, regular rhythm and normal heart sounds.   No murmur heard. Pulmonary/Chest: Effort normal and breath sounds normal. No respiratory distress. He has no wheezes. He has no rales.  Abdominal: Soft. Bowel sounds are normal. He exhibits no distension. There is no tenderness. There is no rebound and no guarding.  Musculoskeletal: He exhibits edema.  Lymphadenopathy:    He has no cervical adenopathy.  Neurological: He is alert and oriented to person, place, and time. He displays normal reflexes. No cranial nerve deficit. He exhibits normal muscle tone. Coordination normal.  Skin: Skin is warm and dry. No rash noted. He is not diaphoretic. No erythema. No pallor.  Vitals reviewed.         Assessment & Plan:  Pulmonary embolism on right [facility]) - Plan: PT with INR/Fingerstick  Acute idiopathic gout, unspecified site - Plan: predniSONE (DELTASONE) 20 MG tablet, oxyCODONE-acetaminophen (ROXICET) 5-325 MG tablet, methylPREDNISolone acetate (DEPO-MEDROL) injection 80 mg Discontinue Coumadin. Discontinue Lovenox. Begin Xarelto 15 mg by mouth twice a day for 3 weeks and then switch to 20 mg a day thereafter for at least 6 months. Regarding his gout, the patient received Depo-Medrol 80 mg  IM 1. He can then begin a prednisone taper pack starting tomorrow for the next 6 days. Use Percocet 5/325 one to 2 tablets every 6 hours as needed for pain

## 2016-03-03 ENCOUNTER — Telehealth: Payer: Self-pay | Admitting: Family Medicine

## 2016-03-03 ENCOUNTER — Other Ambulatory Visit: Payer: Self-pay | Admitting: Family Medicine

## 2016-03-03 DIAGNOSIS — M1000 Idiopathic gout, unspecified site: Secondary | ICD-10-CM

## 2016-03-03 MED ORDER — HYDROMORPHONE HCL 2 MG PO TABS: 2.0000 mg | ORAL_TABLET | ORAL | 0 refills | Status: DC | PRN

## 2016-03-03 MED ORDER — PREDNISONE 20 MG PO TABS: ORAL_TABLET | ORAL | 0 refills | Status: DC

## 2016-03-03 NOTE — Telephone Encounter (Signed)
Patient is still hurting from his gout, would like to know if he can get another rx for his prednisone  [phone]

## 2016-03-03 NOTE — Telephone Encounter (Signed)
[place] with one more taper pack.

## 2016-03-03 NOTE — Telephone Encounter (Signed)
Medication called/sent to requested pharmacy and pt aware 

## 2016-03-03 NOTE — Addendum Note (Signed)
Addended by: Shary Decamp B on: 03/03/2016 02:46 PM   Modules accepted: Orders

## 2016-03-06 ENCOUNTER — Ambulatory Visit (INDEPENDENT_AMBULATORY_CARE_PROVIDER_SITE_OTHER): Payer: Self-pay | Admitting: Family Medicine

## 2016-03-06 DIAGNOSIS — M1000 Idiopathic gout, unspecified site: Secondary | ICD-10-CM

## 2016-03-06 NOTE — Progress Notes (Signed)
Subjective:    Patient ID: Michael Martinez, male    DOB: 02-17-1956, 60 y.o.   MRN: [id number]  HPI5/30/17 Patient is a very pleasant 60 year old white male here today to establish care. Past medical history is significant for a DVT after surgery on his ankle in 2006. History of type 2 diabetes mellitus, morbid obesity, obstructive sleep apnea. He has chronic diastolic congestive heart failure with pitting edema in both legs which he treats with torsemide and compression hose. Patient is scheduled to have laparoscopic gastric sleeve surgery performed in [place] in one month. He is doing this because of insurance reasons and caused. I am not an advocate for this but the patient has already made up his mind. I had a long discussion today regarding follow-up and potential complications. He is due for a prostate exam, colonoscopy, Prevnar 13. However he is paying for everything out of pocket and he defers this at the present time as he is saving for his upcoming surgery.  AT that time, my plan was: We spent more than 25 minutes today discussing potential follow-up complications for his upcoming surgery. The first thing I'm concerned about is the necessary vitamins and minerals he will inquire after gastric bypass. Most patients require chronic repletion of vitamin B12, calcium, and iron. I recommended the patient discuss this with his surgeon and his nurses. If they provide him no follow-up, I will start him on replacements of these vitamins and minerals when he returns from his surgery in one month. My second concern is his risk of a DVT. The patient is at higher risk for DVT given his past medical history. Will also be recovering from the surgery was present even high risk for DVT. Furthermore he is flying cross-country to [facility]. All of these are risk for DVT after surgery. I recommended he discuss this with the surgeon. I would at least recommend compression hose on the airplane, a full dose aspirin  although there is no good evidence based research on this, and frequently changing positions and standing and walking about  To improve circulation in his legs. The third concern is hypoglycemia after surgery. I anticipate that his food intake with diminish greatly after his surgery. We will likely need to discontinue Amaryl after surgery. I will obtain baseline lab work to determine how well controlled his blood sugars are so that I can give him an educated answer on what to do with his Amaryl perioperatively. He defers a colonoscopy, PSA, and Prevnar 13 at the present time  02/04/16 Patient had gastric bypass surgery on July 11. He was kept in the hospital for 1 day postoperatively. He returned home shortly thereafter. Surgery was performed in [place]. If complications. Monday he went to emergency room with a low-grade fever. It only occurred that one day it has not recurred since. He denies any symptoms of an infection. Urinalysis in the emergency room is clear. Chest x-ray showed a small left-sided pleural effusion but was otherwise clear. White blood cell count was normal. He did have a mild elevation in his ALT but there was otherwise no significant abnormalities on his lab work. He was diagnosed with a postoperative fever. Today he shows no signs or symptoms of a DVT. He has symmetric swelling in both legs distal to his knees but this is chronic. There is no erythema. There is no warmth. He has a negative Homans sign. He has had no further fevers. He is taking none of his medication. His blood  pressure today is slightly elevated. His blood sugars have been between 101 130 on no medication. He is here today for suture removal. There are 5 sites on his abdomen from his laparoscopic surgery. Each site has 1-3 sutures closing it. All the sutures are removed without difficulty. The wounds were then reinforced with Steri-Strips. There is no evidence of cellulitis.  At t hat time, my plan was: Blood pressure is  elevated. I would like the patient to resume losartan. Also recommended vitamin B12 1000 g daily, iron sulfate 325 mg daily, and I recommended a regular multivitamin to ensure adequate calcium and magnesium absorption. He was given no vitamin recommendations by the doctors who performed the surgery in [place]. At the present time he does not require any of his diabetic medications. He will check his blood sugars frequently. If his fasting blood sugars rise greater than 130 or if his two-hour postprandial sugars rise greater than 160, I would resume metformin. Otherwise I will like to check lab work in 3 months  02/21/16 Patient was subsequently admitted to hospital.  I have copied relevant portions of the DC summary below: Assessment/Plan: S/p Lap sleeve gastrectomy in [place] 7/11 Postoperative leak Acute RLL PE H/o dvt osa on cpap Dm 2  HTN H/o diastolic heart failure Chronic dyspnea  He has a proximal leak from his sleeve gastrectomy. He is nontoxic appearing. He is not tachycardiac. His vitals are stable. His abdominal exam is stable without tenderness, rebound/guarding.   He doesn't have any significant free fluid in lesser sac or fluid collection. He has no gross widespread free air. He doesn't need operative drainage.  His sleeve leak would be best managed by either a long gastric stent or double pigtail catheter (one end intra-luminal in sleeve, other end thru area of perforation). He would be best managed by a intensive endoscopist. Need to r/o acute angulation of distal sleeve to account for leak.  Unfortunately our health system does not have that capability. Did discuss with Dr Oletta Lamas in GI who agrees that no one locally is able to do this.   Explained extensively to pt and family that sleeve leaks are uncommon and can't be fixed quickly due to the nature of the anatomy. Getting a leak to heal generally requires several months, potential numerous procedures/interventions, and/or  hospitalizations.   Spoke with Dr Shawna Orleans at [facility] who has graciously accepted pt in transfer. Family agrees with transfer   Maintain NPO Hep gtt for PE IV abx for gastric leak Will send imaging on disc.  Will need nutrition status addressed at [facility] and vitamin supplementation  Patient was subsequently transferred to [facility] where he was monitored in the hospital for over a week. Surgery was deferred and the leak in his gastric sleave apparently walled itself off and is healing spontaneously on its own. He did not require surgery. He follows up with Dr. Carlena Bjornstad on the 17th. He is currently on Coumadin 5 mg a day to treat his right lower lobe pulmonary embolism. He is also on Lovenox 170 g twice a day for double coverage until his INR is therapeutic. His INR today is 1.9.  At that time, my plan was: Patient looks remarkable for what he has been through. He denies any abdominal pain nausea vomiting or other signs of peritonitis. He denies any chest pain or shortness of breath. I will increase his Coumadin to 7.5 mg on Mondays and Fridays and 5 mg on all other days. We will recheck  an INR on Friday area and hopefully at that time it will be therapeutic and he can discontinue his Lovenox. Moving forward I would recommend eliquis.    02/25/16 INR today is supratherapeutic at 3.3. However the patient has received funding so that he can take Xarelto and have the medication covered by the company. Therefore the patient will be able to start this and take this long-term. Unfortunately since last time I saw the patient he is having a gout exacerbation in both knees. He is unable to walk. He has a chronic history of gout. He can no longer take his indomethacin due to his blood thinner. He cannot tolerate colchicine due to stomach. He is tried oxycodone with no relief. Both these are swollen extremely tender to moderate touch. He denies any falls or injury.  At that time, my plan was: Discontinue  Coumadin. Discontinue Lovenox. Begin Xarelto 15 mg by mouth twice a day for 3 weeks and then switch to 20 mg a day thereafter for at least 6 months. Regarding his gout, the patient received Depo-Medrol 80 mg IM 1. He can then begin a prednisone taper pack starting tomorrow for the next 6 days. Use Percocet 5/325 one to 2 tablets every 6 hours as needed for pain  03/06/16 The patient's knee pain improved on the prednisone but returned with a vengeance as soon as he stopped it. The patient's knee is edematous with an effusion. There is no erythema. There is no warmth. He is walking with a walker. Past Medical History:  Diagnosis Date  . Arthritis    RA  . Bronchitis   . Cellulitis and abscess of lower extremity 03/24/2014   RT LEG  . Cellulitis of right lower extremity    Recurrent  . Chronic diastolic CHF (congestive heart failure) ([place])    a. 11/2013 Echo: EF 45-50%, mild LVH, gr 1 DD, mildly dil LA.  Marland Kitchen Clotting disorder (HCC)    deep vein thrombosis left leg  . DVT (deep venous thrombosis) ([place]) 2006   "after ankle surgery"  . Essential hypertension   . GERD (gastroesophageal reflux disease)   . Gout   . Hypercholesteremia   . Melanoma ([place])    left ear  . Obesity   . OSA on CPAP   . Peripheral edema    chronic  . Shortness of breath   . Type II diabetes mellitus ([place])    Past Surgical History:  Procedure Laterality Date  . gastric sleave    . HERNIA REPAIR    . SKIN CANCER EXCISION    . TONSILLECTOMY     Current Outpatient Prescriptions on File Prior to Visit  Medication Sig Dispense Refill  . albuterol (PROVENTIL) (2.5 MG/3ML) 0.083% nebulizer solution Take 3 mLs (2.5 mg total) by nebulization every 6 (six) hours as needed for wheezing or shortness of breath. 75 mL 12  . enoxaparin (LOVENOX) 80 MG/0.8ML injection Inject 70 mg into the skin every 12 (twelve) hours.     Marland Kitchen glimepiride (AMARYL) 4 MG tablet Take 1 tablet (4 mg total) by mouth daily. 90 tablet 0  .  HYDROcodone-acetaminophen (NORCO/VICODIN) 5-325 MG tablet Take 1 tablet by mouth every 8 (eight) hours as needed. 30 tablet 3  . HYDROmorphone (DILAUDID) 2 MG tablet Take 1 tablet (2 mg total) by mouth every 4 (four) hours as needed for severe pain. 20 tablet 0  . losartan (COZAAR) 100 MG tablet Take 1 tablet (100 mg total) by mouth daily. 90 tablet 1  .  lovastatin (MEVACOR) 40 MG tablet Take 1 tablet (40 mg total) by mouth at bedtime. 30 tablet 3  . metFORMIN (GLUCOPHAGE) 1000 MG tablet TAKE 1 TABLET BY MOUTH TWICE DAILY 180 tablet 0  . Multiple Vitamins-Minerals (MULTIVITAMIN WITH MINERALS) tablet Take 1 tablet by mouth daily.    Marland Kitchen OVER THE COUNTER MEDICATION Place 1 patch onto the skin daily. Vitamin B 12 patch    . oxyCODONE-acetaminophen (ROXICET) 5-325 MG tablet Take 1-2 tablets by mouth every 4 (four) hours as needed for severe pain. 60 tablet 0  . pantoprazole (PROTONIX) 40 MG tablet Take 40 mg by mouth daily.     . predniSONE (DELTASONE) 20 MG tablet 3 tabs poqday 1-2, 2 tabs poqday 3-4, 1 tab poqday 5-6 12 tablet 0  . Spacer/Aero-Holding Chambers (AEROCHAMBER PLUS FLO-VU LARGE) MISC Use spacer when using albuterol. 10 each 0  . torsemide (DEMADEX) 20 MG tablet TAKE ONE TABLET BY MOUTH ONCE TO TWICE DAILY 180 tablet 0  . UNABLE TO FIND Place onto the skin.    . valACYclovir (VALTREX) 500 MG tablet Take 1 tablet (500 mg total) by mouth 2 (two) times daily as needed (for fever blisters). For 5 days 20 tablet 0  . warfarin (COUMADIN) 5 MG tablet Take 5 mg by mouth at bedtime.      No current facility-administered medications on file prior to visit.    Allergies  Allergen Reactions  . Penicillins     Pt complained of chest tightness, pt very diaphoretic, pt chest, arms red, difficulty breathing.  Marland Kitchen Zosyn [Piperacillin Sod-Tazobactam So]    Social History   Social History  . Marital status: Married    Spouse name: N/A  . Number of children: N/A  . Years of education: N/A    Occupational History  . Not on file.   Social History Main Topics  . Smoking status: Never Smoker  . Smokeless tobacco: Never Used  . Alcohol use No  . Drug use: No  . Sexual activity: Not on file   Other Topics Concern  . Not on file   Social History Narrative  . No narrative on file   Family History  Problem Relation Age of Onset  . Stroke Mother   . Hypertension Mother   . Asthma Mother   . Tuberculosis Mother   . Hypertension Father   . Asthma Sister   . Asthma Sister   . Asthma Daughter       Review of Systems  All other systems reviewed and are negative.      Objective:   Physical Exam  Constitutional: He is oriented to person, place, and time. He appears well-developed and well-nourished. No distress.  HENT:  Head: Normocephalic and atraumatic.  Eyes: Conjunctivae and EOM are normal. Pupils are equal, round, and reactive to light.  Neck: Neck supple. No JVD present. No thyromegaly present.  Cardiovascular: Normal rate, regular rhythm and normal heart sounds.   No murmur heard. Pulmonary/Chest: Effort normal and breath sounds normal. No respiratory distress. He has no wheezes. He has no rales.  Abdominal: Soft. Bowel sounds are normal. He exhibits no distension. There is no tenderness. There is no rebound and no guarding.  Musculoskeletal: He exhibits edema.       Right knee: He exhibits decreased range of motion, swelling and effusion. Tenderness found.  Lymphadenopathy:    He has no cervical adenopathy.  Neurological: He is alert and oriented to person, place, and time. He displays normal reflexes. No  cranial nerve deficit. He exhibits normal muscle tone. Coordination normal.  Skin: Skin is warm and dry. No rash noted. He is not diaphoretic. No erythema. No pallor.  Vitals reviewed.         Assessment & Plan:   Patient has right-sided knee pain secondary to a gout exacerbation. He is failed 2 rounds of prednisone. He cannot afford to  colchicine. Therefore begin an intra-articular cortisone injection. Using sterile technique, I injected the right knee with 2 mL of lidocaine, 2 mL of Marcaine, and 2 mL of 40 mg per mL Kenalog area the patient tolerated the procedure well without complication.

## 2016-03-09 ENCOUNTER — Telehealth: Payer: Self-pay | Admitting: Family Medicine

## 2016-03-09 DIAGNOSIS — G4730 Sleep apnea, unspecified: Secondary | ICD-10-CM

## 2016-03-09 DIAGNOSIS — G4710 Hypersomnia, unspecified: Secondary | ICD-10-CM

## 2016-03-09 DIAGNOSIS — R0683 Snoring: Secondary | ICD-10-CM

## 2016-03-09 NOTE — Telephone Encounter (Signed)
OK to do-

## 2016-03-09 NOTE — Telephone Encounter (Signed)
Michael Martinez called saying he's continuing to have trouble sleeping and he'd like a referral for a sleep study sent on his behalf. Please call him if necessary.  Pt's ph# [phone] Thank you.

## 2016-03-10 ENCOUNTER — Other Ambulatory Visit: Payer: Self-pay | Admitting: Family Medicine

## 2016-03-10 MED ORDER — METFORMIN HCL 1000 MG PO TABS: 1000.0000 mg | ORAL_TABLET | Freq: Two times a day (BID) | ORAL | 1 refills | Status: DC

## 2016-03-10 NOTE — Telephone Encounter (Signed)
Order entered for sleep study

## 2016-03-10 NOTE — Telephone Encounter (Signed)
ok 

## 2016-03-17 ENCOUNTER — Other Ambulatory Visit: Payer: Self-pay | Admitting: Family Medicine

## 2016-03-17 ENCOUNTER — Ambulatory Visit (INDEPENDENT_AMBULATORY_CARE_PROVIDER_SITE_OTHER): Payer: Self-pay | Admitting: Family Medicine

## 2016-03-17 ENCOUNTER — Encounter: Payer: Self-pay | Admitting: Family Medicine

## 2016-03-17 ENCOUNTER — Telehealth: Payer: Self-pay | Admitting: Family Medicine

## 2016-03-17 VITALS — BP 126/74 | HR 70 | Temp 98.4°F | Resp 16 | Ht 72.0 in | Wt 332.0 lb

## 2016-03-17 DIAGNOSIS — Z23 Encounter for immunization: Secondary | ICD-10-CM

## 2016-03-17 DIAGNOSIS — I2699 Other pulmonary embolism without acute cor pulmonale: Secondary | ICD-10-CM

## 2016-03-17 DIAGNOSIS — R5383 Other fatigue: Secondary | ICD-10-CM

## 2016-03-17 LAB — CBC WITH DIFFERENTIAL/PLATELET
BASOS ABS: 0 {cells}/uL (ref 0–200)
Basophils Relative: 0 %
EOS PCT: 2 %
Eosinophils Absolute: 140 cells/uL (ref 15–500)
HCT: 38.8 % (ref 38.5–50.0)
Hemoglobin: 12.8 g/dL — ABNORMAL LOW (ref 13.0–17.0)
Lymphocytes Relative: 27 %
Lymphs Abs: 1890 cells/uL (ref 850–3900)
MCH: 27.7 pg (ref 27.0–33.0)
MCHC: 33 g/dL (ref 32.0–36.0)
MCV: 84 fL (ref 80.0–100.0)
MONOS PCT: 9 %
MPV: 10.6 fL (ref 7.5–12.5)
Monocytes Absolute: 630 cells/uL (ref 200–950)
NEUTROS PCT: 62 %
Neutro Abs: 4340 cells/uL (ref 1500–7800)
PLATELETS: 256 10*3/uL (ref 140–400)
RBC: 4.62 MIL/uL (ref 4.20–5.80)
RDW: 15.8 % — AB (ref 11.0–15.0)
WBC: 7 10*3/uL (ref 3.8–10.8)

## 2016-03-17 MED ORDER — HYDROXYZINE HCL 25 MG PO TABS: 25.0000 mg | ORAL_TABLET | Freq: Every evening | ORAL | 1 refills | Status: DC | PRN

## 2016-03-17 NOTE — Progress Notes (Signed)
Subjective:    Patient ID: Michael Martinez, male    DOB: 07-12-1956, 60 y.o.   MRN: [id number]  HPI5/30/17 Patient is a very pleasant 60 year old white male here today to establish care. Past medical history is significant for a DVT after surgery on his ankle in 2006. History of type 2 diabetes mellitus, morbid obesity, obstructive sleep apnea. He has chronic diastolic congestive heart failure with pitting edema in both legs which he treats with torsemide and compression hose. Patient is scheduled to have laparoscopic gastric sleeve surgery performed in [place] in one month. He is doing this because of insurance reasons and caused. I am not an advocate for this but the patient has already made up his mind. I had a long discussion today regarding follow-up and potential complications. He is due for a prostate exam, colonoscopy, Prevnar 13. However he is paying for everything out of pocket and he defers this at the present time as he is saving for his upcoming surgery.  AT that time, my plan was: We spent more than 25 minutes today discussing potential follow-up complications for his upcoming surgery. The first thing I'm concerned about is the necessary vitamins and minerals he will inquire after gastric bypass. Most patients require chronic repletion of vitamin B12, calcium, and iron. I recommended the patient discuss this with his surgeon and his nurses. If they provide him no follow-up, I will start him on replacements of these vitamins and minerals when he returns from his surgery in one month. My second concern is his risk of a DVT. The patient is at higher risk for DVT given his past medical history. Will also be recovering from the surgery was present even high risk for DVT. Furthermore he is flying cross-country to [facility]. All of these are risk for DVT after surgery. I recommended he discuss this with the surgeon. I would at least recommend compression hose on the airplane, a full dose aspirin  although there is no good evidence based research on this, and frequently changing positions and standing and walking about  To improve circulation in his legs. The third concern is hypoglycemia after surgery. I anticipate that his food intake with diminish greatly after his surgery. We will likely need to discontinue Amaryl after surgery. I will obtain baseline lab work to determine how well controlled his blood sugars are so that I can give him an educated answer on what to do with his Amaryl perioperatively. He defers a colonoscopy, PSA, and Prevnar 13 at the present time  02/04/16 Patient had gastric bypass surgery on July 11. He was kept in the hospital for 1 day postoperatively. He returned home shortly thereafter. Surgery was performed in [place]. If complications. Monday he went to emergency room with a low-grade fever. It only occurred that one day it has not recurred since. He denies any symptoms of an infection. Urinalysis in the emergency room is clear. Chest x-ray showed a small left-sided pleural effusion but was otherwise clear. White blood cell count was normal. He did have a mild elevation in his ALT but there was otherwise no significant abnormalities on his lab work. He was diagnosed with a postoperative fever. Today he shows no signs or symptoms of a DVT. He has symmetric swelling in both legs distal to his knees but this is chronic. There is no erythema. There is no warmth. He has a negative Homans sign. He has had no further fevers. He is taking none of his medication. His blood  pressure today is slightly elevated. His blood sugars have been between 101 130 on no medication. He is here today for suture removal. There are 5 sites on his abdomen from his laparoscopic surgery. Each site has 1-3 sutures closing it. All the sutures are removed without difficulty. The wounds were then reinforced with Steri-Strips. There is no evidence of cellulitis.  At t hat time, my plan was: Blood pressure is  elevated. I would like the patient to resume losartan. Also recommended vitamin B12 1000 g daily, iron sulfate 325 mg daily, and I recommended a regular multivitamin to ensure adequate calcium and magnesium absorption. He was given no vitamin recommendations by the doctors who performed the surgery in [place]. At the present time he does not require any of his diabetic medications. He will check his blood sugars frequently. If his fasting blood sugars rise greater than 130 or if his two-hour postprandial sugars rise greater than 160, I would resume metformin. Otherwise I will like to check lab work in 3 months  02/21/16 Patient was subsequently admitted to hospital.  I have copied relevant portions of the DC summary below: Assessment/Plan: S/p Lap sleeve gastrectomy in [place] 7/11 Postoperative leak Acute RLL PE H/o dvt osa on cpap Dm 2  HTN H/o diastolic heart failure Chronic dyspnea  He has a proximal leak from his sleeve gastrectomy. He is nontoxic appearing. He is not tachycardiac. His vitals are stable. His abdominal exam is stable without tenderness, rebound/guarding.   He doesn't have any significant free fluid in lesser sac or fluid collection. He has no gross widespread free air. He doesn't need operative drainage.  His sleeve leak would be best managed by either a long gastric stent or double pigtail catheter (one end intra-luminal in sleeve, other end thru area of perforation). He would be best managed by a intensive endoscopist. Need to r/o acute angulation of distal sleeve to account for leak.  Unfortunately our health system does not have that capability. Did discuss with Dr Oletta Lamas in GI who agrees that no one locally is able to do this.   Explained extensively to pt and family that sleeve leaks are uncommon and can't be fixed quickly due to the nature of the anatomy. Getting a leak to heal generally requires several months, potential numerous procedures/interventions, and/or  hospitalizations.   Spoke with Dr Shawna Orleans at [facility] who has graciously accepted pt in transfer. Family agrees with transfer   Maintain NPO Hep gtt for PE IV abx for gastric leak Will send imaging on disc.  Will need nutrition status addressed at [facility] and vitamin supplementation  Patient was subsequently transferred to [facility] where he was monitored in the hospital for over a week. Surgery was deferred and the leak in his gastric sleave apparently walled itself off and is healing spontaneously on its own. He did not require surgery. He follows up with Dr. Carlena Bjornstad on the 17th. He is currently on Coumadin 5 mg a day to treat his right lower lobe pulmonary embolism. He is also on Lovenox 170 g twice a day for double coverage until his INR is therapeutic. His INR today is 1.9.  At that time, my plan was: Patient looks remarkable for what he has been through. He denies any abdominal pain nausea vomiting or other signs of peritonitis. He denies any chest pain or shortness of breath. I will increase his Coumadin to 7.5 mg on Mondays and Fridays and 5 mg on all other days. We will recheck  an INR on Friday area and hopefully at that time it will be therapeutic and he can discontinue his Lovenox. Moving forward I would recommend eliquis.    02/25/16 INR today is supratherapeutic at 3.3. However the patient has received funding so that he can take Xarelto and have the medication covered by the company. Therefore the patient will be able to start this and take this long-term. Unfortunately since last time I saw the patient he is having a gout exacerbation in both knees. He is unable to walk. He has a chronic history of gout. He can no longer take his indomethacin due to his blood thinner. He cannot tolerate colchicine due to stomach. He is tried oxycodone with no relief. Both these are swollen extremely tender to moderate touch. He denies any falls or injury.  At that time, my plan was: Discontinue  Coumadin. Discontinue Lovenox. Begin Xarelto 15 mg by mouth twice a day for 3 weeks and then switch to 20 mg a day thereafter for at least 6 months. Regarding his gout, the patient received Depo-Medrol 80 mg IM 1. He can then begin a prednisone taper pack starting tomorrow for the next 6 days. Use Percocet 5/325 one to 2 tablets every 6 hours as needed for pain  03/06/16 The patient's knee pain improved on the prednisone but returned with a vengeance as soon as he stopped it. The patient's knee is edematous with an effusion. There is no erythema. There is no warmth. He is walking with a walker.  At that time, my plan was: Patient has right-sided knee pain secondary to a gout exacerbation. He is failed 2 rounds of prednisone. He cannot afford to colchicine. Therefore begin an intra-articular cortisone injection. Using sterile technique, I injected the right knee with 2 mL of lidocaine, 2 mL of Marcaine, and 2 mL of 40 mg per mL Kenalog area the patient tolerated the procedure well without complication.  03/17/16 Thankfully the patient's knee pain has completely subsided after the cortisone injection. He is doing much better. He is here today to recheck a CBC to monitor his platelet count on xarelto.  We also had an extensive discussion about preventing gout flares in the future. I recommended starting allopurinol 200 mg a day. However he would need a prophylactic medication while he started allopurinol. This would either be colchicine or indomethacin. Given his blood thinner, his recent surgery, his gastric bypass, he isn't able to take indomethacin. I do not believe he can afford the cash price on colchicine. He will check on this. However therefore we both decided to hold off on starting allopurinol at the present time. He does complain of severe fatigue. He states these lost almost 80 pounds since his surgery and just a matter of months. I believe this is the likely cause of his fatigue due to "starvation".  However he is possibly developing B12 deficiency Past Medical History:  Diagnosis Date  . Arthritis    RA  . Bronchitis   . Cellulitis and abscess of lower extremity 03/24/2014   RT LEG  . Cellulitis of right lower extremity    Recurrent  . Chronic diastolic CHF (congestive heart failure) ([place])    a. 11/2013 Echo: EF 45-50%, mild LVH, gr 1 DD, mildly dil LA.  Marland Kitchen Clotting disorder (HCC)    deep vein thrombosis left leg  . DVT (deep venous thrombosis) ([place]) 2006   "after ankle surgery"  . Essential hypertension   . GERD (gastroesophageal reflux disease)   .  Gout   . Hypercholesteremia   . Melanoma ([place])    left ear  . Obesity   . OSA on CPAP   . Peripheral edema    chronic  . Shortness of breath   . Type II diabetes mellitus ([place])    Past Surgical History:  Procedure Laterality Date  . gastric sleave    . HERNIA REPAIR    . SKIN CANCER EXCISION    . TONSILLECTOMY     Current Outpatient Prescriptions on File Prior to Visit  Medication Sig Dispense Refill  . albuterol (PROVENTIL) (2.5 MG/3ML) 0.083% nebulizer solution Take 3 mLs (2.5 mg total) by nebulization every 6 (six) hours as needed for wheezing or shortness of breath. 75 mL 12  . glimepiride (AMARYL) 4 MG tablet Take 1 tablet (4 mg total) by mouth daily. 90 tablet 0  . HYDROcodone-acetaminophen (NORCO/VICODIN) 5-325 MG tablet Take 1 tablet by mouth every 8 (eight) hours as needed. 30 tablet 3  . HYDROmorphone (DILAUDID) 2 MG tablet Take 1 tablet (2 mg total) by mouth every 4 (four) hours as needed for severe pain. 20 tablet 0  . losartan (COZAAR) 100 MG tablet Take 1 tablet (100 mg total) by mouth daily. 90 tablet 1  . lovastatin (MEVACOR) 40 MG tablet Take 1 tablet (40 mg total) by mouth at bedtime. 30 tablet 3  . metFORMIN (GLUCOPHAGE) 1000 MG tablet Take 1 tablet (1,000 mg total) by mouth 2 (two) times daily. 180 tablet 1  . Multiple Vitamins-Minerals (MULTIVITAMIN WITH MINERALS) tablet Take 1 tablet by mouth daily.     Marland Kitchen OVER THE COUNTER MEDICATION Place 1 patch onto the skin daily. Vitamin B 12 patch    . oxyCODONE-acetaminophen (ROXICET) 5-325 MG tablet Take 1-2 tablets by mouth every 4 (four) hours as needed for severe pain. 60 tablet 0  . pantoprazole (PROTONIX) 40 MG tablet Take 40 mg by mouth daily.     Marland Kitchen Spacer/Aero-Holding Chambers (AEROCHAMBER PLUS FLO-VU LARGE) MISC Use spacer when using albuterol. 10 each 0  . torsemide (DEMADEX) 20 MG tablet TAKE ONE TABLET BY MOUTH ONCE TO TWICE DAILY 180 tablet 0  . UNABLE TO FIND Place onto the skin.    . valACYclovir (VALTREX) 500 MG tablet Take 1 tablet (500 mg total) by mouth 2 (two) times daily as needed (for fever blisters). For 5 days 20 tablet 0   No current facility-administered medications on file prior to visit.    Allergies  Allergen Reactions  . Penicillins     Pt complained of chest tightness, pt very diaphoretic, pt chest, arms red, difficulty breathing.  Marland Kitchen Zosyn [Piperacillin Sod-Tazobactam So]    Social History   Social History  . Marital status: Married    Spouse name: N/A  . Number of children: N/A  . Years of education: N/A   Occupational History  . Not on file.   Social History Main Topics  . Smoking status: Never Smoker  . Smokeless tobacco: Never Used  . Alcohol use No  . Drug use: No  . Sexual activity: Not on file   Other Topics Concern  . Not on file   Social History Narrative  . No narrative on file   Family History  Problem Relation Age of Onset  . Stroke Mother   . Hypertension Mother   . Asthma Mother   . Tuberculosis Mother   . Hypertension Father   . Asthma Sister   . Asthma Sister   . Asthma Daughter  Review of Systems  All other systems reviewed and are negative.      Objective:   Physical Exam  Constitutional: He is oriented to person, place, and time. He appears well-developed and well-nourished. No distress.  HENT:  Head: Normocephalic and atraumatic.  Eyes: Conjunctivae and  EOM are normal. Pupils are equal, round, and reactive to light.  Neck: Neck supple. No JVD present. No thyromegaly present.  Cardiovascular: Normal rate, regular rhythm and normal heart sounds.   No murmur heard. Pulmonary/Chest: Effort normal and breath sounds normal. No respiratory distress. He has no wheezes. He has no rales.  Abdominal: Soft. Bowel sounds are normal. He exhibits no distension. There is no tenderness. There is no rebound and no guarding.  Musculoskeletal: He exhibits edema.       Right knee: He exhibits normal range of motion, no swelling and no effusion. No tenderness found.  Lymphadenopathy:    He has no cervical adenopathy.  Neurological: He is alert and oriented to person, place, and time. He displays normal reflexes. No cranial nerve deficit. He exhibits normal muscle tone. Coordination normal.  Skin: Skin is warm and dry. No rash noted. He is not diaphoretic. No erythema. No pallor.  Vitals reviewed.         Assessment & Plan:  Other acute pulmonary embolism ([place]) - Plan: CBC with Differential/Platelet  Other fatigue - Plan: Vitamin B12  Repeat a CBC today. As long as the patient is not having thrombocytopenia, we will continue Xarelto.  Bilaterally we decided not to start allopurinol due to cost of the prophylactic medication. He will check on the cost colchicine. I will check a B12 level and the patient has B12 deficiency, can start him on B12 injections.

## 2016-03-17 NOTE — Telephone Encounter (Signed)
Patient states that [facility] faxed over paperwork on the 16th of August and they haven't heard anything back from our office. He states it is for xarelta you can call them at [phone] or [phone]. I did try calling to request the form again however got a recording they weren't open at this time.

## 2016-03-17 NOTE — Addendum Note (Signed)
Addended by: Sheral Flow on: 03/17/2016 12:33 PM   Modules accepted: Orders

## 2016-03-18 LAB — VITAMIN B12: VITAMIN B 12: 405 pg/mL (ref 200–1100)

## 2016-03-21 ENCOUNTER — Telehealth: Payer: Self-pay | Admitting: Family Medicine

## 2016-03-21 NOTE — Telephone Encounter (Signed)
Patient would like to discuss his last lab results. He did get his message but he wants to discuss further.   CB# [phone]

## 2016-03-22 NOTE — Telephone Encounter (Signed)
LMTRC

## 2016-03-22 NOTE — Telephone Encounter (Signed)
Called 2nd phone number and they are suppose to fax over the forms again.

## 2016-03-24 NOTE — Telephone Encounter (Signed)
Spoke to pt on 03/24/16

## 2016-03-30 NOTE — Telephone Encounter (Signed)
Received fax today and filled out and faxed back to [facility]

## 2016-04-04 ENCOUNTER — Telehealth: Payer: Self-pay | Admitting: Family Medicine

## 2016-04-04 NOTE — Telephone Encounter (Signed)
Patient called requesting a letter be sent to his bariatric surgery stating how long he can be off of his xarelto prior and after surgery. I have made the patient an appt for Monday. However the patient would like to know if this is necessary or if you could just call him and discuss over the phone.   Please advise: [phone]

## 2016-04-05 NOTE — Telephone Encounter (Signed)
Pt not only needs to stop xeralto but does need surgical clearance for procedure to fix his gastric sleeve - does he need the appt for Monday or can you just write letter??

## 2016-04-06 NOTE — Telephone Encounter (Signed)
I would want him to be on xarelto for at least 3 months and ideally 6 months before stopping it unless it is an urgent surgery.  He is being treated for a DVT/PE. Why are they doing surgery?  Tell him to swing by (not ov just to talk to me at lunch).  We need to pin down what's going on.

## 2016-04-07 NOTE — Telephone Encounter (Signed)
Per [facility] wants to see pt in ov to discuss

## 2016-04-10 ENCOUNTER — Ambulatory Visit (INDEPENDENT_AMBULATORY_CARE_PROVIDER_SITE_OTHER): Payer: Self-pay | Admitting: Family Medicine

## 2016-04-10 ENCOUNTER — Encounter: Payer: Self-pay | Admitting: Family Medicine

## 2016-04-10 VITALS — BP 100/60 | HR 68 | Temp 98.3°F | Resp 14 | Ht 72.0 in | Wt 322.0 lb

## 2016-04-10 DIAGNOSIS — Z01818 Encounter for other preprocedural examination: Secondary | ICD-10-CM

## 2016-04-10 NOTE — Progress Notes (Signed)
Subjective:    Patient ID: Michael Martinez, male    DOB: [birth year], 61 y.o.   MRN: [id number]  HPI Very complicated situation. Patient was readmitted to [facility] with abdominal pain, nausea, and and vomiting. Apparently his gastric bypass leak persist and is not healing spontaneously. The bariatric surgery specialist at [facility], Dr. Heinz Knuckles, has recommended surgical correction. Due to the leak, the patient is unable to eat solid food. Whenever he does, he becomes partially obstructed and develops vomiting and abdominal pain. Per the patient's report, his surgery will hopefully rectify that situation. They're requesting surgical clearance from me. The complicating factor is the fact that the patient was diagnosed with a pulmonary embolism July 29. He is been on anticoagulation at the present time for 2 months. He will not have completed a minimum of 3 months as recommended until October 29. Furthermore past medical history is significant for a DVT as well.  This makes the patient a high risk for recurrent thromboembolic event off anticoagulation. He is here today to discuss this further. Past Medical History:  Diagnosis Date  . Arthritis    RA  . Bronchitis   . Cellulitis and abscess of lower extremity 03/24/2014   RT LEG  . Cellulitis of right lower extremity    Recurrent  . Chronic diastolic CHF (congestive heart failure) ([place])    a. 11/2013 Echo: EF 45-50%, mild LVH, gr 1 DD, mildly dil LA.  Marland Kitchen Clotting disorder (HCC)    deep vein thrombosis left leg  . DVT (deep venous thrombosis) ([place]) 2006   "after ankle surgery"  . Essential hypertension   . GERD (gastroesophageal reflux disease)   . Gout   . Hypercholesteremia   . Melanoma ([place])    left ear  . Obesity   . OSA on CPAP   . Peripheral edema    chronic  . Shortness of breath   . Type II diabetes mellitus ([place])     Past Surgical History:  Procedure Laterality Date  . gastric sleave    . HERNIA REPAIR    . SKIN CANCER  EXCISION    . TONSILLECTOMY     Current Outpatient Prescriptions on File Prior to Visit  Medication Sig Dispense Refill  . albuterol (PROVENTIL) (2.5 MG/3ML) 0.083% nebulizer solution Take 3 mLs (2.5 mg total) by nebulization every 6 (six) hours as needed for wheezing or shortness of breath. 75 mL 12  . Cholecalciferol (VITAMIN D) 2000 units CAPS Take by mouth.    Marland Kitchen glimepiride (AMARYL) 4 MG tablet Take 1 tablet (4 mg total) by mouth daily. 90 tablet 0  . HYDROcodone-acetaminophen (NORCO/VICODIN) 5-325 MG tablet Take 1 tablet by mouth every 8 (eight) hours as needed. 30 tablet 3  . HYDROmorphone (DILAUDID) 2 MG tablet Take 1 tablet (2 mg total) by mouth every 4 (four) hours as needed for severe pain. 20 tablet 0  . hydrOXYzine (ATARAX/VISTARIL) 25 MG tablet Take 1 tablet (25 mg total) by mouth at bedtime as needed (sleep). 60 tablet 1  . losartan (COZAAR) 100 MG tablet Take 1 tablet (100 mg total) by mouth daily. 90 tablet 1  . lovastatin (MEVACOR) 40 MG tablet Take 1 tablet (40 mg total) by mouth at bedtime. 30 tablet 3  . metFORMIN (GLUCOPHAGE) 1000 MG tablet Take 1 tablet (1,000 mg total) by mouth 2 (two) times daily. 180 tablet 1  . Multiple Vitamins-Minerals (MULTIVITAMIN WITH MINERALS) tablet Take 1 tablet by mouth daily.    Marland Kitchen  OVER THE COUNTER MEDICATION Place 1 patch onto the skin daily. Vitamin B 12 patch    . oxyCODONE-acetaminophen (ROXICET) 5-325 MG tablet Take 1-2 tablets by mouth every 4 (four) hours as needed for severe pain. 60 tablet 0  . pantoprazole (PROTONIX) 40 MG tablet Take 40 mg by mouth daily.     . Rivaroxaban (XARELTO) 15 MG TABS tablet Take 15 mg by mouth 2 (two) times daily with a meal.    . Spacer/Aero-Holding Chambers (AEROCHAMBER PLUS FLO-VU LARGE) MISC Use spacer when using albuterol. 10 each 0  . torsemide (DEMADEX) 20 MG tablet TAKE ONE TABLET BY MOUTH ONCE TO TWICE DAILY 180 tablet 0  . UNABLE TO FIND Place onto the skin.    . valACYclovir (VALTREX) 500 MG  tablet Take 1 tablet (500 mg total) by mouth 2 (two) times daily as needed (for fever blisters). For 5 days 20 tablet 0   No current facility-administered medications on file prior to visit.    Allergies  Allergen Reactions  . Penicillins     Pt complained of chest tightness, pt very diaphoretic, pt chest, arms red, difficulty breathing.  Marland Kitchen Zosyn [Piperacillin Sod-Tazobactam So]    Social History   Social History  . Marital status: Married    Spouse name: N/A  . Number of children: N/A  . Years of education: N/A   Occupational History  . Not on file.   Social History Main Topics  . Smoking status: Never Smoker  . Smokeless tobacco: Never Used  . Alcohol use No  . Drug use: No  . Sexual activity: Not on file   Other Topics Concern  . Not on file   Social History Narrative  . No narrative on file     Review of Systems  All other systems reviewed and are negative.      Objective:   Physical Exam  Cardiovascular: Normal rate, regular rhythm and normal heart sounds.   Pulmonary/Chest: Effort normal and breath sounds normal.  Vitals reviewed.         Assessment & Plan:  Preoperative clearance Spent 30 minutes discussing the situation with the patient. He realizes he is at risk for recurrent venous thromboembolism off anticoagulation. I have requested that we wait at least until November 1 said that he can complete a full 3 months of treatment for his RLL segmental pulmonary embolism.  He could then discontinue Xarelto 48 hours prior to his surgery. I would recommend resuming the medication immediately after surgery as soon is his surgeon is comfortable with him doing so.  Patient understands these risk and is willing to proceed. I will draft a letter to send to his surgeon with our recommendations

## 2016-04-12 ENCOUNTER — Other Ambulatory Visit: Payer: Self-pay | Admitting: Family Medicine

## 2016-04-12 DIAGNOSIS — M1000 Idiopathic gout, unspecified site: Secondary | ICD-10-CM

## 2016-04-14 ENCOUNTER — Telehealth: Payer: Self-pay | Admitting: Family Medicine

## 2016-04-14 NOTE — Telephone Encounter (Signed)
Patient left vm requesting samples of xarelto. He states he has filled out form for prescription help. However they are in [facility] and haven't processed it completely. He is going out of town tomorrow would like to pick up this afternoon.   CB# [phone]

## 2016-04-17 ENCOUNTER — Encounter: Payer: Self-pay | Admitting: Family Medicine

## 2016-04-17 DIAGNOSIS — I2699 Other pulmonary embolism without acute cor pulmonale: Secondary | ICD-10-CM | POA: Insufficient documentation

## 2016-04-19 ENCOUNTER — Ambulatory Visit (HOSPITAL_BASED_OUTPATIENT_CLINIC_OR_DEPARTMENT_OTHER): Payer: Self-pay | Attending: Family Medicine | Admitting: Internal Medicine

## 2016-04-19 DIAGNOSIS — R0683 Snoring: Secondary | ICD-10-CM | POA: Insufficient documentation

## 2016-04-19 DIAGNOSIS — I10 Essential (primary) hypertension: Secondary | ICD-10-CM | POA: Insufficient documentation

## 2016-04-19 DIAGNOSIS — G4710 Hypersomnia, unspecified: Secondary | ICD-10-CM

## 2016-04-19 DIAGNOSIS — E669 Obesity, unspecified: Secondary | ICD-10-CM | POA: Insufficient documentation

## 2016-04-19 DIAGNOSIS — E119 Type 2 diabetes mellitus without complications: Secondary | ICD-10-CM | POA: Insufficient documentation

## 2016-04-19 NOTE — Telephone Encounter (Signed)
Did not get a chance to call pt back before him leaving out of town.

## 2016-04-23 ENCOUNTER — Emergency Department (HOSPITAL_COMMUNITY)
Admission: EM | Admit: 2016-04-23 | Discharge: 2016-04-24 | Disposition: A | Discharge status: Home / Self Care | Payer: Self-pay | Attending: Emergency Medicine | Admitting: Emergency Medicine

## 2016-04-23 ENCOUNTER — Emergency Department (HOSPITAL_COMMUNITY): Payer: Self-pay

## 2016-04-23 ENCOUNTER — Other Ambulatory Visit: Payer: Self-pay

## 2016-04-23 ENCOUNTER — Encounter (HOSPITAL_COMMUNITY): Payer: Self-pay | Admitting: Emergency Medicine

## 2016-04-23 DIAGNOSIS — R531 Weakness: Secondary | ICD-10-CM

## 2016-04-23 DIAGNOSIS — I110 Hypertensive heart disease with heart failure: Secondary | ICD-10-CM | POA: Insufficient documentation

## 2016-04-23 DIAGNOSIS — E1165 Type 2 diabetes mellitus with hyperglycemia: Secondary | ICD-10-CM | POA: Insufficient documentation

## 2016-04-23 DIAGNOSIS — Z7984 Long term (current) use of oral hypoglycemic drugs: Secondary | ICD-10-CM | POA: Insufficient documentation

## 2016-04-23 DIAGNOSIS — R739 Hyperglycemia, unspecified: Secondary | ICD-10-CM

## 2016-04-23 DIAGNOSIS — Z79899 Other long term (current) drug therapy: Secondary | ICD-10-CM | POA: Insufficient documentation

## 2016-04-23 DIAGNOSIS — Z7901 Long term (current) use of anticoagulants: Secondary | ICD-10-CM | POA: Insufficient documentation

## 2016-04-23 DIAGNOSIS — R4781 Slurred speech: Secondary | ICD-10-CM

## 2016-04-23 DIAGNOSIS — I5032 Chronic diastolic (congestive) heart failure: Secondary | ICD-10-CM | POA: Insufficient documentation

## 2016-04-23 DIAGNOSIS — Z85828 Personal history of other malignant neoplasm of skin: Secondary | ICD-10-CM | POA: Insufficient documentation

## 2016-04-23 LAB — CBC
HEMATOCRIT: 40.1 % (ref 39.0–52.0)
HEMOGLOBIN: 13.2 g/dL (ref 13.0–17.0)
MCH: 28.8 pg (ref 26.0–34.0)
MCHC: 32.9 g/dL (ref 30.0–36.0)
MCV: 87.4 fL (ref 78.0–100.0)
Platelets: 244 10*3/uL (ref 150–400)
RBC: 4.59 MIL/uL (ref 4.22–5.81)
RDW: 16.5 % — ABNORMAL HIGH (ref 11.5–15.5)
WBC: 7.8 10*3/uL (ref 4.0–10.5)

## 2016-04-23 LAB — COMPREHENSIVE METABOLIC PANEL
ALBUMIN: 3.2 g/dL — AB (ref 3.5–5.0)
ALK PHOS: 88 U/L (ref 38–126)
ALT: 21 U/L (ref 17–63)
AST: 16 U/L (ref 15–41)
Anion gap: 10 (ref 5–15)
BILIRUBIN TOTAL: 1 mg/dL (ref 0.3–1.2)
BUN: 9 mg/dL (ref 6–20)
CALCIUM: 9.1 mg/dL (ref 8.9–10.3)
CO2: 27 mmol/L (ref 22–32)
CREATININE: 1.32 mg/dL — AB (ref 0.61–1.24)
Chloride: 102 mmol/L (ref 101–111)
GFR calc Af Amer: >60 mL/min (ref >60)
GFR, EST NON AFRICAN AMERICAN: 56 mL/min — AB (ref >60)
GLUCOSE: 185 mg/dL — AB (ref 65–99)
POTASSIUM: 3.6 mmol/L (ref 3.5–5.1)
Sodium: 139 mmol/L (ref 135–145)
TOTAL PROTEIN: 6.2 g/dL — AB (ref 6.5–8.1)

## 2016-04-23 LAB — DIFFERENTIAL
BASOS ABS: 0 10*3/uL (ref 0.0–0.1)
Basophils Relative: 1 %
Eosinophils Absolute: 0.1 10*3/uL (ref 0.0–0.7)
Eosinophils Relative: 1 %
LYMPHS ABS: 2 10*3/uL (ref 0.7–4.0)
LYMPHS PCT: 25 %
MONOS PCT: 9 %
Monocytes Absolute: 0.7 10*3/uL (ref 0.1–1.0)
NEUTROS ABS: 5 10*3/uL (ref 1.7–7.7)
NEUTROS PCT: 64 %

## 2016-04-23 LAB — I-STAT TROPONIN, ED: TROPONIN I, POC: 0.01 ng/mL (ref 0.00–0.08)

## 2016-04-23 LAB — I-STAT CHEM 8, ED
BUN: 12 mg/dL (ref 6–20)
CREATININE: 1.3 mg/dL — AB (ref 0.61–1.24)
Calcium, Ion: 1.14 mmol/L — ABNORMAL LOW (ref 1.15–1.40)
Chloride: 100 mmol/L — ABNORMAL LOW (ref 101–111)
GLUCOSE: 183 mg/dL — AB (ref 65–99)
HEMATOCRIT: 41 % (ref 39.0–52.0)
HEMOGLOBIN: 13.9 g/dL (ref 13.0–17.0)
Potassium: 3.7 mmol/L (ref 3.5–5.1)
Sodium: 138 mmol/L (ref 135–145)
TCO2: 27 mmol/L (ref 0–100)

## 2016-04-23 LAB — PROTIME-INR
INR: 2.4
Prothrombin Time: 26.5 seconds — ABNORMAL HIGH (ref 11.4–15.2)

## 2016-04-23 LAB — APTT: APTT: 38 s — AB (ref 24–36)

## 2016-04-23 MED ORDER — SODIUM CHLORIDE 0.9 % IV BOLUS (SEPSIS): 1000.0000 mL | Freq: Once | INTRAVENOUS | Status: DC

## 2016-04-23 NOTE — ED Triage Notes (Signed)
No neuro deficits noted, no slurred speech, no weakness, no facial droop. Pt in NAD. Hx of blood clots in lungs and on xarelto

## 2016-04-23 NOTE — ED Notes (Signed)
The pt passed his  Swallow screen

## 2016-04-23 NOTE — ED Notes (Signed)
Pt walked in the hall he reports thtst his heart speeded up otherwise he tolerated well

## 2016-04-23 NOTE — ED Provider Notes (Signed)
[facility] DEPT Provider Note  CSN: [id number] Arrival Date & Time: 04/23/16 @ 85  History    Chief Complaint Chief Complaint  Patient presents with  . Aphasia  . Stroke Symptoms    HPI Michael Martinez is a 60 y.o. male. Presents for symptoms at 1115 where patient had cold sweats and shaking in extremities. No LOC, no CP or SOB. Due to pale appearance patient thought it was a low glucose episodes therefore at juice and food which resulted in resolution of symptoms. BG today has been running high around 200s. Had return of event several hours later. Had slurred speech today that started at unknown time today. Noticed by friend today at [facility] however patient is unaware to the slurred speech and endorses can not tell that his speech was abnormal.  Patient endorses intermittent orthostatic symptoms and decreased PO fluid intake. Also notes decreased urine output.  Friday had similar event when working on boat with son, where he became pale and   Has history of Gastric Sleeve in July, also has history of DM. Recently on metformin and glimepiride however was stopped by PCP.  Patient was diagnosed with PE now on Xarelto along w/ a diagnosis of leak in Gastric Sleeve here at [facility] in July and patient is awaiting Surgical repair at [facility].  Past Medical & Surgical History    Past Medical History:  Diagnosis Date  . Arthritis    RA  . Bronchitis   . Cellulitis and abscess of lower extremity 03/24/2014   RT LEG  . Cellulitis of right lower extremity    Recurrent  . Chronic diastolic CHF (congestive heart failure) ([employer])    a. 11/2013 Echo: EF 45-50%, mild LVH, gr 1 DD, mildly dil LA.  Marland Kitchen Clotting disorder (HCC)    deep vein thrombosis left leg  . DVT (deep venous thrombosis) ([place]) 2006   "after ankle surgery"  . Essential hypertension   . GERD (gastroesophageal reflux disease)   . Gout   . Hypercholesteremia   . Melanoma ([place])    left ear  . Obesity   . OSA on CPAP   . Peripheral  edema    chronic  . Pulmonary embolism ([place])   . Shortness of breath   . Type II diabetes mellitus [facility])    Patient Active Problem List   Diagnosis Date Noted  . Pulmonary embolism ([place])   . Intra-abdominal free air of unknown etiology 02/12/2016  . Essential hypertension   . Hypercholesteremia   . Shortness of breath 08/28/2015  . Influenza 08/28/2015  . Acute bronchitis with bronchospasm 08/26/2015  . Edema 03/01/2015  . Chronic diastolic CHF (congestive heart failure) ([place]) 11/12/2014  . Cellulitis 03/29/2014  . Fever blister 03/25/2014  . Cellulitis of right leg 03/24/2014  . Leg swelling 12/04/2013  . Upper airway cough syndrome 12/03/2013  . Hyperlipemia 11/06/2013  . SOB (shortness of breath) 11/06/2013  . Diabetes type 2, uncontrolled ([place]) 11/06/2013  . HTN (hypertension) 10/12/2012  . Cellulitis of right anterior lower leg 10/12/2012  . Morbid obesity ([place]) 10/12/2012  . OSA on CPAP 10/12/2012   Past Surgical History:  Procedure Laterality Date  . gastric sleave    . HERNIA REPAIR    . SKIN CANCER EXCISION    . TONSILLECTOMY      Family & Social History    Family History  Problem Relation Age of Onset  . Stroke Mother   . Hypertension Mother   . Asthma Mother   .  Tuberculosis Mother   . Hypertension Father   . Asthma Sister   . Asthma Sister   . Asthma Daughter    Social History  Substance Use Topics  . Smoking status: Never Smoker  . Smokeless tobacco: Never Used  . Alcohol use No    Home Medications    Prior to Admission medications   Medication Sig Start Date End Date Taking? Authorizing Provider  albuterol (PROVENTIL) (2.5 MG/3ML) 0.083% nebulizer solution Take 3 mLs (2.5 mg total) by nebulization every 6 (six) hours as needed for wheezing or shortness of breath. 08/31/15   Loleta Chance, MD  Cholecalciferol (VITAMIN D) 2000 units CAPS Take by mouth.    Historical Provider, MD  glimepiride (AMARYL) 4 MG tablet Take 1 tablet (4 mg total) by  mouth daily. 12/16/15   Susy Frizzle, MD  HYDROcodone-acetaminophen (NORCO/VICODIN) 5-325 MG tablet Take 1 tablet by mouth every 8 (eight) hours as needed. 05/04/15   Ashok Norris, MD  HYDROmorphone (DILAUDID) 2 MG tablet Take 1 tablet (2 mg total) by mouth every 4 (four) hours as needed for severe pain. 03/03/16   Susy Frizzle, MD  hydrOXYzine (ATARAX/VISTARIL) 25 MG tablet Take 1 tablet (25 mg total) by mouth at bedtime as needed (sleep). 03/17/16   Susy Frizzle, MD  losartan (COZAAR) 100 MG tablet Take 1 tablet (100 mg total) by mouth daily. 01/06/16   Susy Frizzle, MD  lovastatin (MEVACOR) 40 MG tablet Take 1 tablet (40 mg total) by mouth at bedtime. 05/04/15   Ashok Norris, MD  metFORMIN (GLUCOPHAGE) 1000 MG tablet Take 1 tablet (1,000 mg total) by mouth 2 (two) times daily. 03/10/16   Susy Frizzle, MD  Multiple Vitamins-Minerals (MULTIVITAMIN WITH MINERALS) tablet Take 1 tablet by mouth daily.    Historical Provider, MD  OVER THE COUNTER MEDICATION Place 1 patch onto the skin daily. Vitamin B 12 patch    Historical Provider, MD  oxyCODONE-acetaminophen (ROXICET) 5-325 MG tablet Take 1-2 tablets by mouth every 4 (four) hours as needed for severe pain. 02/25/16   Susy Frizzle, MD  pantoprazole (PROTONIX) 40 MG tablet Take 40 mg by mouth daily.  02/18/16 02/17/17  Historical Provider, MD  predniSONE (DELTASONE) 20 MG tablet TAKE THREE TABLETS BY MOUTH DAILY FOR 2 DAYS, THEN TAKE TWO TABLETS DAILY FOR 2 DAYS, THEN TAKE ONE TABLET DAILY FOR 2 DAYS. 04/12/16   Susy Frizzle, MD  Rivaroxaban (XARELTO) 15 MG TABS tablet Take 15 mg by mouth 2 (two) times daily with a meal.    Historical Provider, MD  Spacer/Aero-Holding Chambers (AEROCHAMBER PLUS FLO-VU LARGE) MISC Use spacer when using albuterol. 08/31/15   Loleta Chance, MD  torsemide (DEMADEX) 20 MG tablet TAKE ONE TABLET BY MOUTH ONCE TO TWICE DAILY 11/03/15   Ashok Norris, MD  UNABLE TO FIND Place onto the skin.    Historical  Provider, MD  valACYclovir (VALTREX) 500 MG tablet Take 1 tablet (500 mg total) by mouth 2 (two) times daily as needed (for fever blisters). For 5 days 11/30/15   Ashok Norris, MD    Allergies    Penicillins; Zosyn [piperacillin sod-tazobactam so]; and Adhesive [tape]  I reviewed & agree with nursing's documentation on the patient's past medical, surgical, social & family histories as well as their allergies.  Review of Systems  Complete ROS obtained, and is negative except as stated in HPI.  Physical Exam  Updated Vital Signs BP 103/72 (BP Location: Right Arm)   Pulse 99  Temp 97.9 F (36.6 C) (Oral)   Resp 16   SpO2 98%  I have reviewed the triage vital signs and the nursing notes. Physical Exam CONST: Patient alert, well appearing, in no apparent distress.  EYES: PERRLA. EOMI. Conjunctiva w/o d/c. Lids AT w/o swelling.  ENMT: External Nares & Ears AT w/o swelling. Oropharynx patent. MM dry.  NECK: ROM full w/o rigidity. Trachea midline. JVD absent.  CVS: +S1/S2 w/o obvious murmur. Lower extremities w/o pitting edema. Orthostatic Maneuver Negative.  RESP: Respiratory effort unlabored w/o retractions & accessory muscle use. BS clear bilaterally.  GI: Soft & ND. +BS x 4. TTP absent. Hernia absent. Guarding & Rebound absent.  BACK: CVA TTP absent bilaterally.  SKIN: Skin warm & dry. Turgor poor. No rash.  PSYCH: Alert. Oriented. Affect and mood appropriate.  NEURO: CN II-XII grossly intact. Motor exam symmetric w/ upper & lower extremities 5/5 bilaterally. Sensation grossly intact. Romberg, Rapid Alternating Movements, Finger to Nose & Heel to Shin tests reveal deficits absent.  MSK: Joints located & stable, w/o obvious dislocation & obvious deformity or crepitus absent w/ Cap refill < 2 sec. Peripheral pulses 2+ & equal in all extremities.   ED Treatments & Results   Labs (only abnormal results are displayed) Labs Reviewed  PROTIME-INR - Abnormal; Notable for the  following:       Result Value   Prothrombin Time 26.5 (*)    All other components within normal limits  APTT - Abnormal; Notable for the following:    aPTT 38 (*)    All other components within normal limits  CBC - Abnormal; Notable for the following:    RDW 16.5 (*)    All other components within normal limits  COMPREHENSIVE METABOLIC PANEL - Abnormal; Notable for the following:    Glucose, Bld 185 (*)    Creatinine, Ser 1.32 (*)    Total Protein 6.2 (*)    Albumin 3.2 (*)    GFR calc non Af Amer 56 (*)    All other components within normal limits  I-STAT CHEM 8, ED - Abnormal; Notable for the following:    Chloride 100 (*)    Creatinine, Ser 1.30 (*)    Glucose, Bld 183 (*)    Calcium, Ion 1.14 (*)    All other components within normal limits  DIFFERENTIAL  I-STAT TROPOININ, ED  CBG MONITORING, ED    EKG    EKG Interpretation  Date/Time:    Ventricular Rate:    PR Interval:    QRS Duration:   QT Interval:    QTC Calculation:   R Axis:     Text Interpretation:         Radiology Ct Head Wo Contrast  Result Date: 04/23/2016 CLINICAL DATA:  Weakness.  Slurred speech. EXAM: CT HEAD WITHOUT CONTRAST TECHNIQUE: Contiguous axial images were obtained from the base of the skull through the vertex without intravenous contrast. COMPARISON:  None. FINDINGS: Brain: No evidence of acute infarction, hemorrhage, hydrocephalus, extra-axial collection or mass lesion/mass effect. Vascular: No hyperdense vessel or unexpected calcification. Skull: Normal. Negative for fracture or focal lesion. Sinuses/Orbits: No acute finding. Other: No other abnormalities. IMPRESSION: No acute intracranial abnormality. Electronically Signed   By: Dorise Bullion III M.D   On: 04/23/2016 17:58    Pertinent labs & imaging results that were available during my care of the patient were independently visualized by me and considered in my medical decision making, please see chart for details.  Procedures  (including  critical care time) Procedures  Medications Ordered in ED Medications - No data to display  Initial Impression & Plan / ED Course & Results / Final Disposition   Initial Impression & Plan Patient presents emergency department for assessment of symptoms of diaphoresis color change and symptoms consistent with near-syncope event. Patient has no chest pain no shortness of breath and is therapeutic on Xarelto at this time for recently diagnosed PE. As he has had no syncopal event do not believe it is consistent with worsening pulmonary embolism or thrombus and patient denies chest pain or shortness of breath during any of these episodes therefore ACS is unlikely. Will obtain screening labs and do not believe Imaging other than CT Head would be helpful at this time.  ED Course & Results Labs remarkable for known anticoagulation, therapeutic. Metabolic and hematologic abnormalities absent. ECG reveals no acute STEMI or advanced heart block, long qt syndrome or other arrhythmia and upon my independent visualization of CT head I appreciate [employer]. Endorsements not consistent w/ Intracranial Neurologic event. I have considered and estimate there is low risk for CVA or TIA and as patient is on [facility], presents outside the safe window and w/ symptoms that have resolved, he is not candidate for TPA.  Final Disposition Reassessment of the patient reveals they are in no acute distress. Resting comfortably.   Prior to discharge, ambulation evaluation completed in the ED w/o assistance. Trial revealed no remarkable discomfort & they were able to bear weight & transfer weight appropriately w/o concerns for instability or near syncope. Also w/o orthostasis.  ED Course in its entirety, care plan & clinical impressions w/ associated risks were reviewed w/ the patient and relative(s). In light of the patient's reassuring evaluation above, I consider discharge disposition reasonable. They are in agreement. I gave  my typical, strict return precautions in simple, non-technical language. We also discussed symptoms that are most concerning & would necessitate emergent return. I explicitly told them to immediately return to the ED if new symptoms develop, if worse, or for ANY concern. Treatments & follow up plan agreed upon & I confirmed all concerns & questions were addressed prior to discharge.  Follow Up Susy Frizzle, MD [address] [facility] [address] [place] [address] [phone]  Schedule an appointment as soon as possible for a visit in 2 days For symptomatic reassessment  [place] [address] [id number] [place] [phone] Go to  [place] [facility] [place]   New Prescriptions Discharge Medication List as of 04/23/2016 10:59 PM       Final Clinical Impression & ED Diagnoses  No diagnosis found. Patient care discussed with the attending physician, Dr. Ashok Cordia, who oversaw their evaluation & treatment & voiced agreement.  Note: This document was prepared using Dragon voice recognition software and may include unintentional dictation errors.  House Officer: Voncille Lo, MD, Emergency Medicine Resident.   Voncille Lo, MD 04/26/16 [place], MD 05/01/16 [phone]

## 2016-04-23 NOTE — ED Triage Notes (Signed)
Pt was a [employer] at church this morning, states he broke out in a sweat and had some slurred speech. Pt thought his sugar was low and he asked someone to get him some fruit cup. Witness stated his face went pale. Pt ate candy and felt better. CBG was 217 at church by daughter. Pt stated he started having cold sweats again and he turned white according to witnesses. Rechecked CBG and it was 202, pulse 107, 120 BP.

## 2016-04-23 NOTE — ED Notes (Signed)
1000cc nss sarted one hour ago  But was not documented  Just now stopped

## 2016-04-26 ENCOUNTER — Telehealth: Payer: Self-pay | Admitting: Family Medicine

## 2016-04-26 NOTE — Telephone Encounter (Signed)
Patient calling to speak to you regarding his bill  [phone]

## 2016-04-27 NOTE — Telephone Encounter (Signed)
Spoke with patient he states he has been accepted by [facility] to have 100% Indigent coverage. I told him I would have to reach out to see if we took that and how to fix his account if we do. He states he has put a letter in the mail with the information. I did advise him that I would call him back once I had the letter and answer.   [phone]

## 2016-04-29 NOTE — Procedures (Signed)
Patient Name: Michael Martinez, Michael Martinez Date: 04/19/2016 Gender: Male D.O.B: May 15, 1956 Age (years): 6 Referring Provider: Cletus Gash T. Pickard Height (inches): 72 Interpreting Physician: Baird Lyons MD, ABSM Weight (lbs): 320 RPSGT: Carolin Coy BMI: 43 MRN: [id number] Neck Size: 19.00 CLINICAL INFORMATION Sleep Study Type: Split Night CPAP Indication for sleep study: Diabetes, Hypertension, Obesity, Snoring Epworth Sleepiness Score: 7  SLEEP STUDY TECHNIQUE As per the AASM Manual for the Scoring of Sleep and Associated Events v2.3 (April 2016) with a hypopnea requiring 4% desaturations. The channels recorded and monitored were frontal, central and occipital EEG, electrooculogram (EOG), submentalis EMG (chin), nasal and oral airflow, thoracic and abdominal wall motion, anterior tibialis EMG, snore microphone, electrocardiogram, and pulse oximetry. Continuous positive airway pressure (CPAP) was initiated when the patient met split night criteria and was titrated according to treat sleep-disordered breathing.  MEDICATIONS Medications self-administered by patient taken the night of the study : VALIUM, METFORMIN  RESPIRATORY PARAMETERS Diagnostic Total AHI (/hr): 38.6 RDI (/hr): 46.6 OA Index (/hr): 1.7 CA Index (/hr): 0.0 REM AHI (/hr): 42.9 NREM AHI (/hr): 38.2 Supine AHI (/hr): 59.6 Non-supine AHI (/hr): 13.76 Min O2 Sat (%): 72.00 Mean O2 (%): 93.12 Time below 88% (min): 6.9   Titration Optimal Pressure (cm): 9 AHI at Optimal Pressure (/hr): 0.0 Min O2 at Optimal Pressure (%): 93.0 Supine % at Optimal (%): 0 Sleep % at Optimal (%): 76    SLEEP ARCHITECTURE The recording time for the entire night was 362.2 minutes. During a baseline period of 198.1 minutes, the patient slept for 142.9 minutes in REM and nonREM, yielding a sleep efficiency of 72.1%. Sleep onset after lights out was 8.7 minutes with a REM latency of 50.5 minutes. The patient spent 20.64% of the night in stage N1  sleep, 69.56% in stage N2 sleep, 0.00% in stage N3 and 9.80% in REM. During the titration period of 162.8 minutes, the patient slept for 57.5 minutes in REM and nonREM, yielding a sleep efficiency of 35.3%. Sleep onset after CPAP initiation was 6.7 minutes with a REM latency of 31.0 minutes. The patient spent 34.78% of the night in stage N1 sleep, 41.74% in stage N2 sleep, 0.00% in stage N3 and 23.48% in REM.  CARDIAC DATA The 2 lead EKG demonstrated sinus rhythm. The mean heart rate was 71.72 beats per minute. Other EKG findings include: PACs, PVCs.  LEG MOVEMENT DATA The total Periodic Limb Movements of Sleep (PLMS) were 103. The PLMS index was 30.82 .  IMPRESSIONS - Severe obstructive sleep apnea occurred during the diagnostic portion of the study (AHI = 38.6/hour). An optimal PAP pressure was selected for this patient ( 9 cm of water) - No significant central sleep apnea occurred during the diagnostic portion of the study (CAI = 0.0/hour). - Moderate oxygen desaturation was noted during the diagnostic portion of the study (Min O2 =72.00%). - The patient snored with Moderate snoring volume during the diagnostic portion of the study. - EKG findings include PVCs. - Mild periodic limb movements of sleep occurred during the study.  DIAGNOSIS - Obstructive Sleep Apnea (327.23 [G47.33 ICD-10])  RECOMMENDATIONS - Trial of CPAP therapy on 9 cm H2O with a Medium size Resmed Nasal Mask Airfit N20 mask and heated humidification. - Avoid alcohol, sedatives and other CNS depressants that may worsen sleep apnea and disrupt normal sleep architecture. - Sleep hygiene should be reviewed to assess factors that may improve sleep quality. - Weight management and regular exercise should be initiated or continued.  [Electronically signed] 04/29/2016  11:27 AM  Baird Lyons MD, [place], American Board of Sleep Medicine   NPI: [id number]  [place], American Board of Sleep  Medicine  ELECTRONICALLY SIGNED ON:  04/29/2016, 11:26 AM [place] PH: [phone]   FX: [phone] [place]

## 2016-05-01 ENCOUNTER — Encounter: Payer: Self-pay | Admitting: Family Medicine

## 2016-05-08 ENCOUNTER — Ambulatory Visit (INDEPENDENT_AMBULATORY_CARE_PROVIDER_SITE_OTHER): Payer: Self-pay | Admitting: Family Medicine

## 2016-05-08 ENCOUNTER — Encounter: Payer: Self-pay | Admitting: Family Medicine

## 2016-05-08 VITALS — BP 100/68 | HR 74 | Temp 98.4°F | Resp 18 | Ht 72.0 in | Wt 306.0 lb

## 2016-05-08 DIAGNOSIS — R55 Syncope and collapse: Secondary | ICD-10-CM

## 2016-05-08 DIAGNOSIS — G4733 Obstructive sleep apnea (adult) (pediatric): Secondary | ICD-10-CM

## 2016-05-08 MED ORDER — RIVAROXABAN 15 MG PO TABS: 15.0000 mg | ORAL_TABLET | Freq: Two times a day (BID) | ORAL | 3 refills | Status: DC

## 2016-05-08 MED ORDER — RIVAROXABAN 15 MG PO TABS
15.0000 mg | ORAL_TABLET | Freq: Two times a day (BID) | ORAL | 3 refills | Status: DC
Start: 2016-05-08 — End: 2016-05-08

## 2016-05-08 NOTE — Progress Notes (Signed)
Subjective:    Patient ID: Michael Martinez, male    DOB: 1956-04-07, 60 y.o.   MRN: [id number]  HPI  [id number] Very complicated situation. Patient was readmitted to [facility] with abdominal pain, nausea, and and vomiting. Apparently his gastric bypass leak persist and is not healing spontaneously. The bariatric surgery specialist at [facility], Dr. Heinz Knuckles, has recommended surgical correction. Due to the leak, the patient is unable to eat solid food. Whenever he does, he becomes partially obstructed and develops vomiting and abdominal pain. Per the patient's report, his surgery will hopefully rectify that situation. They're requesting surgical clearance from me. The complicating factor is the fact that the patient was diagnosed with a pulmonary embolism July 29. He is been on anticoagulation at the present time for 2 months. He will not have completed a minimum of 3 months as recommended until October 29. Furthermore past medical history is significant for a DVT as well.  This makes the patient a high risk for recurrent thromboembolic event off anticoagulation. He is here today to discuss this further.  At that time, my plan was: Spent 30 minutes discussing the situation with the patient. He realizes he is at risk for recurrent venous thromboembolism off anticoagulation. I have requested that we wait at least until November 1 said that he can complete a full 3 months of treatment for his RLL segmental pulmonary embolism.  He could then discontinue Xarelto 48 hours prior to his surgery. I would recommend resuming the medication immediately after surgery as soon is his surgeon is comfortable with him doing so.  Patient understands these risk and is willing to proceed. I will draft a letter to send to his surgeon with our recommendations  05/08/16 Patient has 2 issues today. Recently had a sleep study which showed an apnea hypoxia index of 38.6. Study revealed that he would do well with a CPAP setting of  9 cm of water. He needs referral for CPAP supplies to [place]. He was seen in emergency room on October 8. He had 2 episodes where he became ashen gray, clammy, felt lightheaded, and began slurring his speech. Episodes lasted several minutes. There was no true syncope. Went to the emergency room where head CT revealed no intracranial bleed. Lab work was normal. Symptoms are concerning for cardiac arrhythmia and near syncope possible vasovagal event. Past Medical History:  Diagnosis Date  . Arthritis    RA  . Bronchitis   . Cellulitis and abscess of lower extremity 03/24/2014   RT LEG  . Cellulitis of right lower extremity    Recurrent  . Chronic diastolic CHF (congestive heart failure) ([place])    a. 11/2013 Echo: EF 45-50%, mild LVH, gr 1 DD, mildly dil LA.  Marland Kitchen Clotting disorder (HCC)    deep vein thrombosis left leg  . DVT (deep venous thrombosis) ([place]) 2006   "after ankle surgery"  . Essential hypertension   . GERD (gastroesophageal reflux disease)   . Gout   . Hypercholesteremia   . Melanoma ([place])    left ear  . Obesity   . OSA on CPAP    severe (38 AHI), CWP 9 cm  . Peripheral edema    chronic  . Pulmonary embolism ([place])   . Shortness of breath   . Type II diabetes mellitus ([place])     Past Surgical History:  Procedure Laterality Date  . gastric sleave    . HERNIA REPAIR    . SKIN CANCER EXCISION    .  TONSILLECTOMY     Current Outpatient Prescriptions on File Prior to Visit  Medication Sig Dispense Refill  . albuterol (PROVENTIL) (2.5 MG/3ML) 0.083% nebulizer solution Take 3 mLs (2.5 mg total) by nebulization every 6 (six) hours as needed for wheezing or shortness of breath. 75 mL 12  . Cholecalciferol (VITAMIN D) 2000 units CAPS Take by mouth.    Marland Kitchen glimepiride (AMARYL) 4 MG tablet Take 1 tablet (4 mg total) by mouth daily. 90 tablet 0  . HYDROcodone-acetaminophen (NORCO/VICODIN) 5-325 MG tablet Take 1 tablet by mouth every 8 (eight) hours as needed. 30 tablet 3  .  HYDROmorphone (DILAUDID) 2 MG tablet Take 1 tablet (2 mg total) by mouth every 4 (four) hours as needed for severe pain. 20 tablet 0  . hydrOXYzine (ATARAX/VISTARIL) 25 MG tablet Take 1 tablet (25 mg total) by mouth at bedtime as needed (sleep). 60 tablet 1  . losartan (COZAAR) 100 MG tablet Take 1 tablet (100 mg total) by mouth daily. 90 tablet 1  . lovastatin (MEVACOR) 40 MG tablet Take 1 tablet (40 mg total) by mouth at bedtime. 30 tablet 3  . metFORMIN (GLUCOPHAGE) 1000 MG tablet Take 1 tablet (1,000 mg total) by mouth 2 (two) times daily. 180 tablet 1  . Multiple Vitamins-Minerals (MULTIVITAMIN WITH MINERALS) tablet Take 1 tablet by mouth daily.    Marland Kitchen OVER THE COUNTER MEDICATION Place 1 patch onto the skin daily. Vitamin B 12 patch    . oxyCODONE-acetaminophen (ROXICET) 5-325 MG tablet Take 1-2 tablets by mouth every 4 (four) hours as needed for severe pain. 60 tablet 0  . pantoprazole (PROTONIX) 40 MG tablet Take 40 mg by mouth daily.     . Rivaroxaban (XARELTO) 15 MG TABS tablet Take 15 mg by mouth 2 (two) times daily with a meal.    . Spacer/Aero-Holding Chambers (AEROCHAMBER PLUS FLO-VU LARGE) MISC Use spacer when using albuterol. 10 each 0  . torsemide (DEMADEX) 20 MG tablet TAKE ONE TABLET BY MOUTH ONCE TO TWICE DAILY 180 tablet 0  . UNABLE TO FIND Place onto the skin.    . valACYclovir (VALTREX) 500 MG tablet Take 1 tablet (500 mg total) by mouth 2 (two) times daily as needed (for fever blisters). For 5 days 20 tablet 0   No current facility-administered medications on file prior to visit.    Allergies  Allergen Reactions  . Penicillins Anaphylaxis, Hives, Shortness Of Breath and Swelling    Pt complained of chest tightness, pt very diaphoretic, pt chest, arms red, difficulty breathing, ALL CILLINS, Has patient had a PCN reaction causing immediate rash, facial/tongue/throat swelling, SOB or lightheadedness with hypotension: Yes Has patient had a PCN reaction causing severe rash  involving mucus membranes or skin necrosis: No Has patient had a PCN reaction that required hospitalization No Has patient had a PCN reaction occurring within the last 10 years: No If all of the above answers are  . Zosyn [Piperacillin Sod-Tazobactam So] Anaphylaxis, Hives, Shortness Of Breath and Swelling  . Adhesive [Tape] Other (See Comments)    Blood thinners, Please use "paper" tape   Social History   Social History  . Marital status: Married    Spouse name: N/A  . Number of children: N/A  . Years of education: N/A   Occupational History  . Not on file.   Social History Main Topics  . Smoking status: Never Smoker  . Smokeless tobacco: Never Used  . Alcohol use No  . Drug use: No  . Sexual  activity: Not on file   Other Topics Concern  . Not on file   Social History Narrative  . No narrative on file     Review of Systems  All other systems reviewed and are negative.      Objective:   Physical Exam  Constitutional: He is oriented to person, place, and time.  Cardiovascular: Normal rate, regular rhythm and normal heart sounds.   No murmur heard. Pulmonary/Chest: Effort normal and breath sounds normal. No respiratory distress. He has no wheezes. He has no rales. He exhibits no tenderness.  Neurological: He is alert and oriented to person, place, and time. He has normal reflexes. He displays normal reflexes. No cranial nerve deficit. He exhibits normal muscle tone. Coordination normal.  Vitals reviewed.         Assessment & Plan:  Near syncope  I do not believe this was a TIA. Rather I believe the patient may have had a vasovagal event. My concern is for cardiac arrhythmia. I will consult cardiology for an echocardiogram given his history of depressed ejection fraction in the past to evaluate for worsening of his cardiomyopathy and also for an event monitor. I will also refer the patient for CPAP supplies

## 2016-05-09 ENCOUNTER — Other Ambulatory Visit: Payer: Self-pay | Admitting: Family Medicine

## 2016-05-09 ENCOUNTER — Telehealth: Payer: Self-pay | Admitting: Family Medicine

## 2016-05-09 DIAGNOSIS — G4733 Obstructive sleep apnea (adult) (pediatric): Secondary | ICD-10-CM

## 2016-05-09 MED ORDER — RIVAROXABAN 15 MG PO TABS: 15.0000 mg | ORAL_TABLET | Freq: Every day | ORAL | 1 refills | Status: DC

## 2016-05-09 NOTE — Telephone Encounter (Signed)
-----   Message from Susy Frizzle, MD sent at 05/08/2016  1:03 PM EDT ----- Patient needs a referral to [place] [facility] for CPAP supplies. His recent sleep study states that he needs 9 cm of water and he has no equipment at home and needs to have this arranged to [place].

## 2016-05-10 ENCOUNTER — Encounter: Payer: Self-pay | Admitting: Cardiovascular Disease

## 2016-05-10 ENCOUNTER — Ambulatory Visit (INDEPENDENT_AMBULATORY_CARE_PROVIDER_SITE_OTHER): Payer: Self-pay | Admitting: Cardiovascular Disease

## 2016-05-10 ENCOUNTER — Telehealth: Payer: Self-pay | Admitting: Family Medicine

## 2016-05-10 VITALS — BP 128/86 | HR 91 | Ht 72.0 in | Wt 309.2 lb

## 2016-05-10 DIAGNOSIS — E1165 Type 2 diabetes mellitus with hyperglycemia: Secondary | ICD-10-CM

## 2016-05-10 DIAGNOSIS — IMO0001 Reserved for inherently not codable concepts without codable children: Secondary | ICD-10-CM

## 2016-05-10 DIAGNOSIS — Z9889 Other specified postprocedural states: Secondary | ICD-10-CM

## 2016-05-10 DIAGNOSIS — I2782 Chronic pulmonary embolism: Secondary | ICD-10-CM

## 2016-05-10 DIAGNOSIS — I5032 Chronic diastolic (congestive) heart failure: Secondary | ICD-10-CM

## 2016-05-10 DIAGNOSIS — I10 Essential (primary) hypertension: Secondary | ICD-10-CM

## 2016-05-10 DIAGNOSIS — Z9884 Bariatric surgery status: Secondary | ICD-10-CM

## 2016-05-10 DIAGNOSIS — E7800 Pure hypercholesterolemia, unspecified: Secondary | ICD-10-CM

## 2016-05-10 NOTE — Patient Instructions (Signed)

## 2016-05-10 NOTE — Progress Notes (Signed)
Cardiology Office Note  Date:  05/10/2016   ID:  Michael Martinez, DOB 20-Dec-1955, MRN [id number]  PCP:  Odette Fraction, MD   Chief Complaint  Patient presents with  . OTHER    Syncope. Meds reviewed verbally with pt.    HPI:  Michael Martinez is a 60 year old gentleman with morbid obesity, obstructive sleep apnea who uses a CPAP, diabetes type 2 for the past 20 years, history of DVT after broken foot, who presents for routine follow-up of his shortness of breath.  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 Started on anticoagulation at that time  Reports he is scheduled for surgery Nov 16th at [facility]  for repair of perforation , Dr. Vicenta Aly  With his weight loss he reports improved blood pressure, better also arthritis, stopped his cholesterol medication   Hospital records reviewed from 04/23/2016:  was having symptoms of Sweating,  looked pale, creatinine  mildly elevated 1.3, potassium 3.6 CT head Performed  He is having difficulty keeping up with his fluids  Orthostatic in the office today, playing blood pressure 133/87, heart rate 90,  With standing heart rate up to [id number], systolic pressure 88 with recovery after 3 minutes (though maintaining sinus tachycardia)  CT chest abdomen reviewed with him showing no significant aortic atherosclerosis  EKG on today's visit shows normal sinus rhythm with rate 91 bpm, no significant ST or T-wave changes, rare PVCs, poor R-wave progression through the precordial leads  Other past medical history reviewed  He reports that in 2015, he had significant shortness of breath, wheezing, bronchospasm following upper respiratory infection. On his  visit with [place], he had a bronchospastic cough. He was seen by pulmonary 12/03/2013, started on prednisone, medication changes made, cough syrup with improvement of his symptoms.  He reports having cellulitis September 2015  Prior CT scan in 2015 showing no PE, minimal atherosclerosis in the aortic arch  echocardiogram 2015 showing mildly depressed ejection fraction, 45-50%, otherwise no significant valvular disease, normal right ventricular systolic pressures.  PMH:   has a past medical history of Arthritis; Bronchitis; Cellulitis and abscess of lower extremity (03/24/2014); Cellulitis of right lower extremity; Chronic diastolic CHF (congestive heart failure) ([place]); Clotting disorder ([place]); DVT (deep venous thrombosis) ([place]) (2006); Essential hypertension; GERD (gastroesophageal reflux disease); Gout; Hypercholesteremia; Melanoma ([place]); Obesity; OSA on CPAP; Peripheral edema; Pulmonary embolism ([place]); Shortness of breath; and Type II diabetes mellitus ([place]).  PSH:    Past Surgical History:  Procedure Laterality Date  . gastric sleave    . HERNIA REPAIR    . SKIN CANCER EXCISION    . TONSILLECTOMY      Current Outpatient Prescriptions  Medication Sig Dispense Refill  . Cholecalciferol (VITAMIN D) 2000 units CAPS Take by mouth.    Marland Kitchen HYDROcodone-acetaminophen (NORCO/VICODIN) 5-325 MG tablet Take 1 tablet by mouth every 8 (eight) hours as needed. 30 tablet 3  . HYDROmorphone (DILAUDID) 2 MG tablet Take 1 tablet (2 mg total) by mouth every 4 (four) hours as needed for severe pain. 20 tablet 0  . hydrOXYzine (ATARAX/VISTARIL) 25 MG tablet Take 1 tablet (25 mg total) by mouth at bedtime as needed (sleep). 60 tablet 1  . Multiple Vitamins-Minerals (  MULTIVITAMIN WITH MINERALS) tablet Take 1 tablet by mouth daily.    Marland Kitchen OVER THE COUNTER MEDICATION Place 1 patch onto the skin daily. Vitamin B 12 patch    . oxyCODONE-acetaminophen (ROXICET) 5-325 MG tablet Take 1-2 tablets by mouth every 4 (four)  hours as needed for severe pain. 60 tablet 0  . pantoprazole (PROTONIX) 40 MG tablet Take 40 mg by mouth daily.     . rivaroxaban (XARELTO) 20 MG TABS tablet Take 20 mg by mouth daily with supper.    Marland Kitchen Spacer/Aero-Holding Chambers (AEROCHAMBER PLUS FLO-VU LARGE) MISC Use spacer when using albuterol. 10 each 0  . valACYclovir (VALTREX) 500 MG tablet Take 1 tablet (500 mg total) by mouth 2 (two) times daily as needed (for fever blisters). For 5 days 20 tablet 0   No current facility-administered medications for this visit.      Allergies:   Penicillins; Zosyn [piperacillin sod-tazobactam so]; and Adhesive [tape]   Social History:  The patient  reports that he has never smoked. He has never used smokeless tobacco. He reports that he does not drink alcohol or use drugs.   Family History:   family history includes Asthma in his daughter, mother, sister, and sister; Hypertension in his father and mother; Stroke in his mother; Tuberculosis in his mother.    Review of Systems: Review of Systems  Constitutional: Negative.   Respiratory: Negative.   Cardiovascular: Negative.   Gastrointestinal: Negative.   Musculoskeletal: Negative.   Neurological: Negative.   Psychiatric/Behavioral: Negative.   All other systems reviewed and are negative.    PHYSICAL EXAM: VS:  BP 128/86 (BP Location: Right Arm, Patient Position: Sitting, Cuff Size: Large)   Pulse 91   Ht 6' (1.829 m)   Wt (!) 309 lb 4 oz (140.3 kg)   BMI 41.94 kg/m  , BMI Body mass index is 41.94 kg/m. GEN: Well nourished, well developed, in no acute distress, obese  HEENT: normal  Neck: no JVD, carotid bruits, or masses Cardiac: RRR; no murmurs, rubs, or gallops,no edema  Respiratory:  clear to auscultation bilaterally, normal work of breathing GI: soft, nontender, nondistended, + BS MS: no deformity or atrophy  Skin: warm and dry, no rash Neuro:  Strength and sensation are intact Psych: euthymic mood, full  affect    Recent Labs: 02/12/2016: B Natriuretic Peptide 99.7 04/23/2016: ALT 21; BUN 12; Creatinine, Ser 1.30; Hemoglobin 13.9; Platelets 244; Potassium 3.7; Sodium 138    Lipid Panel Lab Results  Component Value Date   CHOL 184 12/14/2015   HDL 54 12/14/2015   LDLCALC 115 12/14/2015   TRIG 73 12/14/2015      Wt Readings from Last 3 Encounters:  05/10/16 (!) 309 lb 4 oz (140.3 kg)  05/08/16 (!) 306 lb (138.8 kg)  04/19/16 (!) 320 lb (145.2 kg)       ASSESSMENT AND PLAN:  Essential hypertension - Plan: EKG 12-Lead Blood pressure is well controlled on today's visit. No changes made to the medications.  Chronic diastolic CHF (congestive heart failure) (HCC) - Plan: EKG [id number] Diastolic CHF not an issue at this point, even his recent gastric sleave is had difficulty maintaining adequate hydration. Recommended that he not take any diuretics that he pushes fluids as much as possible. He is orthostatic on today's visit  Hypercholesteremia Reports that he stopped his cholesterol medication He plans on losing additional weight Once weight stabilizes, could repeat lab work  Other chronic pulmonary embolism without acute cor pulmonale ([place]) History of  PE once after broken foot/immobilized state years ago, and again recently after gastric sleeve surgery in [place], long plane ride home, immobilized state in recovery. Both episodes with contributing factors to his DVT PE. Likely does not need lifelong anticoagulation only compression hose and precautions for periods of immobility. He would not need heparin infusion prior to upcoming surgery in November. Potentially could stop anticoagulation following the surgery depending on his activity level  Morbid obesity ([place]) Rapid 100 pound weight loss in the past 3 months after gastric sleeve surgery  Uncontrolled type 2 diabetes mellitus without complication, without long-term current use of insulin ([place])  H/O gastric bypass Complication  following surgery in [place], perforation Scheduled for corrective surgery in November at [facility] Acceptable risk, no further testing needed Would stop xarelto 3 days prior to the procedure  Disposition:   F/U  6 months   Orders Placed This Encounter  Procedures  . EKG 12-Lead     Signed, Esmond Plants, M.D., Ph.D. 05/10/2016  [place], [place]

## 2016-05-10 NOTE — Telephone Encounter (Signed)
Pt called Michael Martinez stating that he is not better and has started coughing up green and yellow and you told him that if he got worse or not better to call back for antibx however it is not stated in lov, please advise.

## 2016-05-11 ENCOUNTER — Ambulatory Visit (INDEPENDENT_AMBULATORY_CARE_PROVIDER_SITE_OTHER): Payer: Self-pay | Admitting: *Deleted

## 2016-05-11 DIAGNOSIS — Z23 Encounter for immunization: Secondary | ICD-10-CM

## 2016-05-11 DIAGNOSIS — S99922A Unspecified injury of left foot, initial encounter: Secondary | ICD-10-CM

## 2016-05-11 MED ORDER — AZITHROMYCIN 250 MG PO TABS: ORAL_TABLET | ORAL | 0 refills | Status: DC

## 2016-05-11 NOTE — Telephone Encounter (Signed)
Patient aware of providers recommendations and med sent to pharmacy

## 2016-05-11 NOTE — Telephone Encounter (Signed)
Call out zpack

## 2016-05-11 NOTE — Progress Notes (Signed)
Patient ID: Michael Martinez, male   DOB: December 19, 1955, 60 y.o.   MRN: [id number]  Patient seen in office for TdaP injection.   Reports that he [place] last [place] and stepped on screw. States that screw punctured sole of L foot. Area treated with saline wash and ABTx ointment.   Advised to continue to monitor. Change dressing to foot QD.   Administered TdaP immunization.   Tolerated IM administration well.   Immunization record updated.

## 2016-05-12 ENCOUNTER — Telehealth: Payer: Self-pay

## 2016-05-12 NOTE — Telephone Encounter (Signed)
Pt states when he was in the office on 10-23 he received a written Rx for Xarelto and states it was filled for 15mg , but should be 20mg   Ok to change?

## 2016-05-15 MED ORDER — RIVAROXABAN 20 MG PO TABS: 20.0000 mg | ORAL_TABLET | Freq: Every day | ORAL | 3 refills | Status: DC

## 2016-05-15 NOTE — Telephone Encounter (Signed)
Should be on xarelto 20 mg poqday

## 2016-05-15 NOTE — Telephone Encounter (Signed)
Rx refilled.

## 2016-05-25 NOTE — Telephone Encounter (Signed)
Spoke with patient informed him I had fwded all of his information to our billing dept and they will handle from here.

## 2016-05-31 ENCOUNTER — Telehealth: Payer: Self-pay | Admitting: Cardiovascular Disease

## 2016-05-31 NOTE — Telephone Encounter (Signed)
Received cardiac clearance from [facility] for scheduled EGD with septotomy. Surgery scheduled for tomorrow, November 16. Per Dr. Donivan Scull Oct 25 OV notes:  "H/O gastric bypass Complication following surgery in [place], perforation Scheduled for corrective surgery in November at [facility] Acceptable risk, no further testing needed Would stop xarelto 3 days prior to the procedure"  S/w pt today who states he took last dose of Xarelto Saturday, November 11.  Reviewed information w/Dr. Rockey Situ who recommends routing OV notes to [facility].  Routed to Venida Jarvis, RN for Herbie Drape, MD, [phone] Left message for [place] @ [phone]

## 2016-06-22 ENCOUNTER — Ambulatory Visit: Payer: Self-pay | Admitting: Family Medicine

## 2016-08-01 ENCOUNTER — Other Ambulatory Visit: Payer: Self-pay | Admitting: Family Medicine

## 2016-08-01 ENCOUNTER — Telehealth: Payer: Self-pay | Admitting: Family Medicine

## 2016-08-01 MED ORDER — INDOMETHACIN 50 MG PO CAPS: 50.0000 mg | ORAL_CAPSULE | Freq: Three times a day (TID) | ORAL | 0 refills | Status: DC | PRN

## 2016-08-01 NOTE — Telephone Encounter (Signed)
Indomethacin 50 mg by mouth 3 times a day when necessary gout pain, I went ahead and he escribed this to his pharmacy

## 2016-08-01 NOTE — Telephone Encounter (Signed)
Pt called [facility] stating that he is having a GOUT flare and would like something call in for it - he said he is no longer on blood thinner and is post-op >6 months and would like Indomethacin if that is ok with you.

## 2016-08-01 NOTE — Telephone Encounter (Signed)
Pt aware.

## 2016-08-08 ENCOUNTER — Ambulatory Visit: Payer: Self-pay | Admitting: Family Medicine

## 2016-08-10 ENCOUNTER — Encounter: Payer: Self-pay | Admitting: Family Medicine

## 2016-08-10 ENCOUNTER — Ambulatory Visit (INDEPENDENT_AMBULATORY_CARE_PROVIDER_SITE_OTHER): Payer: Self-pay | Admitting: Family Medicine

## 2016-08-10 VITALS — BP 126/84 | HR 80 | Temp 98.3°F | Resp 18 | Ht 72.0 in | Wt 280.0 lb

## 2016-08-10 DIAGNOSIS — J069 Acute upper respiratory infection, unspecified: Secondary | ICD-10-CM

## 2016-08-10 DIAGNOSIS — E119 Type 2 diabetes mellitus without complications: Secondary | ICD-10-CM

## 2016-08-10 DIAGNOSIS — I499 Cardiac arrhythmia, unspecified: Secondary | ICD-10-CM

## 2016-08-10 NOTE — Progress Notes (Signed)
Subjective:    Patient ID: Michael Martinez, male    DOB: Nov 15, 1955, 61 y.o.   MRN: [id number]  HPI Patient is concerned over 4-5 days of cough and chest congestion. He denies any fever. He denies any shortness of breath. He denies any pleurisy. He denies any hemoptysis or purulent sputum. He has had head congestion for which she has been taking Sudafed every 4 hours. Of note on his physical exam today, he has an irregularly irregular heartbeat. EKG today shows normal sinus rhythm with occasional PVCs. I told the patient he is long overdue to recheck a fasting lab work including an A1c and fasting lipid panel. 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 Arthritis    RA  . Bronchitis   . Cellulitis and abscess of lower extremity 03/24/2014   RT LEG  . Cellulitis of right lower extremity    Recurrent  . Chronic diastolic CHF (congestive heart failure) ([place])    a. 11/2013 Echo: EF 45-50%, mild LVH, gr 1 DD, mildly dil LA.  Marland Kitchen Clotting disorder (HCC)    deep vein thrombosis left leg  . DVT (deep venous thrombosis) ([place]) 2006   "after ankle surgery"  . Essential hypertension   . GERD (gastroesophageal reflux disease)   . Gout   . Hypercholesteremia   . Melanoma ([place])    left ear  . Obesity   . OSA on CPAP    severe (38 AHI), CWP 9 cm  . Peripheral edema    chronic  . Pulmonary embolism ([place])   . Shortness of breath   . Type II diabetes mellitus ([place])    Past Surgical History:  Procedure Laterality Date  . gastric sleave    . HERNIA REPAIR    . SKIN CANCER EXCISION    . TONSILLECTOMY     Current Outpatient Prescriptions on File Prior to Visit  Medication Sig Dispense Refill  . Cholecalciferol (VITAMIN D) 2000 units CAPS Take by mouth.    Marland Kitchen HYDROcodone-acetaminophen  (NORCO/VICODIN) 5-325 MG tablet Take 1 tablet by mouth every 8 (eight) hours as needed. (Patient taking differently: Take 1 tablet by mouth every 8 (eight) hours as needed (for GOUT flare). ) 30 tablet 3  . indomethacin (INDOCIN) 50 MG capsule Take 1 capsule (50 mg total) by mouth 3 (three) times daily as needed. 30 capsule 0  . Multiple Vitamins-Minerals (MULTIVITAMIN WITH MINERALS) tablet Take 1 tablet by mouth daily.    Marland Kitchen OVER THE COUNTER MEDICATION Place 1 patch onto the skin daily. Vitamin B 12 patch    . pantoprazole (PROTONIX) 40 MG tablet Take 40 mg by mouth daily.     Marland Kitchen Spacer/Aero-Holding Chambers (AEROCHAMBER PLUS FLO-VU LARGE) MISC Use spacer when using albuterol. 10 each 0  . valACYclovir (VALTREX) 500 MG tablet Take 1 tablet (500 mg total) by mouth 2 (two) times daily as needed (for fever blisters). For 5 days 20 tablet 0  . hydrOXYzine (ATARAX/VISTARIL) 25 MG tablet Take 1 tablet (25 mg total) by mouth at bedtime as needed (sleep). (Patient not taking: Reported on 08/10/2016) 60 tablet 1   No current facility-administered medications on file prior to visit.    Allergies  Allergen Reactions  . Penicillins Anaphylaxis, Hives, Shortness Of Breath and Swelling    Pt  complained of chest tightness, pt very diaphoretic, pt chest, arms red, difficulty breathing, ALL CILLINS, Has patient had a PCN reaction causing immediate rash, facial/tongue/throat swelling, SOB or lightheadedness with hypotension: Yes Has patient had a PCN reaction causing severe rash involving mucus membranes or skin necrosis: No Has patient had a PCN reaction that required hospitalization No Has patient had a PCN reaction occurring within the last 10 years: No If all of the above answers are  . Zosyn [Piperacillin Sod-Tazobactam So] Anaphylaxis, Hives, Shortness Of Breath and Swelling  . Adhesive [Tape] Other (See Comments)    Blood thinners, Please use "paper" tape   Social History   Social History  . Marital  status: Married    Spouse name: N/A  . Number of children: N/A  . Years of education: N/A   Occupational History  . Not on file.   Social History Main Topics  . Smoking status: Never Smoker  . Smokeless tobacco: Never Used  . Alcohol use No  . Drug use: No  . Sexual activity: Not on file   Other Topics Concern  . Not on file   Social History Narrative  . No narrative on file      Review of Systems  All other systems reviewed and are negative.      Objective:   Physical Exam  Constitutional: He appears well-developed and well-nourished. No distress.  HENT:  Right Ear: External ear normal.  Left Ear: External ear normal.  Nose: Nose normal.  Mouth/Throat: Oropharynx is clear and moist. No oropharyngeal exudate.  Eyes: Conjunctivae are normal.  Neck: Neck supple.  Cardiovascular: Normal rate and normal heart sounds.  An irregularly irregular rhythm present.  Pulmonary/Chest: Effort normal and breath sounds normal. No respiratory distress. He has no wheezes. He has no rales.  Lymphadenopathy:    He has no cervical adenopathy.  Skin: He is not diaphoretic.  Vitals reviewed.         Assessment & Plan:  Irregular heartbeat - Plan: EKG 12-Lead  Controlled type 2 diabetes mellitus without complication, without long-term current use of insulin (HCC) - Plan: CBC with Differential/Platelet, COMPLETE METABOLIC PANEL WITH GFR, Hemoglobin A1c, Lipid panel  URI, acute  Patient appears to have a viral upper respiratory infection. I recommended Mucinex DM for cough and chest congestion. I believe the Sudafed is causing PVCs and a recommended he discontinue that. Symptoms should improve with tincture of time. I asked him to return fasting for a CMP, fasting lipid panel, and hemoglobin A1c to monitor his medical conditions after his massive weight loss

## 2016-08-11 ENCOUNTER — Other Ambulatory Visit: Payer: Self-pay

## 2016-08-11 ENCOUNTER — Telehealth: Payer: Self-pay | Admitting: Family Medicine

## 2016-08-11 LAB — CBC WITH DIFFERENTIAL/PLATELET
BASOS PCT: 0 %
Basophils Absolute: 0 cells/uL (ref 0–200)
EOS ABS: 180 {cells}/uL (ref 15–500)
Eosinophils Relative: 4 %
HCT: 38.4 % — ABNORMAL LOW (ref 38.5–50.0)
Hemoglobin: 12.5 g/dL — ABNORMAL LOW (ref 13.0–17.0)
Lymphocytes Relative: 36 %
Lymphs Abs: 1620 cells/uL (ref 850–3900)
MCH: 29.8 pg (ref 27.0–33.0)
MCHC: 32.6 g/dL (ref 32.0–36.0)
MCV: 91.6 fL (ref 80.0–100.0)
MONOS PCT: 12 %
MPV: 9.7 fL (ref 7.5–12.5)
Monocytes Absolute: 540 cells/uL (ref 200–950)
NEUTROS ABS: 2160 {cells}/uL (ref 1500–7800)
Neutrophils Relative %: 48 %
PLATELETS: 219 10*3/uL (ref 140–400)
RBC: 4.19 MIL/uL — ABNORMAL LOW (ref 4.20–5.80)
RDW: 15 % (ref 11.0–15.0)
WBC: 4.5 10*3/uL (ref 3.8–10.8)

## 2016-08-11 NOTE — Telephone Encounter (Signed)
mucinex dm

## 2016-08-11 NOTE — Telephone Encounter (Signed)
Patient aware of providers recommendations.  

## 2016-08-11 NOTE — Telephone Encounter (Signed)
Patient requesting cough medication called in since he was advised not to continue taking sudafed he would like this called into [facility] into [address]. He said he had a rough night last night due to coughing.  CB# [phone]

## 2016-08-11 NOTE — Telephone Encounter (Signed)
Spoke to pt in hall and since he had to stop the pseudoephedrine he was wanting to know what he should be taking for cough and congestion. Has has not tried anything otc cause he did not know what to take.

## 2016-08-12 LAB — COMPLETE METABOLIC PANEL WITH GFR
ALT: 7 U/L — AB (ref 9–46)
AST: 11 U/L (ref 10–35)
Albumin: 3.4 g/dL — ABNORMAL LOW (ref 3.6–5.1)
Alkaline Phosphatase: 102 U/L (ref 40–115)
BILIRUBIN TOTAL: 0.7 mg/dL (ref 0.2–1.2)
BUN: 9 mg/dL (ref 7–25)
CO2: 26 mmol/L (ref 20–31)
CREATININE: 1.04 mg/dL (ref 0.70–1.25)
Calcium: 9.1 mg/dL (ref 8.6–10.3)
Chloride: 101 mmol/L (ref 98–110)
GFR, EST AFRICAN AMERICAN: 89 mL/min (ref >=60)
GFR, Est Non African American: 77 mL/min (ref >=60)
GLUCOSE: 84 mg/dL (ref 70–99)
Potassium: 4.2 mmol/L (ref 3.5–5.3)
Sodium: 140 mmol/L (ref 135–146)
TOTAL PROTEIN: 5.8 g/dL — AB (ref 6.1–8.1)

## 2016-08-12 LAB — LIPID PANEL
CHOL/HDL RATIO: 4 ratio (ref <5.0)
CHOLESTEROL: 171 mg/dL (ref <200)
HDL: 43 mg/dL (ref >40)
LDL Cholesterol: 117 mg/dL — ABNORMAL HIGH (ref <100)
TRIGLYCERIDES: 56 mg/dL (ref <150)
VLDL: 11 mg/dL (ref <30)

## 2016-08-12 LAB — HEMOGLOBIN A1C
Hgb A1c MFr Bld: 5.5 % (ref <5.7)
MEAN PLASMA GLUCOSE: 111 mg/dL

## 2016-08-16 ENCOUNTER — Encounter: Payer: Self-pay | Admitting: Family Medicine

## 2016-08-28 ENCOUNTER — Ambulatory Visit: Payer: Self-pay | Admitting: Physician Assistant

## 2016-09-02 ENCOUNTER — Encounter: Payer: Self-pay | Admitting: Cardiovascular Disease

## 2016-09-04 ENCOUNTER — Ambulatory Visit: Payer: Self-pay | Admitting: Cardiovascular Disease

## 2016-09-11 ENCOUNTER — Encounter: Payer: Self-pay | Admitting: Family Medicine

## 2016-09-11 ENCOUNTER — Other Ambulatory Visit: Payer: Self-pay | Admitting: Family Medicine

## 2016-09-13 MED ORDER — VALACYCLOVIR HCL 500 MG PO TABS: 500.0000 mg | ORAL_TABLET | Freq: Two times a day (BID) | ORAL | 0 refills | Status: DC | PRN

## 2016-09-13 MED ORDER — VARDENAFIL HCL 20 MG PO TABS: 20.0000 mg | ORAL_TABLET | Freq: Every day | ORAL | 3 refills | Status: DC | PRN

## 2016-11-03 ENCOUNTER — Telehealth: Payer: Self-pay | Admitting: *Deleted

## 2016-11-03 NOTE — Telephone Encounter (Signed)
Called pt to schedule his 36mth f/u appt with Dr. Rockey Situ, pt states that he is feeling great and he thinks he doesn't need to come in for that appt, pt wanted to make Dr. Rockey Situ aware. Please advise. Thank you

## 2016-11-03 NOTE — Telephone Encounter (Signed)
That's fine.  He can come in whenever he is ready. Glad he is doing well!

## 2017-01-02 ENCOUNTER — Other Ambulatory Visit: Payer: Self-pay | Admitting: Family Medicine

## 2017-01-02 MED ORDER — VARDENAFIL HCL 20 MG PO TABS: 20.0000 mg | ORAL_TABLET | Freq: Every day | ORAL | 3 refills | Status: DC | PRN

## 2017-01-31 ENCOUNTER — Telehealth: Payer: Self-pay

## 2017-01-31 NOTE — Telephone Encounter (Signed)
Patient left a VM stating he was having nasal congestion and was going out of town the weekend and was wondering if he could get an antibiotic in case his symptoms got worse. Called patient to get more info regarding his symtpoms  LVM for patient to call office and schedule an appointment to be seen because we could not just send in an RX w/o patient being seen first.

## 2017-03-25 IMAGING — CR DG CHEST 2V
2 series · 2 of 2 positions shown · non-contrast
Comparison: January 31, 2016

CLINICAL DATA: Intermittent cough and shortness of breath

EXAM:
CHEST  2 VIEW

[chest pa]
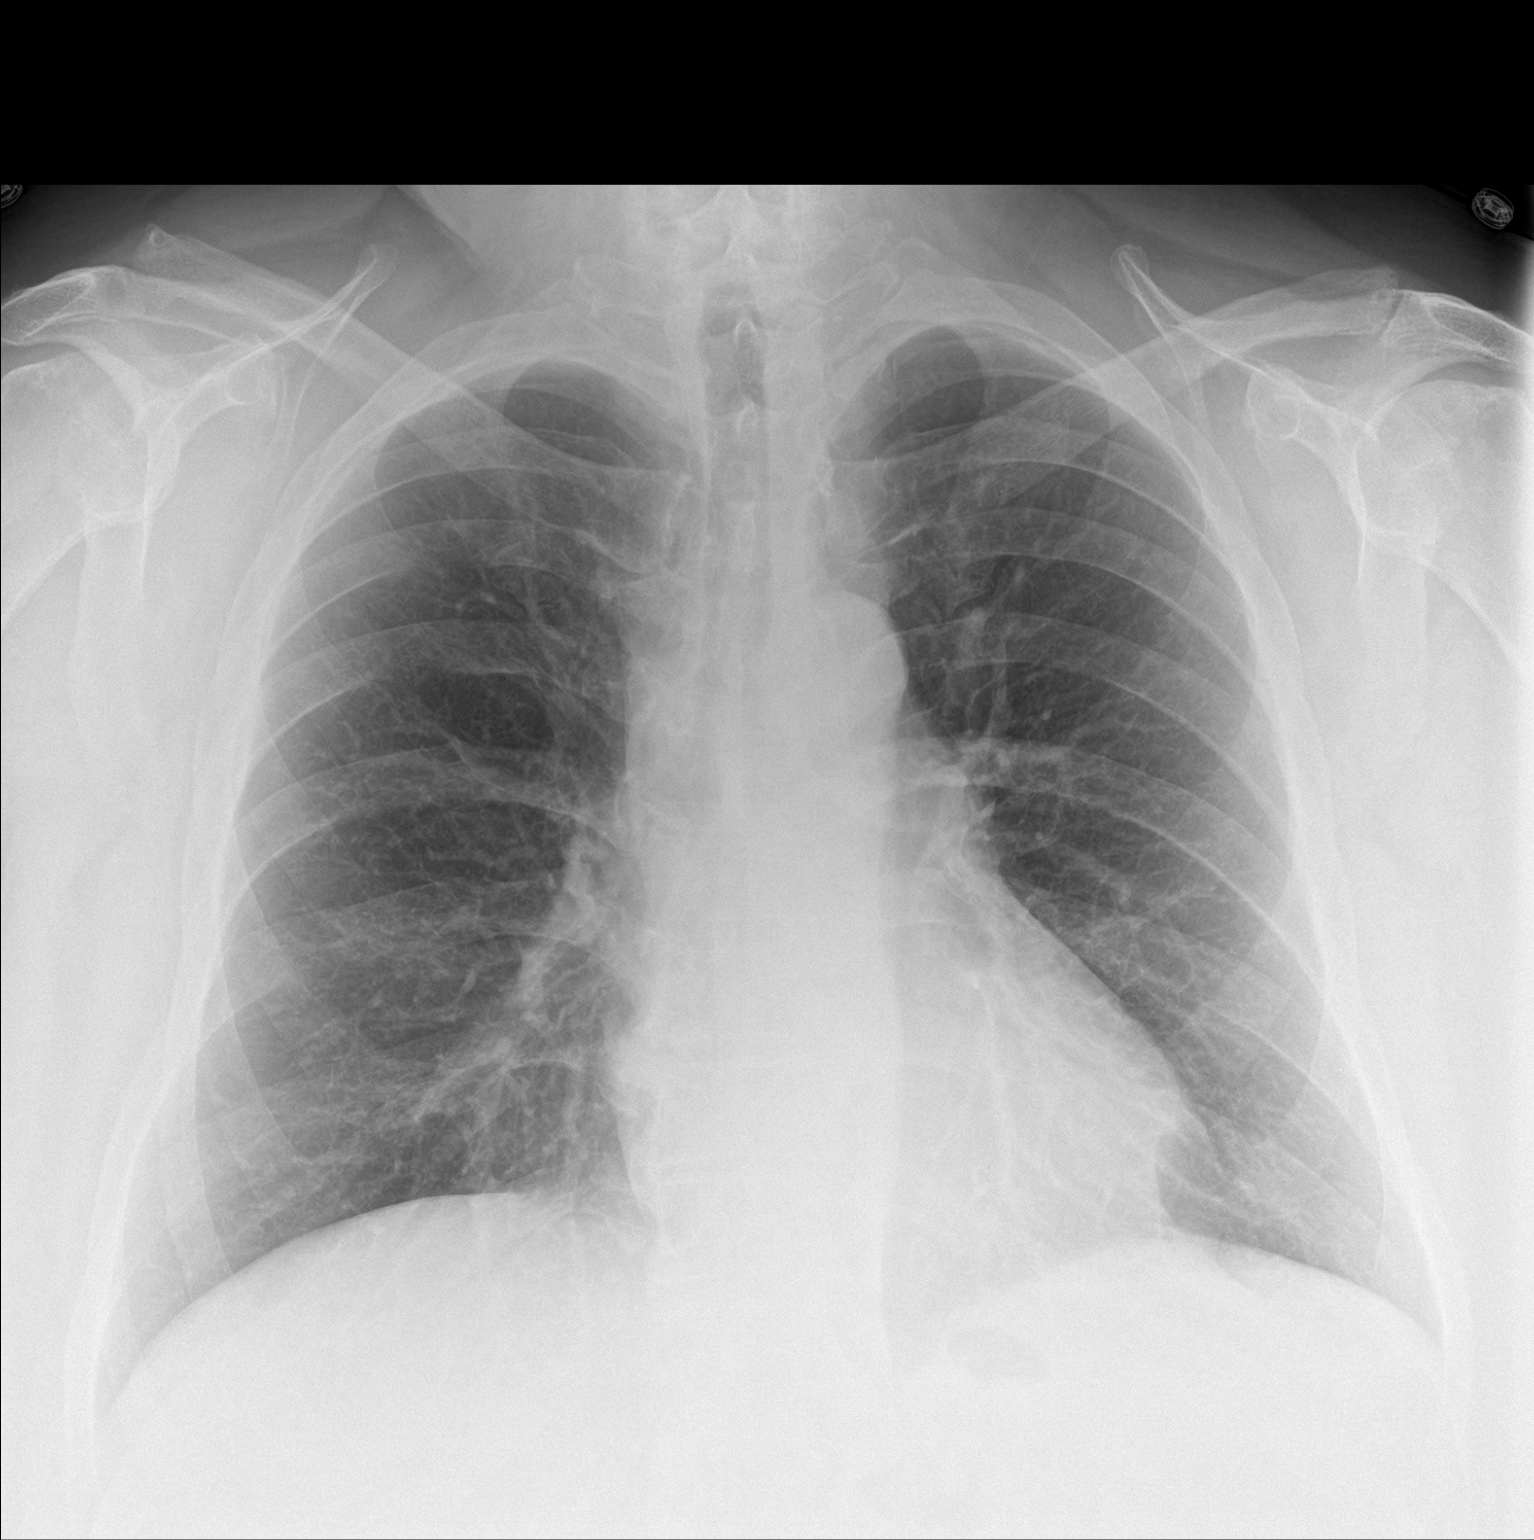

[chest lat]
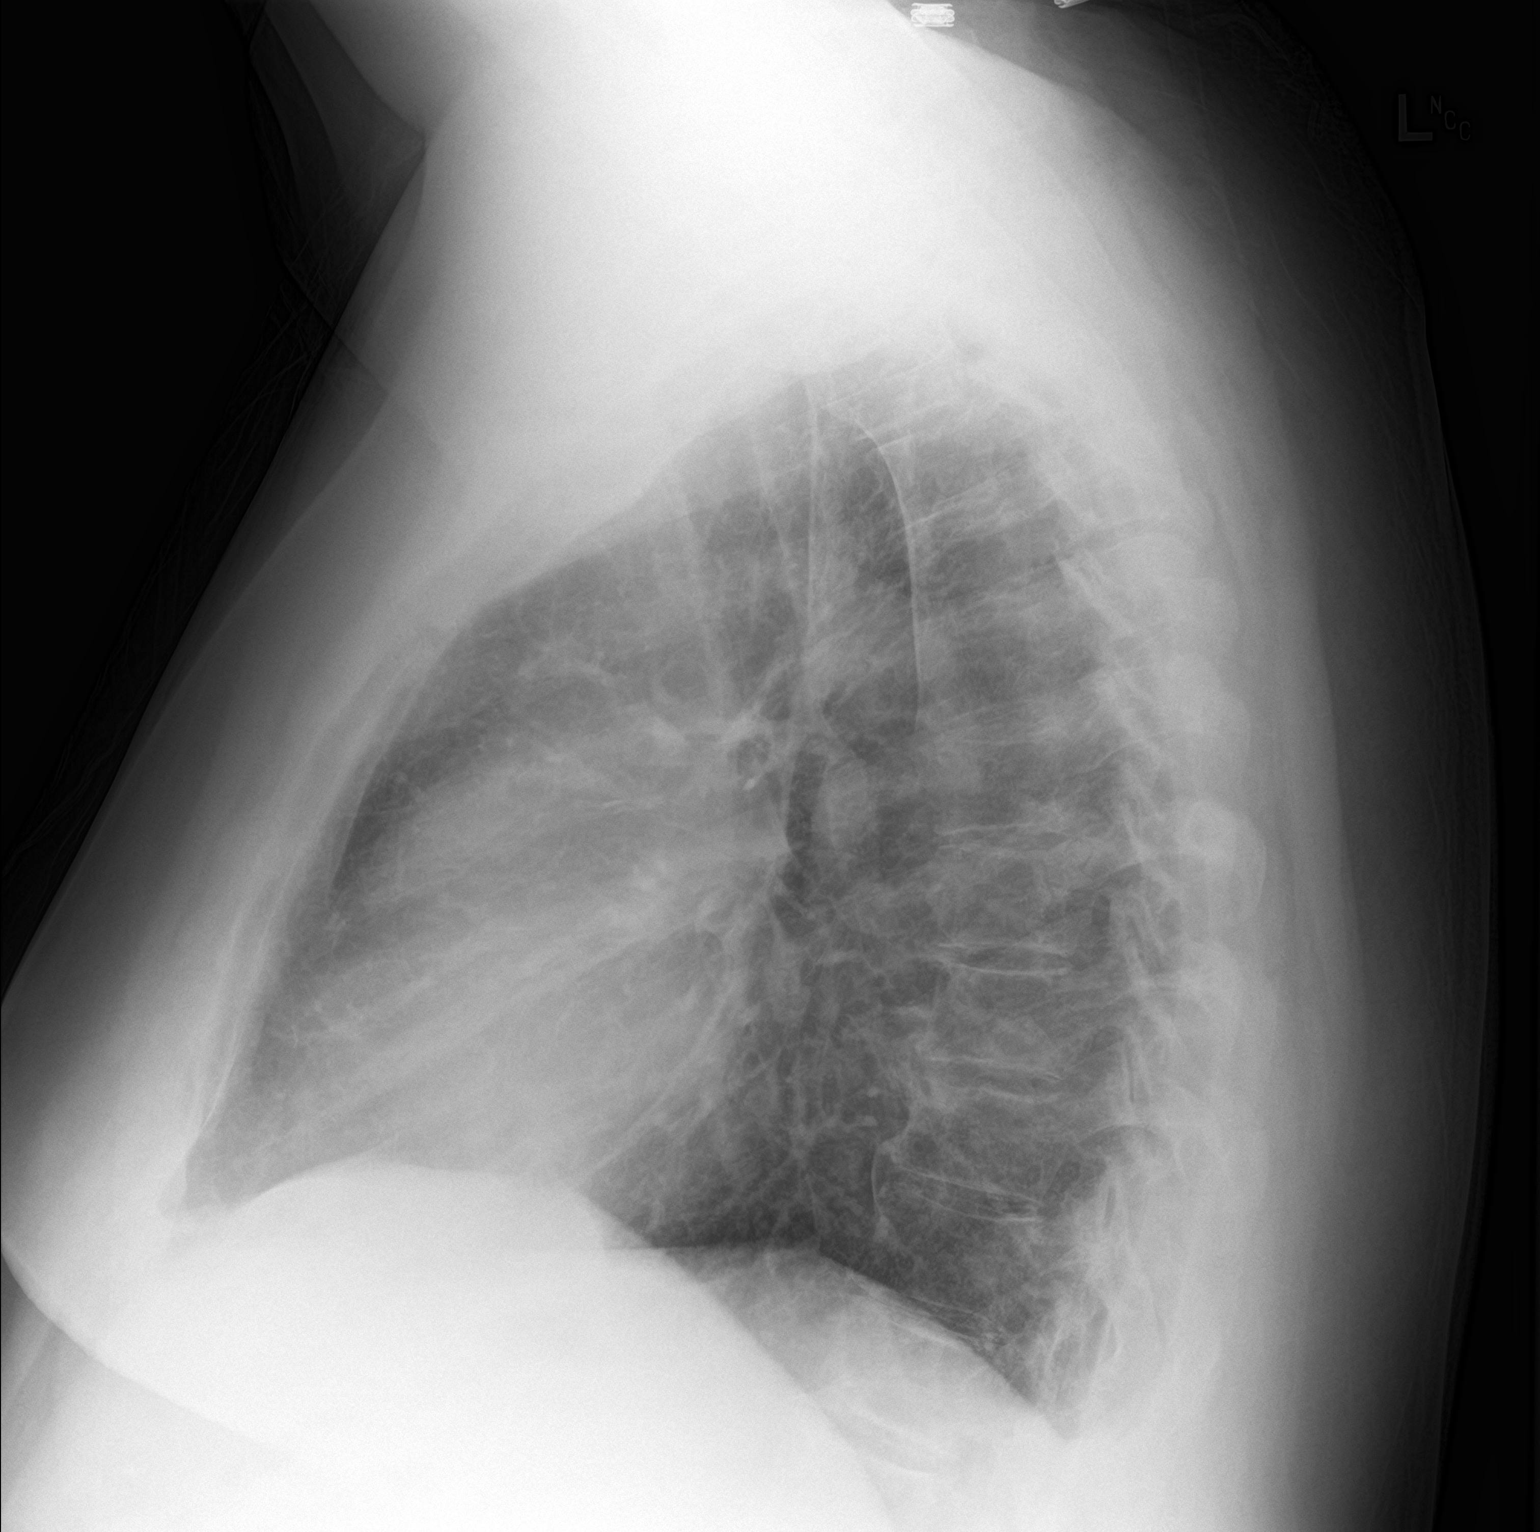

[2 of 2 positions shown; findings below may reference images not displayed]

FINDINGS: The heart size and mediastinal contours are within normal limits.
Both lungs are clear. The visualized skeletal structures are
unremarkable.
IMPRESSION: No active cardiopulmonary disease.

## 2017-03-26 ENCOUNTER — Encounter: Payer: Self-pay | Admitting: Family Medicine

## 2017-03-26 ENCOUNTER — Ambulatory Visit (INDEPENDENT_AMBULATORY_CARE_PROVIDER_SITE_OTHER): Payer: Self-pay | Admitting: Family Medicine

## 2017-03-26 VITALS — BP 118/68 | HR 88 | Temp 98.1°F | Resp 16 | Ht 72.0 in | Wt 257.0 lb

## 2017-03-26 DIAGNOSIS — N509 Disorder of male genital organs, unspecified: Secondary | ICD-10-CM

## 2017-03-26 DIAGNOSIS — N5089 Other specified disorders of the male genital organs: Secondary | ICD-10-CM

## 2017-03-26 DIAGNOSIS — Z23 Encounter for immunization: Secondary | ICD-10-CM

## 2017-03-26 NOTE — Progress Notes (Signed)
Subjective:    Patient ID: Michael Martinez, male    DOB: [birth year], 61 y.o.   MRN: [id number]  HPI  Patient has a large mass in the left side of his scrotum. It is painless. It is soft. He is unsure how long it's been there. He denies any hematuria or dysuria. He denies any penile discharge. Right testicle is normal and nontender without masses. I am unable to appreciate the left testicle due to this large soft cystlike mass. Differential diagnosis includes a large inguinal hernia which I am unable to reduce at the present time due to pain versus a large cystocele. The mass is approximately the size of a baseball Past Medical History:  Diagnosis Date  . Arthritis    RA  . Bronchitis   . Cellulitis and abscess of lower extremity 03/24/2014   RT LEG  . Cellulitis of right lower extremity    Recurrent  . Chronic diastolic CHF (congestive heart failure) ([place])    a. 11/2013 Echo: EF 45-50%, mild LVH, gr 1 DD, mildly dil LA.  Marland Kitchen Clotting disorder (HCC)    deep vein thrombosis left leg  . DVT (deep venous thrombosis) ([place]) 2006   "after ankle surgery"  . Essential hypertension   . GERD (gastroesophageal reflux disease)   . Gout   . Hypercholesteremia   . Melanoma ([place])    left ear  . Obesity   . OSA on CPAP    severe (38 AHI), CWP 9 cm  . Peripheral edema    chronic  . Pulmonary embolism ([place])   . Shortness of breath   . Type II diabetes mellitus ([place])    Past Surgical History:  Procedure Laterality Date  . gastric sleave    . HERNIA REPAIR    . SKIN CANCER EXCISION    . TONSILLECTOMY     Current Outpatient Prescriptions on File Prior to Visit  Medication Sig Dispense Refill  . Cholecalciferol (VITAMIN D) 2000 units CAPS Take by mouth.    Marland Kitchen HYDROcodone-acetaminophen (NORCO/VICODIN) 5-325 MG tablet Take 1 tablet by mouth every 8 (eight) hours as needed. (Patient taking differently: Take 1 tablet by mouth every 8 (eight) hours as needed (for GOUT flare). ) 30 tablet 3  .  indomethacin (INDOCIN) 50 MG capsule TAKE ONE CAPSULE BY MOUTH THREE TIMES DAILY AS NEEDED 30 capsule 5  . Multiple Vitamins-Minerals (MULTIVITAMIN WITH MINERALS) tablet Take 1 tablet by mouth daily.    Marland Kitchen OVER THE COUNTER MEDICATION 1 tablet daily. Vitamin B 12 patch     . Spacer/Aero-Holding Chambers (AEROCHAMBER PLUS FLO-VU LARGE) MISC Use spacer when using albuterol. 10 each 0  . valACYclovir (VALTREX) 500 MG tablet Take 1 tablet (500 mg total) by mouth 2 (two) times daily as needed (for fever blisters). For 5 days 20 tablet 0  . vardenafil (LEVITRA) 20 MG tablet Take 1 tablet (20 mg total) by mouth daily as needed for erectile dysfunction. 40 tablet 3   No current facility-administered medications on file prior to visit.    Allergies  Allergen Reactions  . Penicillins Anaphylaxis, Hives, Shortness Of Breath and Swelling    Pt complained of chest tightness, pt very diaphoretic, pt chest, arms red, difficulty breathing, ALL CILLINS, Has patient had a PCN reaction causing immediate rash, facial/tongue/throat swelling, SOB or lightheadedness with hypotension: Yes Has patient had a PCN reaction causing severe rash involving mucus membranes or skin necrosis: No Has patient had a PCN reaction that required hospitalization No Has  patient had a PCN reaction occurring within the last 10 years: No If all of the above answers are  . Zosyn [Piperacillin Sod-Tazobactam So] Anaphylaxis, Hives, Shortness Of Breath and Swelling  . Adhesive [Tape] Other (See Comments)    Blood thinners, Please use "paper" tape   Social History   Social History  . Marital status: Married    Spouse name: N/A  . Number of children: N/A  . Years of education: N/A   Occupational History  . Not on file.   Social History Main Topics  . Smoking status: Never Smoker  . Smokeless tobacco: Never Used  . Alcohol use No  . Drug use: No  . Sexual activity: Not on file   Other Topics Concern  . Not on file   Social  History Narrative  . No narrative on file     Review of Systems  All other systems reviewed and are negative.      Objective:   Physical Exam  Cardiovascular: Normal rate, regular rhythm and normal heart sounds.   Pulmonary/Chest: Effort normal and breath sounds normal.  Genitourinary: Penis normal.    Right testis shows no mass, no swelling and no tenderness. Left testis shows mass and swelling. Left testis shows no tenderness.  Vitals reviewed.         Assessment & Plan:  Scrotal mass - Plan: US Scrotum  Need for immunization against influenza - Plan: Flu Vaccine QUAD 36+ mos IM  I believe the patient likely has a very large inguinal hernia that has been previously obscured due to his large pannus versus a cystocele. Begin by obtaining an ultrasound of the scrotum

## 2017-03-27 ENCOUNTER — Other Ambulatory Visit: Payer: Self-pay | Admitting: Family Medicine

## 2017-03-27 DIAGNOSIS — N5089 Other specified disorders of the male genital organs: Secondary | ICD-10-CM

## 2017-03-29 ENCOUNTER — Ambulatory Visit
Admission: RE | Admit: 2017-03-29 | Discharge: 2017-03-29 | Disposition: A | Discharge status: Home / Self Care | Payer: Self-pay | Source: Ambulatory Visit | Attending: Family Medicine | Admitting: Family Medicine

## 2017-03-29 DIAGNOSIS — N5089 Other specified disorders of the male genital organs: Secondary | ICD-10-CM

## 2017-04-02 ENCOUNTER — Other Ambulatory Visit: Payer: Self-pay | Admitting: *Deleted

## 2017-04-02 DIAGNOSIS — N433 Hydrocele, unspecified: Secondary | ICD-10-CM

## 2017-04-02 DIAGNOSIS — N5089 Other specified disorders of the male genital organs: Secondary | ICD-10-CM

## 2017-04-15 NOTE — Progress Notes (Signed)
04/16/2017 11:01 AM   Michael Martinez September 01, 1955 [id number]  Referring provider: Susy Frizzle, MD [address] [place], [place] [address]  Chief Complaint  Patient presents with  . New Patient (Initial Visit)    scrotal swelling / Hydrocele Left referred by  Dr. Jenna Luo    HPI: Patient is a Caucasian male who is referred by his PCP, Dr. Susy Frizzle, for a hydrocele.  He states that his left scrotum has been swollen for the last 6-8 months.   He has not had any pain with the hydrocele.  He has noticed the scrotal swelling after he lost 116 lbs after gastris sleeve surgery.   Nothing Has made that scrotal swelling worse or better.  He is not had trauma to the scrotal area. He has not had infections of the scrotum.  He is not having significant urinary symptoms. He does have nocturia, but he sleeps with a CPAP machine. He has not had dysuria, gross hematuria or suprapubic pain.   He has not had fevers, chills, nausea or vomiting.  Scrotal ultrasound performed on 03/29/2017 noted no acute abnormality. No testicular mass or torsion. No evidence of epididymitis/orchitis.  Patient's scrotal asymmetry is due to a large left hydrocele distending the left scrotum.  Right-sided epididymal head cysts or septated cyst, which may reflect a spermatocele. Largest component measures 2.2 x 1.2 x 2.0 cm.  He has not had a recent prostate exam or PSA.   PMH: Past Medical History:  Diagnosis Date  . Arthritis    RA  . Bronchitis   . Cellulitis and abscess of lower extremity 03/24/2014   RT LEG  . Cellulitis of right lower extremity    Recurrent  . Chronic diastolic CHF (congestive heart failure) ([place])    a. 11/2013 Echo: EF 45-50%, mild LVH, gr 1 DD, mildly dil LA.  Marland Kitchen Clotting disorder (HCC)    deep vein thrombosis left leg  . DVT (deep venous thrombosis) ([place]) 2006   "after ankle surgery"  . Essential hypertension   . GERD (gastroesophageal reflux disease)   . Gout    . Hypercholesteremia   . Melanoma ([place])    left ear  . Obesity   . OSA on CPAP    severe (38 AHI), CWP 9 cm  . Peripheral edema    chronic  . Pulmonary embolism ([place])   . Shortness of breath   . Sleep apnea   . Type II diabetes mellitus ([place])     Surgical History: Past Surgical History:  Procedure Laterality Date  . gastric sleave    . HERNIA REPAIR    . SKIN CANCER EXCISION    . TONSILLECTOMY      Home Medications:  Allergies as of 04/16/2017      Reactions   Penicillins Anaphylaxis, Hives, Shortness Of Breath, Swelling   Pt complained of chest tightness, pt very diaphoretic, pt chest, arms red, difficulty breathing, ALL CILLINS, Has patient had a PCN reaction causing immediate rash, facial/tongue/throat swelling, SOB or lightheadedness with hypotension: Yes Has patient had a PCN reaction causing severe rash involving mucus membranes or skin necrosis: No Has patient had a PCN reaction that required hospitalization No Has patient had a PCN reaction occurring within the last 10 years: No If all of the above answers are   Zosyn [piperacillin Sod-tazobactam So] Anaphylaxis, Hives, Shortness Of Breath, Swelling   Adhesive [tape] Other (See Comments)   Blood thinners, Please use "paper" tape  Medication List       Accurate as of 04/16/17 11:01 AM. Always use your most recent med list.          AEROCHAMBER PLUS FLO-VU LARGE Misc Use spacer when using albuterol.   antiseptic oral rinse Liqd 15 mLs by Mouth Rinse route as needed for dry mouth.   CALCIUM PO Take by mouth daily.   ferrous sulfate 325 (65 FE) MG tablet Take 325 mg by mouth every other day.   HYDROcodone-acetaminophen 5-325 MG tablet Commonly known as:  NORCO/VICODIN Take 1 tablet by mouth every 8 (eight) hours as needed.   indomethacin 50 MG capsule Commonly known as:  INDOCIN TAKE ONE CAPSULE BY MOUTH THREE TIMES DAILY AS NEEDED   multivitamin with minerals tablet Take 1 tablet by mouth  daily.   OVER THE COUNTER MEDICATION 1 tablet daily. Vitamin B 12 patch   valACYclovir 500 MG tablet Commonly known as:  VALTREX Take 1 tablet (500 mg total) by mouth 2 (two) times daily as needed (for fever blisters). For 5 days   vardenafil 20 MG tablet Commonly known as:  LEVITRA Take 1 tablet (20 mg total) by mouth daily as needed for erectile dysfunction.   vitamin B-12 500 MCG tablet Commonly known as:  CYANOCOBALAMIN Take 500 mcg by mouth daily.   Vitamin D 2000 units Caps Take by mouth.       Allergies:  Allergies  Allergen Reactions  . Penicillins Anaphylaxis, Hives, Shortness Of Breath and Swelling    Pt complained of chest tightness, pt very diaphoretic, pt chest, arms red, difficulty breathing, ALL CILLINS, Has patient had a PCN reaction causing immediate rash, facial/tongue/throat swelling, SOB or lightheadedness with hypotension: Yes Has patient had a PCN reaction causing severe rash involving mucus membranes or skin necrosis: No Has patient had a PCN reaction that required hospitalization No Has patient had a PCN reaction occurring within the last 10 years: No If all of the above answers are  . Zosyn [Piperacillin Sod-Tazobactam So] Anaphylaxis, Hives, Shortness Of Breath and Swelling  . Adhesive [Tape] Other (See Comments)    Blood thinners, Please use "paper" tape    Family History: Family History  Problem Relation Age of Onset  . Stroke Mother   . Hypertension Mother   . Asthma Mother   . Tuberculosis Mother   . Hypertension Father   . Asthma Sister   . Asthma Sister   . Asthma Daughter   . Prostate cancer Neg Hx   . Kidney cancer Neg Hx   . Bladder Cancer Neg Hx     Social History:  reports that he has never smoked. He has never used smokeless tobacco. He reports that he does not drink alcohol or use drugs.  ROS: UROLOGY Frequent Urination?: No Hard to postpone urination?: No Burning/pain with urination?: No Get up at night to urinate?:  Yes Leakage of urine?: No Urine stream starts and stops?: No Trouble starting stream?: No Do you have to strain to urinate?: No Blood in urine?: No Urinary tract infection?: No Sexually transmitted disease?: No Injury to kidneys or bladder?: No Painful intercourse?: No Weak stream?: No Erection problems?: Yes Penile pain?: No  Gastrointestinal Nausea?: No Vomiting?: No Indigestion/heartburn?: No Diarrhea?: No Constipation?: No  Constitutional Fever: No Night sweats?: No Weight loss?: No Fatigue?: No  Skin Skin rash/lesions?: No Itching?: No  Eyes Blurred vision?: No Double vision?: No  Ears/Nose/Throat Sore throat?: No Sinus problems?: No  Hematologic/Lymphatic Swollen glands?: No Easy bruising?:  Yes  Cardiovascular Leg swelling?: No Chest pain?: No  Respiratory Cough?: No Shortness of breath?: No  Endocrine Excessive thirst?: No  Musculoskeletal Back pain?: No Joint pain?: No  Neurological Headaches?: No Dizziness?: No  Psychologic Depression?: No Anxiety?: No  Physical Exam: BP 131/80   Pulse 69   Ht 6' (1.829 m)   Wt 258 lb 9.6 oz (117.3 kg)   BMI 35.07 kg/m   Constitutional: Well nourished. Alert and oriented, No acute distress. HEENT: [place] AT, moist mucus membranes. Trachea midline, no masses. Cardiovascular: No clubbing, cyanosis, or edema. Respiratory: Normal respiratory effort, no increased work of breathing. GI: Abdomen is soft, non tender, non distended, no abdominal masses. Liver and spleen not palpable.  No hernias appreciated.  Stool sample for occult testing is not indicated.   GU: No CVA tenderness.  No bladder fullness or masses.  Patient with circumcised phallus.  Urethral meatus is patent.  No penile discharge. No penile lesions or rashes. Scrotum without lesions, cysts, rashes and/or edema.  Testicles are located scrotally bilaterally. Per scrotal ultrasound.  No masses are appreciated in the testicles. Left and right  epididymis are normal.  Large left hydrocele noted the size of a grapefruit is noted.   Rectal: Patient with  normal sphincter tone. Anus and perineum without scarring or rashes. No rectal masses are appreciated. Prostate is approximately 35 grams, no nodules are appreciated. Seminal vesicles are normal. Skin: No rashes, bruises or suspicious lesions. Lymph: No cervical or inguinal adenopathy. Neurologic: Grossly intact, no focal deficits, moving all 4 extremities. Psychiatric: Normal mood and affect.  Laboratory Data: Lab Results  Component Value Date   WBC 4.5 08/11/2016   HGB 12.5 (L) 08/11/2016   HCT 38.4 (L) 08/11/2016   MCV 91.6 08/11/2016   PLT 219 08/11/2016    Lab Results  Component Value Date   CREATININE 1.04 08/11/2016    Lab Results  Component Value Date   PSA  04/25/2010    ========== TEST NAME ==========  ========= RESULTS =========  = REFERENCE RANGE =  PROSTATE-SPECIFIC AG.  PSA, Serum (Serial Monitor) Prostate Specific Ag, Serum     [   1.7 ng/mL            ]           0.0-4.0 Roche ECLIA methodology.                                                                      . According to the American Urological Association, Serum PSA should decrease and remain at undetectable levels after radical prostatectomy. The AUA defines biochemical recurrence as an initial PSA value 0.2 ng/mL or greater followed by a subsequent confirmatory PSA value 0.2 ng/mL or greater. Values obtained with different assay methods or kits cannot be used interchangeably. Results cannot be interpreted as absolute evidence of the presence or absence of malignant disease.               [facility]            No: [id number]           [address], [place], [place] [address]           Lindon Romp, MD  [phone]   Result(s) reported on 26 Apr 2010 at 11:48AM.     Lab Results  Component Value Date   HGBA1C 5.5 08/11/2016       Component Value  Date/Time   CHOL 171 08/11/2016 0940   CHOL 181 11/21/2013 0817   HDL 43 08/11/2016 0940   HDL 69 11/21/2013 0817   CHOLHDL 4.0 08/11/2016 0940   VLDL 11 08/11/2016 0940   LDLCALC 117 (H) 08/11/2016 0940   LDLCALC 91 11/21/2013 0817    Lab Results  Component Value Date   AST 11 08/11/2016   Lab Results  Component Value Date   ALT 7 (L) 08/11/2016   I have reviewed the labs.   Pertinent Imaging: CLINICAL DATA:  Scrotal mass.  Left scrotal swelling for 6-8 months.  EXAM: SCROTAL ULTRASOUND  DOPPLER ULTRASOUND OF THE TESTICLES  TECHNIQUE: Complete ultrasound examination of the testicles, epididymis, and other scrotal structures was performed. Color and spectral Doppler ultrasound were also utilized to evaluate blood flow to the testicles.  COMPARISON:  None.  FINDINGS: Right testicle  Measurements: 5.0 x 2.7 x 2.9 cm. No mass or microlithiasis visualized.  Left testicle  Measurements: 4.0 x 2.6 x 3.3 cm. No mass or microlithiasis visualized.  Right epididymis: Septated cysts adjacent to, likely arising from, the epididymal head extending into the superior scrotum. This may reflect spermatocele. No evidence of epididymitis.  Left epididymis:  Normal in size and appearance.  Hydrocele:  Large left hydrocele.  Varicocele:  None visualized.  Pulsed Doppler interrogation of both testes demonstrates normal low resistance arterial and venous waveforms bilaterally.  IMPRESSION: 1. No acute abnormality. No testicular mass or torsion. No evidence of epididymitis/orchitis. 2. Patient's scrotal asymmetry is due to a large left hydrocele distending the left scrotum. 3. Right-sided epididymal head cysts or septated cyst, which may reflect a spermatocele. Largest component measures 2.2 x 1.2 x 2.0 cm.   Electronically Signed   By: Lajean Manes M.D.   On: 03/29/2017 19:48  I have independently reviewed the films.    Assessment & Plan:     1. Left hydrocele  - explained to the patient that a hydrocele is a collection of peritoneal fluid between the parietal and visceral layers of the tunica vaginalis, which directly surrounds the testis and spermatic cord.  It is the same layer that forms the peritoneal lining of the abdomen.  - Hydroceles are believed to arise from an imbalance of secretion and reabsorption of fluid from the tunica vaginalis  - Idiopathic hydrocele, the most common type, generally arises over a long period of time - this is the type Mr. Biglow most likely has  - Most hydroceles do not require intervention and treatment is only indicated in patients who are symptomatic with pain or a pressure sensation or when the scrotal skin integrity is compromised from chronic irritation - he is not experiencing any pain or irritation with the hydrocele at this time  - For asymptomatic patients with hydroceles, there is no need for routine follow-up.  -The most common surgical procedure is excision of the hydrocele sac although there is a 20% to 25% change of return of the hydrocele after the surgical procedure     - I explained that simple aspiration is generally unsuccessful because of the rapid reaccumulation of fluid but may be effective if combined with instillation of a sclerosing agent (eg, tetracycline, alcohol) into the sac. The potential risks of this approach are a low incidence of reactive orchitis/epididymitis  and a higher rate of recurrence, which may then make open surgery more difficult because of the development of adhesions between the hydrocele sac and the scrotal contents.  - He has had clotting issues with surgeries in the past and is reluctant to have surgery at this time  - He would like to observe the hydrocele for the next 6 months and treat conservatively   2. Right spermatocele  - Explained to the patient that this is benign and no intervention is warranted   3. PSA screening  - discussed with the  patient that the AUA panel feels that in men age 35 to 48 years, there was sufficient certainty that the benefits of screening could outweigh the harms that a recommendation of shared decision-making in this age group was justified.  - we discussed the risk vs benefit for him at this time  - he would like a PSA drawn at this visit   - PSA    Return in about 6 months (around 10/15/2017) for I PSS and exam.  These notes generated with voice recognition software. I apologize for typographical errors.  Zara Council, [facility] [address], [place], [place] [address] [phone]

## 2017-04-16 ENCOUNTER — Encounter: Payer: Self-pay | Admitting: Urology

## 2017-04-16 ENCOUNTER — Ambulatory Visit (INDEPENDENT_AMBULATORY_CARE_PROVIDER_SITE_OTHER): Payer: Self-pay | Admitting: Urology

## 2017-04-16 VITALS — BP 131/80 | HR 69 | Ht 72.0 in | Wt 258.6 lb

## 2017-04-16 DIAGNOSIS — N4340 Spermatocele of epididymis, unspecified: Secondary | ICD-10-CM

## 2017-04-16 DIAGNOSIS — Z125 Encounter for screening for malignant neoplasm of prostate: Secondary | ICD-10-CM

## 2017-04-16 DIAGNOSIS — N433 Hydrocele, unspecified: Secondary | ICD-10-CM

## 2017-04-17 ENCOUNTER — Encounter: Payer: Self-pay | Admitting: Urology

## 2017-04-17 ENCOUNTER — Telehealth: Payer: Self-pay | Admitting: *Deleted

## 2017-04-17 LAB — PSA: Prostate Specific Ag, Serum: 0.8 ng/mL (ref 0.0–4.0)

## 2017-04-17 NOTE — Telephone Encounter (Signed)
LMOM for patient to call office back about lab results. 

## 2017-04-17 NOTE — Telephone Encounter (Signed)
-----   Message from Nori Riis, PA-C sent at 04/17/2017 11:55 AM EDT ----- Please let Mr. Brosch know that his PSA was great at 0.8.

## 2017-04-17 NOTE — Telephone Encounter (Signed)
Spoke with patient gave results all questions answered.  Michael Martinez

## 2017-04-23 ENCOUNTER — Other Ambulatory Visit: Payer: Self-pay | Admitting: Family Medicine

## 2017-05-11 ENCOUNTER — Encounter (HOSPITAL_COMMUNITY): Payer: Self-pay | Admitting: Emergency Medicine

## 2017-05-11 ENCOUNTER — Emergency Department (HOSPITAL_COMMUNITY)
Admission: EM | Admit: 2017-05-11 | Discharge: 2017-05-11 | Disposition: A | Discharge status: Home / Self Care | Payer: No Typology Code available for payment source | Attending: Emergency Medicine | Admitting: Emergency Medicine

## 2017-05-11 DIAGNOSIS — I5032 Chronic diastolic (congestive) heart failure: Secondary | ICD-10-CM | POA: Diagnosis not present

## 2017-05-11 DIAGNOSIS — Y999 Unspecified external cause status: Secondary | ICD-10-CM | POA: Insufficient documentation

## 2017-05-11 DIAGNOSIS — Y92410 Unspecified street and highway as the place of occurrence of the external cause: Secondary | ICD-10-CM | POA: Diagnosis not present

## 2017-05-11 DIAGNOSIS — I110 Hypertensive heart disease with heart failure: Secondary | ICD-10-CM | POA: Insufficient documentation

## 2017-05-11 DIAGNOSIS — M549 Dorsalgia, unspecified: Secondary | ICD-10-CM | POA: Insufficient documentation

## 2017-05-11 DIAGNOSIS — Z7984 Long term (current) use of oral hypoglycemic drugs: Secondary | ICD-10-CM | POA: Insufficient documentation

## 2017-05-11 DIAGNOSIS — Z85820 Personal history of malignant melanoma of skin: Secondary | ICD-10-CM | POA: Insufficient documentation

## 2017-05-11 DIAGNOSIS — M542 Cervicalgia: Secondary | ICD-10-CM | POA: Insufficient documentation

## 2017-05-11 DIAGNOSIS — Z79899 Other long term (current) drug therapy: Secondary | ICD-10-CM | POA: Insufficient documentation

## 2017-05-11 DIAGNOSIS — Y9389 Activity, other specified: Secondary | ICD-10-CM | POA: Insufficient documentation

## 2017-05-11 DIAGNOSIS — E119 Type 2 diabetes mellitus without complications: Secondary | ICD-10-CM | POA: Diagnosis not present

## 2017-05-11 MED ORDER — IBUPROFEN 400 MG PO TABS
600.0000 mg | ORAL_TABLET | Freq: Once | ORAL | Status: AC
  Administered 2017-05-11: 600 mg via ORAL
  Filled 2017-05-11: qty 1

## 2017-05-11 MED ORDER — CYCLOBENZAPRINE HCL 10 MG PO TABS
5.0000 mg | ORAL_TABLET | Freq: Once | ORAL | Status: AC
  Administered 2017-05-11: 5 mg via ORAL
  Filled 2017-05-11: qty 1

## 2017-05-11 MED ORDER — CYCLOBENZAPRINE HCL 10 MG PO TABS: 10.0000 mg | ORAL_TABLET | Freq: Two times a day (BID) | ORAL | 0 refills | Status: AC | PRN

## 2017-05-11 MED ORDER — IBUPROFEN 600 MG PO TABS: 600.0000 mg | ORAL_TABLET | Freq: Four times a day (QID) | ORAL | 0 refills | Status: AC | PRN
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
## 2017-06-04 IMAGING — CT CT HEAD W/O CM
3 of 4 series · 15 of 47 positions shown, 18 images · non-contrast
Comparison: None.

CLINICAL DATA: Weakness.  Slurred speech.

EXAM:
CT HEAD WITHOUT CONTRAST
TECHNIQUE: Contiguous axial images were obtained from the base of the skull
through the vertex without intravenous contrast.

[Series 4: head 3.0 mpr · coronal · 0.33mm/px · 3 of 69 slices shown (1 of 2)]
[im 23/69  brain]
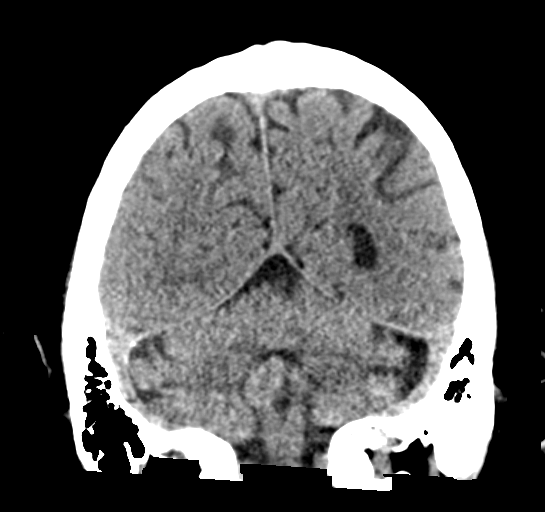
[im 31/69  brain]
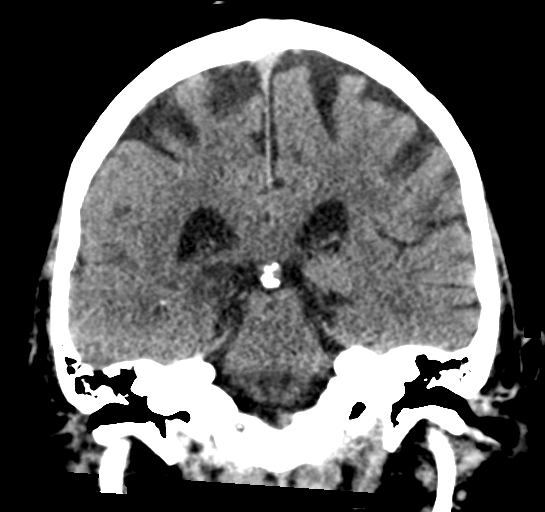
[im 38/69  brain]
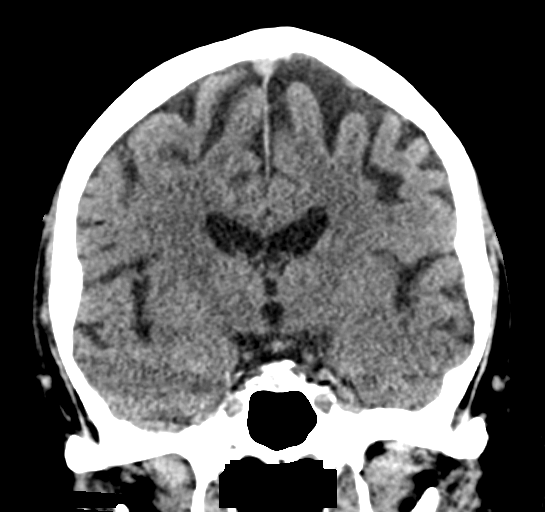

[Series 5: head 3.0 mpr · sagittal · 0.33mm/px · 3 of 56 slices shown (2 of 2)]
[im 19/56  brain]
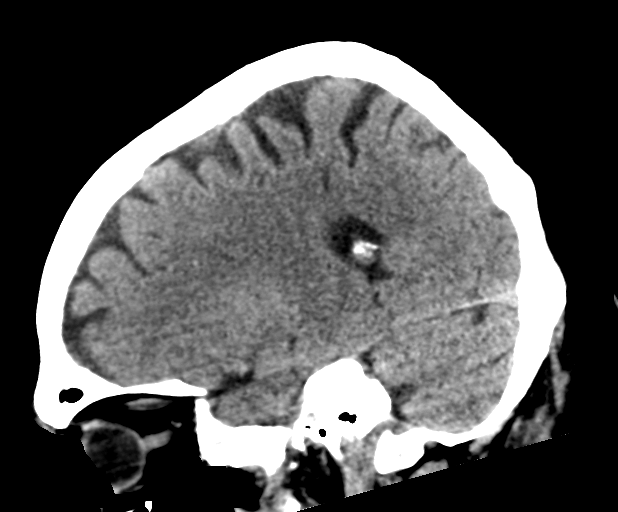
[im 28/56  brain]
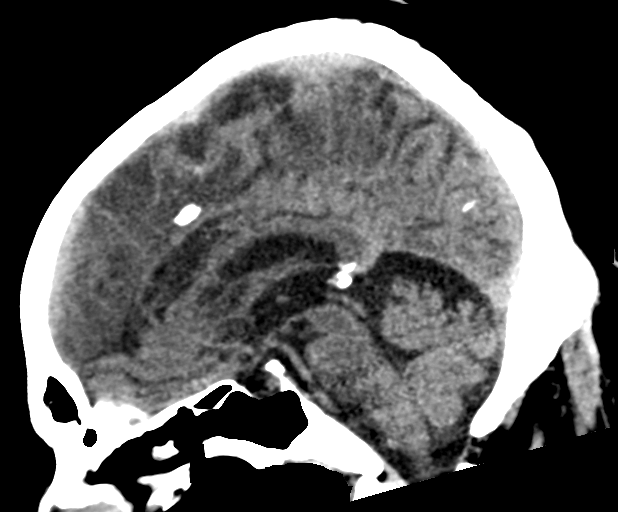
[im 37/56  brain]
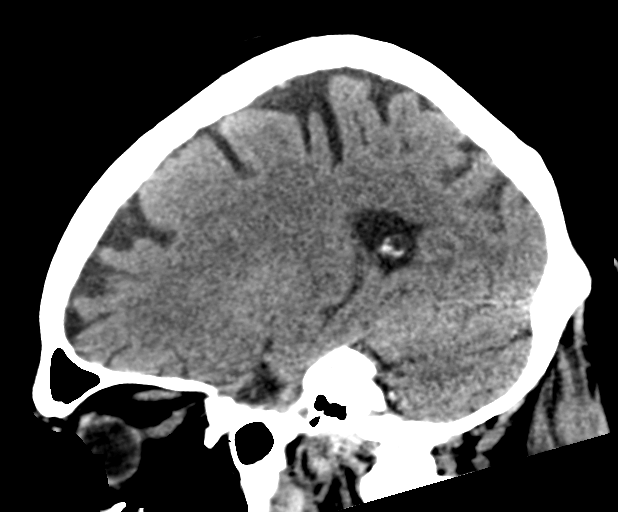

[Series 6: head (id) · axial · 0.35mm/px · z∈[-130,-4]mm · 9 of 84 slices shown, 12 images]
[im 8/84  brain]
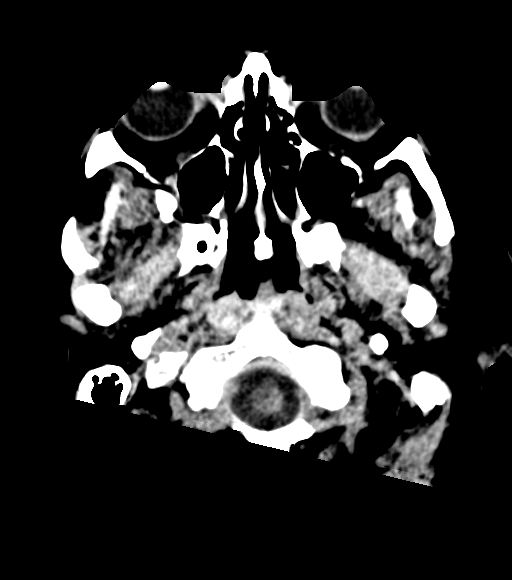
[im 8/84  bone]
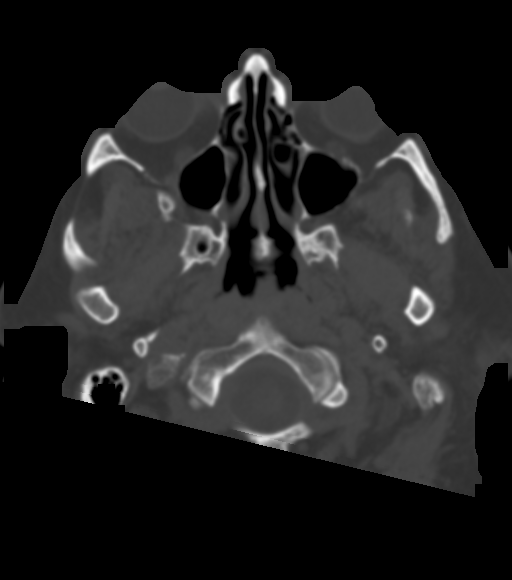
[im 16/84  brain]
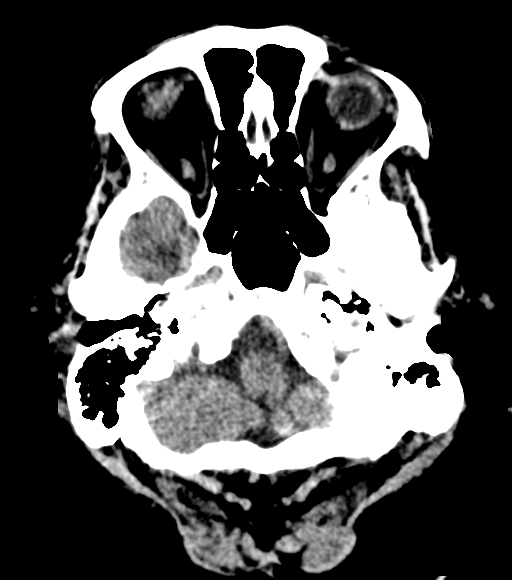
[im 24/84  brain]
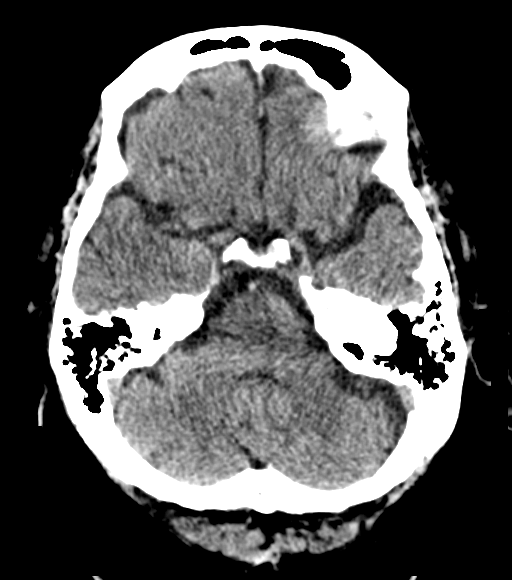
[im 32/84  brain]
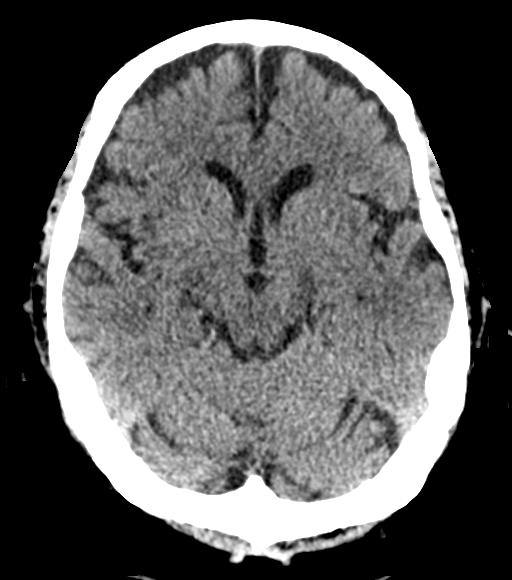
[im 44/84  brain]
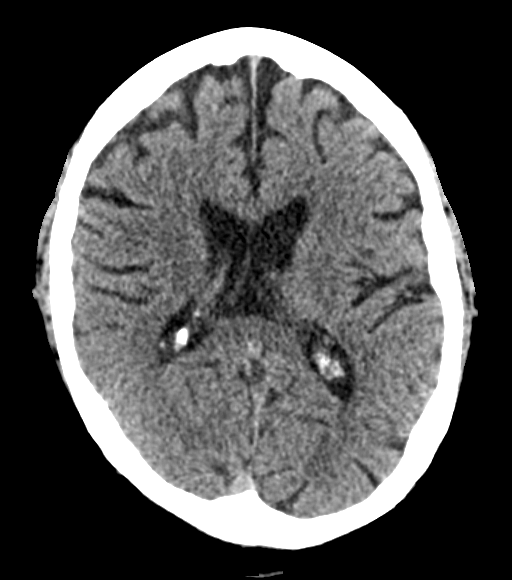
[im 44/84  bone]
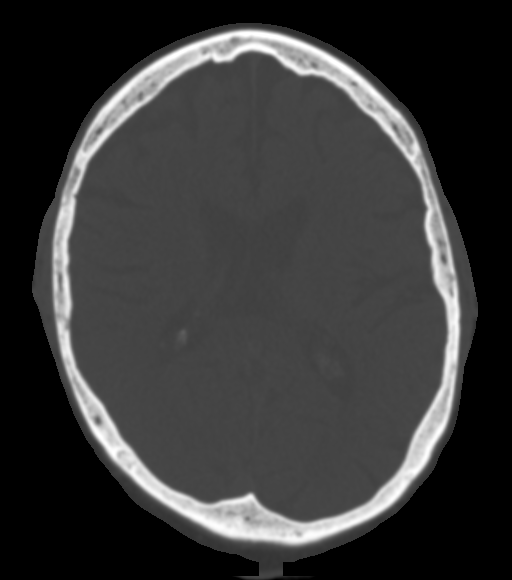
[im 52/84  brain]
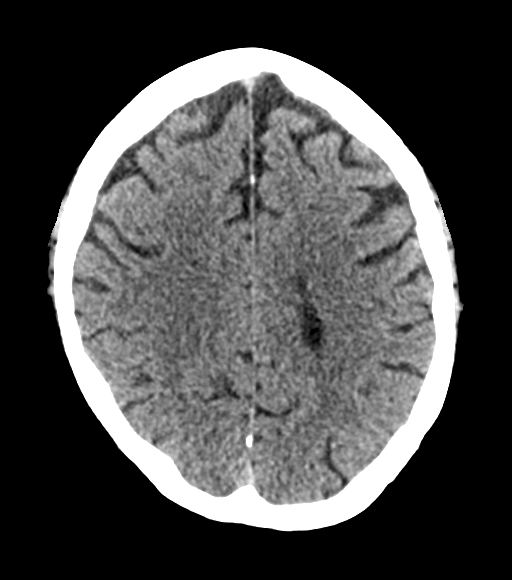
[im 60/84  brain]
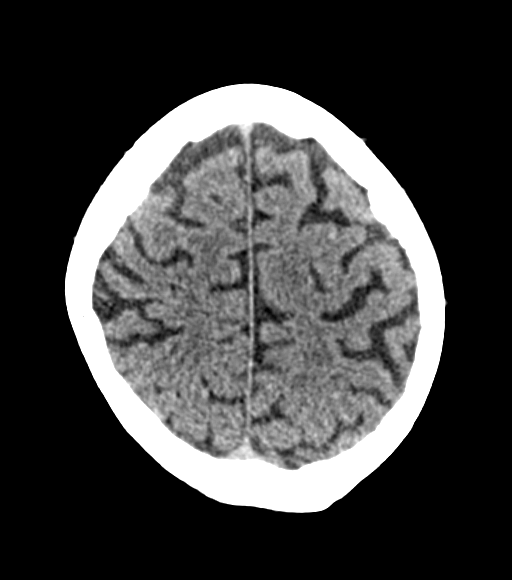
[im 68/84  brain]
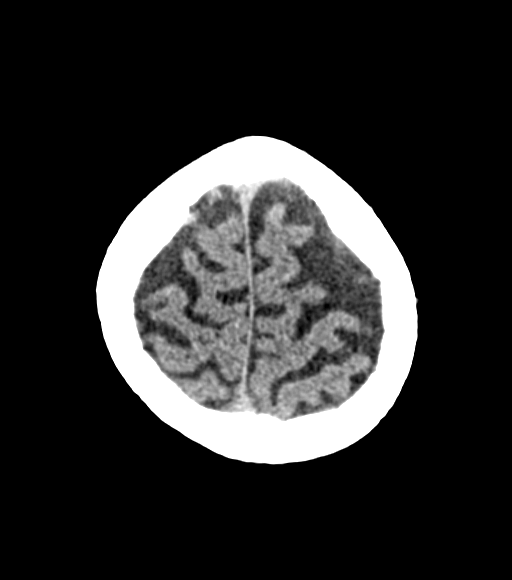
[im 76/84  brain]
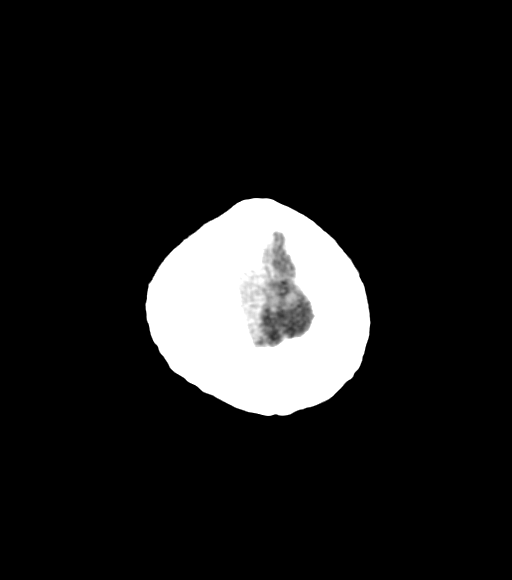
[im 76/84  bone]
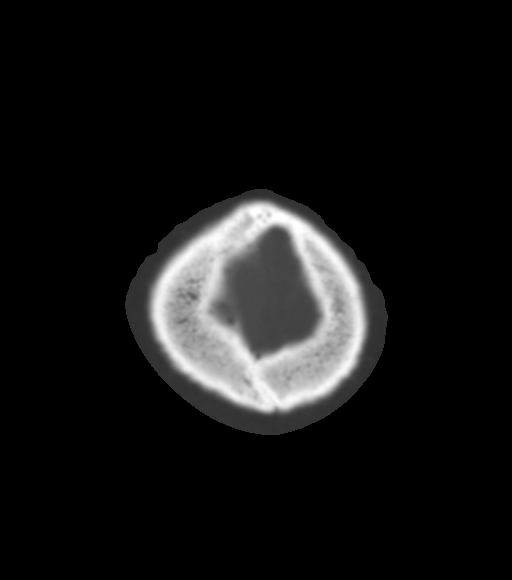

[15 of 47 positions shown; findings below may reference images not displayed]

FINDINGS: Brain: No evidence of acute infarction, hemorrhage, hydrocephalus,
extra-axial collection or mass lesion/mass effect.

Vascular: No hyperdense vessel or unexpected calcification.

Skull: Normal. Negative for fracture or focal lesion.

Sinuses/Orbits: No acute finding.

Other: No other abnormalities.
IMPRESSION: No acute intracranial abnormality.

## 2017-06-05 ENCOUNTER — Other Ambulatory Visit: Payer: Self-pay | Admitting: Family Medicine

## 2017-06-27 ENCOUNTER — Encounter: Payer: Self-pay | Admitting: Family Medicine

## 2017-07-31 NOTE — Progress Notes (Signed)
08/01/2017 10:02 AM   Michael Martinez April 22, 1956 [id number]  Referring provider: Susy Frizzle, MD [address] [place], [place] [address]  Chief Complaint  Patient presents with  . Follow-up  . Hydrocele    HPI: 62 yo WM with a left hydrocele who presents today for worsening of left hydrocele.  .    Background history Patient is a Caucasian male who is referred by his PCP, Dr. Susy Frizzle, for a hydrocele.  He states that his left scrotum has been swollen for the last 6-8 months.   He has not had any pain with the hydrocele.  He has noticed the scrotal swelling after he lost 116 lbs after gastris sleeve surgery.   Nothing Has made that scrotal swelling worse or better.  He is not had trauma to the scrotal area. He has not had infections of the scrotum.  He is not having significant urinary symptoms. He does have nocturia, but he sleeps with a CPAP machine. He has not had dysuria, gross hematuria or suprapubic pain.   He has not had fevers, chills, nausea or vomiting.  Scrotal ultrasound performed on 03/29/2017 noted no acute abnormality. No testicular mass or torsion. No evidence of epididymitis/orchitis.  Patient's scrotal asymmetry is due to a large left hydrocele distending the left scrotum.  Right-sided epididymal head cysts or septated cyst, which may reflect a spermatocele. Largest component measures 2.2 x 1.2 x 2.0 cm.    DRE was normal and PSA was 0.8 in 04/2017  Today, he states that the hydrocele have just become more cumbersome and annoying.  He is finding it difficult to conceal under pants and he is becoming more self-conscious of the appearance.  It is also starting to interfere with sexual activity and urinating as it touches the toilet water when he sits to void.  He has not had redness in the scrotal area.  He has not had any fevers, chills, nausea or vomiting.  He denies any dysuria, gross hematuria or suprapubic pain.  He has baseline nocturia which  is not bothersome to him at this time.  PMH: Past Medical History:  Diagnosis Date  . Arthritis    RA  . Bronchitis   . Cellulitis and abscess of lower extremity 03/24/2014   RT LEG  . Cellulitis of right lower extremity    Recurrent  . Chronic diastolic CHF (congestive heart failure) ([place])    a. 11/2013 Echo: EF 45-50%, mild LVH, gr 1 DD, mildly dil LA.  Marland Kitchen Clotting disorder (HCC)    deep vein thrombosis left leg  . DVT (deep venous thrombosis) ([place]) 2006   "after ankle surgery"  . Essential hypertension   . GERD (gastroesophageal reflux disease)   . Gout   . Hypercholesteremia   . Melanoma ([facility])    left ear  . Obesity   . OSA on CPAP    severe (38 AHI), CWP 9 cm  . Peripheral edema    chronic  . Pulmonary embolism ([facility])   . Shortness of breath   . Sleep apnea   . Type II diabetes mellitus ([facility])     Surgical History: Past Surgical History:  Procedure Laterality Date  . gastric sleave    . HERNIA REPAIR    . SKIN CANCER EXCISION    . TONSILLECTOMY      Home Medications:  Allergies as of 08/01/2017      Reactions   Penicillins Anaphylaxis, Hives, Shortness Of Breath,  Swelling   Pt complained of chest tightness, pt very diaphoretic, pt chest, arms red, difficulty breathing, ALL CILLINS, Has patient had a PCN reaction causing immediate rash, facial/tongue/throat swelling, SOB or lightheadedness with hypotension: Yes Has patient had a PCN reaction causing severe rash involving mucus membranes or skin necrosis: No Has patient had a PCN reaction that required hospitalization No Has patient had a PCN reaction occurring within the last 10 years: No If all of the above answers are   Zosyn [piperacillin Sod-tazobactam So] Anaphylaxis, Hives, Shortness Of Breath, Swelling   Adhesive [tape] Other (See Comments)   Blood thinners, Please use "paper" tape      Medication List        Accurate as of 08/01/17 10:02 AM. Always use your most recent med list.          Biotin  1000 MCG tablet Take by mouth.   CALCIUM PO Take by mouth daily.   ferrous sulfate 325 (65 FE) MG tablet Take 325 mg by mouth every other day.   HYDROcodone-acetaminophen 5-325 MG tablet Commonly known as:  NORCO/VICODIN Take 1 tablet by mouth every 8 (eight) hours as needed.   indomethacin 50 MG capsule Commonly known as:  INDOCIN TAKE ONE CAPSULE BY MOUTH THREE TIMES DAILY AS NEEDED   multivitamin with minerals tablet Take 1 tablet by mouth daily.   OVER THE COUNTER MEDICATION 1 tablet daily. Vitamin B 12 patch   valACYclovir 500 MG tablet Commonly known as:  VALTREX TAKE 1 TABLET BY MOUTH TWICE DAILY AS NEEDED FOR 5 DAYS FOR  FEVER  BLISTERS   vardenafil 20 MG tablet Commonly known as:  LEVITRA Take 1 tablet (20 mg total) by mouth daily as needed for erectile dysfunction.   vitamin B-12 500 MCG tablet Commonly known as:  CYANOCOBALAMIN Take 500 mcg by mouth daily.   Vitamin D 2000 units Caps Take by mouth.       Allergies:  Allergies  Allergen Reactions  . Penicillins Anaphylaxis, Hives, Shortness Of Breath and Swelling    Pt complained of chest tightness, pt very diaphoretic, pt chest, arms red, difficulty breathing, ALL CILLINS, Has patient had a PCN reaction causing immediate rash, facial/tongue/throat swelling, SOB or lightheadedness with hypotension: Yes Has patient had a PCN reaction causing severe rash involving mucus membranes or skin necrosis: No Has patient had a PCN reaction that required hospitalization No Has patient had a PCN reaction occurring within the last 10 years: No If all of the above answers are  . Zosyn [Piperacillin Sod-Tazobactam So] Anaphylaxis, Hives, Shortness Of Breath and Swelling  . Adhesive [Tape] Other (See Comments)    Blood thinners, Please use "paper" tape    Family History: Family History  Problem Relation Age of Onset  . Stroke Mother   . Hypertension Mother   . Asthma Mother   . Tuberculosis Mother   .  Hypertension Father   . Asthma Sister   . Asthma Sister   . Asthma Daughter   . Prostate cancer Neg Hx   . Kidney cancer Neg Hx   . Bladder Cancer Neg Hx     Social History:  reports that  has never smoked. he has never used smokeless tobacco. He reports that he does not drink alcohol or use drugs.  ROS: UROLOGY Frequent Urination?: No Hard to postpone urination?: No Burning/pain with urination?: No Get up at night to urinate?: Yes Leakage of urine?: No Urine stream starts and stops?: No Trouble starting stream?: No  Do you have to strain to urinate?: No Blood in urine?: No Urinary tract infection?: No Sexually transmitted disease?: No Injury to kidneys or bladder?: No Painful intercourse?: No Weak stream?: No Erection problems?: No Penile pain?: No  Gastrointestinal Nausea?: No Vomiting?: No Indigestion/heartburn?: No Diarrhea?: No Constipation?: No  Constitutional Fever: No Night sweats?: No Weight loss?: No Fatigue?: No  Skin Skin rash/lesions?: No Itching?: No  Eyes Blurred vision?: No Double vision?: No  Ears/Nose/Throat Sore throat?: No Sinus problems?: No  Hematologic/Lymphatic Swollen glands?: No Easy bruising?: Yes  Cardiovascular Leg swelling?: No Chest pain?: No  Respiratory Cough?: No Shortness of breath?: No  Endocrine Excessive thirst?: No  Musculoskeletal Back pain?: No Joint pain?: No  Neurological Headaches?: No Dizziness?: No  Psychologic Depression?: No Anxiety?: No  Physical Exam: BP 127/80   Pulse 74   Ht 6' (1.829 m)   Wt 250 lb (113.4 kg)   BMI 33.91 kg/m   Constitutional: Well nourished. Alert and oriented, No acute distress. HEENT: [place] AT, moist mucus membranes. Trachea midline, no masses. Cardiovascular: No clubbing, cyanosis, or edema. Respiratory: Normal respiratory effort, no increased work of breathing. GI: Abdomen is soft, non tender, non distended, no abdominal masses. Liver and spleen not  palpable.  No hernias appreciated.  Stool sample for occult testing is not indicated.   GU: No CVA tenderness.  No bladder fullness or masses.  Patient with circumcised phallus.  Urethral meatus is patent.  No penile discharge. No penile lesions or rashes. Scrotum without lesions, cysts, rashes and/or edema.  Right testicle is scrotally located.  Right epididymis with cyst.  Left hydrocele is present.  Left testicle is not able to be palpated due to the hydrocele.   Rectal: Deferred.   Skin: No rashes, bruises or suspicious lesions. Lymph: No cervical or inguinal adenopathy. Neurologic: Grossly intact, no focal deficits, moving all 4 extremities. Psychiatric: Normal mood and affect.   Laboratory Data: Lab Results  Component Value Date   WBC 4.5 08/11/2016   HGB 12.5 (L) 08/11/2016   HCT 38.4 (L) 08/11/2016   MCV 91.6 08/11/2016   PLT 219 08/11/2016    Lab Results  Component Value Date   CREATININE 1.04 08/11/2016   PSA     0.8 ng/mL on 04/16/2017 Lab Results  Component Value Date   PSA  04/25/2010    ========== TEST NAME ==========  ========= RESULTS =========  = REFERENCE RANGE =  PROSTATE-SPECIFIC AG.  PSA, Serum (Serial Monitor) Prostate Specific Ag, Serum     [   1.7 ng/mL            ]           0.0-4.0 Roche ECLIA methodology.                                                                      . According to the American Urological Association, Serum PSA should decrease and remain at undetectable levels after radical prostatectomy. The AUA defines biochemical recurrence as an initial PSA value 0.2 ng/mL or greater followed by a subsequent confirmatory PSA value 0.2 ng/mL or greater. Values obtained with different assay methods or kits cannot be used interchangeably. Results cannot be interpreted as absolute evidence of the presence or  absence of malignant disease.               [facility]            No: [id number]           [address], [place], [place]  [address]           Lindon Romp, MD         [phone]   Result(s) reported on 26 Apr 2010 at 11:48AM.     Lab Results  Component Value Date   HGBA1C 5.5 08/11/2016       Component Value Date/Time   CHOL 171 08/11/2016 0940   CHOL 181 11/21/2013 0817   HDL 43 08/11/2016 0940   HDL 69 11/21/2013 0817   CHOLHDL 4.0 08/11/2016 0940   VLDL 11 08/11/2016 0940   LDLCALC 117 (H) 08/11/2016 0940   LDLCALC 91 11/21/2013 0817    Lab Results  Component Value Date   AST 11 08/11/2016   Lab Results  Component Value Date   ALT 7 (L) 08/11/2016   I have reviewed the labs.   Pertinent Imaging: CLINICAL DATA:  Scrotal mass.  Left scrotal swelling for 6-8 months.  EXAM: SCROTAL ULTRASOUND  DOPPLER ULTRASOUND OF THE TESTICLES  TECHNIQUE: Complete ultrasound examination of the testicles, epididymis, and other scrotal structures was performed. Color and spectral Doppler ultrasound were also utilized to evaluate blood flow to the testicles.  COMPARISON:  None.  FINDINGS: Right testicle  Measurements: 5.0 x 2.7 x 2.9 cm. No mass or microlithiasis visualized.  Left testicle  Measurements: 4.0 x 2.6 x 3.3 cm. No mass or microlithiasis visualized.  Right epididymis: Septated cysts adjacent to, likely arising from, the epididymal head extending into the superior scrotum. This may reflect spermatocele. No evidence of epididymitis.  Left epididymis:  Normal in size and appearance.  Hydrocele:  Large left hydrocele.  Varicocele:  None visualized.  Pulsed Doppler interrogation of both testes demonstrates normal low resistance arterial and venous waveforms bilaterally.  IMPRESSION: 1. No acute abnormality. No testicular mass or torsion. No evidence of epididymitis/orchitis. 2. Patient's scrotal asymmetry is due to a large left hydrocele distending the left scrotum. 3. Right-sided epididymal head cysts or septated cyst, which may reflect a  spermatocele. Largest component measures 2.2 x 1.2 x 2.0 cm.   Electronically Signed   By: Lajean Manes M.D.   On: 03/29/2017 19:48    Assessment & Plan:    1. Left hydrocele  - patient is wanting definitive treatment at this time in mid - March to early April  - patient is on indomethacin for gouty flares prn  - patient has a history of CHF, DVT and PE's and will need clearance prior to surgery - cardiologist is Dr. Saundra Shelling  - he would like an appointment with Dr. Bernardo Heater or Dr. Erlene Quan to discuss this further  2. Right spermatocele  - Explained to the patient that this is benign and no intervention is warranted   3. PSA screening  - PSA drawn in 04/2017 and it was 0.8   Return for patient will like to have an appointment either Dr. Bernardo Heater or Dr. Erlene Quan to discuss hydrocele surg.  These notes generated with voice recognition software. I apologize for typographical errors.  Zara Council, [facility] [address], [place], [place] [address] [phone]

## 2017-08-01 ENCOUNTER — Encounter: Payer: Self-pay | Admitting: Urology

## 2017-08-01 ENCOUNTER — Ambulatory Visit (INDEPENDENT_AMBULATORY_CARE_PROVIDER_SITE_OTHER): Payer: Self-pay | Admitting: Urology

## 2017-08-01 VITALS — BP 127/80 | HR 74 | Ht 72.0 in | Wt 250.0 lb

## 2017-08-01 DIAGNOSIS — N433 Hydrocele, unspecified: Secondary | ICD-10-CM

## 2017-08-01 DIAGNOSIS — N4340 Spermatocele of epididymis, unspecified: Secondary | ICD-10-CM

## 2017-08-23 ENCOUNTER — Ambulatory Visit: Payer: Self-pay | Admitting: Urology

## 2017-09-18 ENCOUNTER — Ambulatory Visit: Payer: Self-pay | Admitting: Urology

## 2017-10-08 ENCOUNTER — Ambulatory Visit: Payer: Self-pay | Admitting: Urology

## 2017-10-09 ENCOUNTER — Other Ambulatory Visit: Payer: Self-pay | Admitting: Family Medicine

## 2017-10-09 MED ORDER — VARDENAFIL HCL 20 MG PO TABS: 20.0000 mg | ORAL_TABLET | Freq: Every day | ORAL | 3 refills | Status: AC | PRN

## 2017-10-16 ENCOUNTER — Ambulatory Visit: Payer: Self-pay | Admitting: Urology

## 2017-11-05 ENCOUNTER — Ambulatory Visit: Payer: Self-pay | Admitting: Urology

## 2017-11-05 ENCOUNTER — Other Ambulatory Visit: Payer: Self-pay | Admitting: Radiology

## 2017-11-05 ENCOUNTER — Encounter: Payer: Self-pay | Admitting: Urology

## 2017-11-05 VITALS — BP 143/85 | HR 67 | Resp 16 | Ht 72.0 in | Wt 255.4 lb

## 2017-11-05 DIAGNOSIS — N433 Hydrocele, unspecified: Secondary | ICD-10-CM

## 2017-11-05 DIAGNOSIS — N430 Encysted hydrocele: Secondary | ICD-10-CM

## 2017-11-06 NOTE — Progress Notes (Signed)
11/05/2017 7:19 AM   Abbe [place] 1956/04/28 [id number]  Referring provider: Susy Frizzle, MD [address] [place], [place] [address]  Chief Complaint  Patient presents with  . Hydrocele    HPI: 62 year old male who has previously seen Zara Council for a large left hydrocele.  Scrotal ultrasound showed a normal-appearing testis and no evidence of hernia.  He has mild discomfort due to the size.  He presents today to discuss hydrocelectomy.  He has no bothersome lower urinary tract symptoms.  Denies dysuria or gross hematuria.  Denies flank or abdominal pain.   PMH: Past Medical History:  Diagnosis Date  . Arthritis    RA  . Bronchitis   . Cellulitis and abscess of lower extremity 03/24/2014   RT LEG  . Cellulitis of right lower extremity    Recurrent  . Chronic diastolic CHF (congestive heart failure) ([place])    a. 11/2013 Echo: EF 45-50%, mild LVH, gr 1 DD, mildly dil LA.  Marland Kitchen Clotting disorder (HCC)    deep vein thrombosis left leg  . DVT (deep venous thrombosis) ([place]) 2006   "after ankle surgery"  . Essential hypertension   . GERD (gastroesophageal reflux disease)   . Gout   . Hypercholesteremia   . Melanoma ([facility])    left ear  . Obesity   . OSA on CPAP    severe (38 AHI), CWP 9 cm  . Peripheral edema    chronic  . Pulmonary embolism ([facility])   . Shortness of breath   . Sleep apnea   . Type II diabetes mellitus ([facility])     Surgical History: Past Surgical History:  Procedure Laterality Date  . gastric sleave    . HERNIA REPAIR    . SKIN CANCER EXCISION    . TONSILLECTOMY      Home Medications:  Allergies as of 11/05/2017      Reactions   Penicillins Anaphylaxis, Hives, Shortness Of Breath, Swelling   Pt complained of chest tightness, pt very diaphoretic, pt chest, arms red, difficulty breathing, ALL CILLINS, Has patient had a PCN reaction causing immediate rash, facial/tongue/throat swelling, SOB or lightheadedness with hypotension: Yes Has  patient had a PCN reaction causing severe rash involving mucus membranes or skin necrosis: No Has patient had a PCN reaction that required hospitalization No Has patient had a PCN reaction occurring within the last 10 years: No If all of the above answers are   Zosyn [piperacillin Sod-tazobactam So] Anaphylaxis, Hives, Shortness Of Breath, Swelling   Adhesive [tape] Other (See Comments)   Blood thinners, Please use "paper" tape      Medication List        Accurate as of 11/05/17 11:59 PM. Always use your most recent med list.          Biotin 1000 MCG tablet Take by mouth.   CALCIUM PO Take by mouth daily.   ferrous sulfate 325 (65 FE) MG tablet Take 325 mg by mouth every other day.   multivitamin with minerals tablet Take 1 tablet by mouth daily.   OVER THE COUNTER MEDICATION 1 tablet daily. Vitamin B 12 patch   vardenafil 20 MG tablet Commonly known as:  LEVITRA Take 1 tablet (20 mg total) by mouth daily as needed for erectile dysfunction.   vitamin B-12 500 MCG tablet Commonly known as:  CYANOCOBALAMIN Take 500 mcg by mouth daily.   Vitamin D 2000 units Caps Take by mouth.  Allergies:  Allergies  Allergen Reactions  . Penicillins Anaphylaxis, Hives, Shortness Of Breath and Swelling    Pt complained of chest tightness, pt very diaphoretic, pt chest, arms red, difficulty breathing, ALL CILLINS, Has patient had a PCN reaction causing immediate rash, facial/tongue/throat swelling, SOB or lightheadedness with hypotension: Yes Has patient had a PCN reaction causing severe rash involving mucus membranes or skin necrosis: No Has patient had a PCN reaction that required hospitalization No Has patient had a PCN reaction occurring within the last 10 years: No If all of the above answers are  . Zosyn [Piperacillin Sod-Tazobactam So] Anaphylaxis, Hives, Shortness Of Breath and Swelling  . Adhesive [Tape] Other (See Comments)    Blood thinners, Please use "paper" tape     Family History: Family History  Problem Relation Age of Onset  . Stroke Mother   . Hypertension Mother   . Asthma Mother   . Tuberculosis Mother   . Hypertension Father   . Asthma Sister   . Asthma Sister   . Asthma Daughter   . Prostate cancer Neg Hx   . Kidney cancer Neg Hx   . Bladder Cancer Neg Hx     Social History:  reports that he has never smoked. He has never used smokeless tobacco. He reports that he does not drink alcohol or use drugs.  ROS: UROLOGY Frequent Urination?: No Hard to postpone urination?: No Burning/pain with urination?: No Get up at night to urinate?: Yes Leakage of urine?: No Urine stream starts and stops?: No Trouble starting stream?: No Do you have to strain to urinate?: No Blood in urine?: No Urinary tract infection?: No Sexually transmitted disease?: No Injury to kidneys or bladder?: No Painful intercourse?: No Weak stream?: No Erection problems?: No Penile pain?: No  Gastrointestinal Nausea?: No Vomiting?: No Indigestion/heartburn?: No Diarrhea?: No Constipation?: No  Constitutional Fever: No Night sweats?: No Weight loss?: No Fatigue?: No  Skin Skin rash/lesions?: No Itching?: No  Eyes Blurred vision?: No Double vision?: No  Ears/Nose/Throat Sore throat?: No Sinus problems?: No  Hematologic/Lymphatic Swollen glands?: No Easy bruising?: Yes  Cardiovascular Leg swelling?: No Chest pain?: No  Respiratory Cough?: No Shortness of breath?: No  Endocrine Excessive thirst?: No  Musculoskeletal Back pain?: No Joint pain?: No  Neurological Headaches?: No Dizziness?: No  Psychologic Depression?: No Anxiety?: No  Physical Exam: BP (!) 143/85   Pulse 67   Resp 16   Ht 6' (1.829 m)   Wt 255 lb 6.4 oz (115.8 kg)   SpO2 98%   BMI 34.64 kg/m   Constitutional:  Alert and oriented, No acute distress. HEENT: [place] AT, moist mucus membranes.  Trachea midline, no masses. Cardiovascular: No clubbing,  cyanosis, or edema.  RRR Respiratory: Normal respiratory effort, no increased work of breathing.  Lungs clear GI: Abdomen is soft, nontender, nondistended, no abdominal masses GU: No CVA tenderness.  Large left hydrocele.  No evidence of hernia.  Left testis not palpable.  Right testis palpably normal and displaced laterally secondary to the left hydrocele. Skin: No rashes, bruises or suspicious lesions. Neurologic: Grossly intact, no focal deficits, moving all 4 extremities. Psychiatric: Normal mood and affect.  Laboratory Data: Lab Results  Component Value Date   WBC 4.5 08/11/2016   HGB 12.5 (L) 08/11/2016   HCT 38.4 (L) 08/11/2016   MCV 91.6 08/11/2016   PLT 219 08/11/2016    Lab Results  Component Value Date   CREATININE 1.04 08/11/2016    Lab Results  Component  Value Date   PSA  04/25/2010    ========== TEST NAME ==========  ========= RESULTS =========  = REFERENCE RANGE =  PROSTATE-SPECIFIC AG.  PSA, Serum (Serial Monitor) Prostate Specific Ag, Serum     [   1.7 ng/mL            ]           0.0-4.0 Roche ECLIA methodology.                                                                      . According to the American Urological Association, Serum PSA should decrease and remain at undetectable levels after radical prostatectomy. The AUA defines biochemical recurrence as an initial PSA value 0.2 ng/mL or greater followed by a subsequent confirmatory PSA value 0.2 ng/mL or greater. Values obtained with different assay methods or kits cannot be used interchangeably. Results cannot be interpreted as absolute evidence of the presence or absence of malignant disease.               [facility]            No: [id number]           [address] [place], [place] [address]           Lindon Romp, MD         [phone]   Result(s) reported on 26 Apr 2010 at 11:48AM.      Lab Results  Component Value Date   HGBA1C 5.5 08/11/2016    Pertinent  Imaging: Images reviewed Results for orders placed during the hospital encounter of 01/31/16  DG Abd 1 View   Narrative CLINICAL DATA:  Postop, gastric sleeve.  Fever.  EXAM: ABDOMEN - 1 VIEW  COMPARISON:  None.  FINDINGS: The bowel gas pattern is normal. No radio-opaque calculi or other significant radiographic abnormality are seen.  IMPRESSION: Negative.   Electronically Signed   By: Dorise Bullion III M.D   On: 01/31/2016 22:01     Assessment & Plan:   62 year old male with a large, symptomatic left hydrocele.  Management options were discussed including observation, aspiration/sclerotherapy and hydrocelectomy.  Aspiration would have a high failure rate due to the size.  Based on his symptoms he desires to schedule hydrocelectomy.  The procedure was discussed in detail including potential risks of bleeding/hematoma, infection, or recurrence, chronic scrotal pain as well as anesthetic risks.  The need for a drain postoperatively was discussed as well as the likelihood of significant postoperative swelling.  He indicated all questions were answered to his satisfaction and desires to schedule.    Abbie Sons, [facility] [address] [place], [place] [address] [phone]

## 2017-11-06 NOTE — H&P (View-Only) (Signed)
11/05/2017 7:19 AM   Abbe Amsterdam 1956/07/11 [id number]  Referring provider: Susy Frizzle, MD [address] [place], [place] [address]  Chief Complaint  Patient presents with  . Hydrocele    HPI: 62 year old male who has previously seen Zara Council for a large left hydrocele.  Scrotal ultrasound showed a normal-appearing testis and no evidence of hernia.  He has mild discomfort due to the size.  He presents today to discuss hydrocelectomy.  He has no bothersome lower urinary tract symptoms.  Denies dysuria or gross hematuria.  Denies flank or abdominal pain.   PMH: Past Medical History:  Diagnosis Date  . Arthritis    RA  . Bronchitis   . Cellulitis and abscess of lower extremity 03/24/2014   RT LEG  . Cellulitis of right lower extremity    Recurrent  . Chronic diastolic CHF (congestive heart failure) ([facility])    a. 11/2013 Echo: EF 45-50%, mild LVH, gr 1 DD, mildly dil LA.  Marland Kitchen Clotting disorder (HCC)    deep vein thrombosis left leg  . DVT (deep venous thrombosis) ([facility]) 2006   "after ankle surgery"  . Essential hypertension   . GERD (gastroesophageal reflux disease)   . Gout   . Hypercholesteremia   . Melanoma ([facility])    left ear  . Obesity   . OSA on CPAP    severe (38 AHI), CWP 9 cm  . Peripheral edema    chronic  . Pulmonary embolism ([facility])   . Shortness of breath   . Sleep apnea   . Type II diabetes mellitus ([facility])     Surgical History: Past Surgical History:  Procedure Laterality Date  . gastric sleave    . HERNIA REPAIR    . SKIN CANCER EXCISION    . TONSILLECTOMY      Home Medications:  Allergies as of 11/05/2017      Reactions   Penicillins Anaphylaxis, Hives, Shortness Of Breath, Swelling   Pt complained of chest tightness, pt very diaphoretic, pt chest, arms red, difficulty breathing, ALL CILLINS, Has patient had a PCN reaction causing immediate rash, facial/tongue/throat swelling, SOB or lightheadedness with hypotension: Yes Has  patient had a PCN reaction causing severe rash involving mucus membranes or skin necrosis: No Has patient had a PCN reaction that required hospitalization No Has patient had a PCN reaction occurring within the last 10 years: No If all of the above answers are   Zosyn [piperacillin Sod-tazobactam So] Anaphylaxis, Hives, Shortness Of Breath, Swelling   Adhesive [tape] Other (See Comments)   Blood thinners, Please use "paper" tape      Medication List        Accurate as of 11/05/17 11:59 PM. Always use your most recent med list.          Biotin 1000 MCG tablet Take by mouth.   CALCIUM PO Take by mouth daily.   ferrous sulfate 325 (65 FE) MG tablet Take 325 mg by mouth every other day.   multivitamin with minerals tablet Take 1 tablet by mouth daily.   OVER THE COUNTER MEDICATION 1 tablet daily. Vitamin B 12 patch   vardenafil 20 MG tablet Commonly known as:  LEVITRA Take 1 tablet (20 mg total) by mouth daily as needed for erectile dysfunction.   vitamin B-12 500 MCG tablet Commonly known as:  CYANOCOBALAMIN Take 500 mcg by mouth daily.   Vitamin D 2000 units Caps Take by mouth.  Allergies:  Allergies  Allergen Reactions  . Penicillins Anaphylaxis, Hives, Shortness Of Breath and Swelling    Pt complained of chest tightness, pt very diaphoretic, pt chest, arms red, difficulty breathing, ALL CILLINS, Has patient had a PCN reaction causing immediate rash, facial/tongue/throat swelling, SOB or lightheadedness with hypotension: Yes Has patient had a PCN reaction causing severe rash involving mucus membranes or skin necrosis: No Has patient had a PCN reaction that required hospitalization No Has patient had a PCN reaction occurring within the last 10 years: No If all of the above answers are  . Zosyn [Piperacillin Sod-Tazobactam So] Anaphylaxis, Hives, Shortness Of Breath and Swelling  . Adhesive [Tape] Other (See Comments)    Blood thinners, Please use "paper" tape     Family History: Family History  Problem Relation Age of Onset  . Stroke Mother   . Hypertension Mother   . Asthma Mother   . Tuberculosis Mother   . Hypertension Father   . Asthma Sister   . Asthma Sister   . Asthma Daughter   . Prostate cancer Neg Hx   . Kidney cancer Neg Hx   . Bladder Cancer Neg Hx     Social History:  reports that he has never smoked. He has never used smokeless tobacco. He reports that he does not drink alcohol or use drugs.  ROS: UROLOGY Frequent Urination?: No Hard to postpone urination?: No Burning/pain with urination?: No Get up at night to urinate?: Yes Leakage of urine?: No Urine stream starts and stops?: No Trouble starting stream?: No Do you have to strain to urinate?: No Blood in urine?: No Urinary tract infection?: No Sexually transmitted disease?: No Injury to kidneys or bladder?: No Painful intercourse?: No Weak stream?: No Erection problems?: No Penile pain?: No  Gastrointestinal Nausea?: No Vomiting?: No Indigestion/heartburn?: No Diarrhea?: No Constipation?: No  Constitutional Fever: No Night sweats?: No Weight loss?: No Fatigue?: No  Skin Skin rash/lesions?: No Itching?: No  Eyes Blurred vision?: No Double vision?: No  Ears/Nose/Throat Sore throat?: No Sinus problems?: No  Hematologic/Lymphatic Swollen glands?: No Easy bruising?: Yes  Cardiovascular Leg swelling?: No Chest pain?: No  Respiratory Cough?: No Shortness of breath?: No  Endocrine Excessive thirst?: No  Musculoskeletal Back pain?: No Joint pain?: No  Neurological Headaches?: No Dizziness?: No  Psychologic Depression?: No Anxiety?: No  Physical Exam: BP (!) 143/85   Pulse 67   Resp 16   Ht 6' (1.829 m)   Wt 255 lb 6.4 oz (115.8 kg)   SpO2 98%   BMI 34.64 kg/m   Constitutional:  Alert and oriented, No acute distress. HEENT: [place] AT, moist mucus membranes.  Trachea midline, no masses. Cardiovascular: No clubbing,  cyanosis, or edema.  RRR Respiratory: Normal respiratory effort, no increased work of breathing.  Lungs clear GI: Abdomen is soft, nontender, nondistended, no abdominal masses GU: No CVA tenderness.  Large left hydrocele.  No evidence of hernia.  Left testis not palpable.  Right testis palpably normal and displaced laterally secondary to the left hydrocele. Skin: No rashes, bruises or suspicious lesions. Neurologic: Grossly intact, no focal deficits, moving all 4 extremities. Psychiatric: Normal mood and affect.  Laboratory Data: Lab Results  Component Value Date   WBC 4.5 08/11/2016   HGB 12.5 (L) 08/11/2016   HCT 38.4 (L) 08/11/2016   MCV 91.6 08/11/2016   PLT 219 08/11/2016    Lab Results  Component Value Date   CREATININE 1.04 08/11/2016    Lab Results  Component  Value Date   PSA  04/25/2010    ========== TEST NAME ==========  ========= RESULTS =========  = REFERENCE RANGE =  PROSTATE-SPECIFIC AG.  PSA, Serum (Serial Monitor) Prostate Specific Ag, Serum     [   1.7 ng/mL            ]           0.0-4.0 Roche ECLIA methodology.                                                                      . According to the American Urological Association, Serum PSA should decrease and remain at undetectable levels after radical prostatectomy. The AUA defines biochemical recurrence as an initial PSA value 0.2 ng/mL or greater followed by a subsequent confirmatory PSA value 0.2 ng/mL or greater. Values obtained with different assay methods or kits cannot be used interchangeably. Results cannot be interpreted as absolute evidence of the presence or absence of malignant disease.               [facility]            No: [id number]           [address], [place], [place] [address]           Lindon Romp, MD         [phone]   Result(s) reported on 26 Apr 2010 at 11:48AM.      Lab Results  Component Value Date   HGBA1C 5.5 08/11/2016    Pertinent  Imaging: Images reviewed Results for orders placed during the hospital encounter of 01/31/16  DG Abd 1 View   Narrative CLINICAL DATA:  Postop, gastric sleeve.  Fever.  EXAM: ABDOMEN - 1 VIEW  COMPARISON:  None.  FINDINGS: The bowel gas pattern is normal. No radio-opaque calculi or other significant radiographic abnormality are seen.  IMPRESSION: Negative.   Electronically Signed   By: Dorise Bullion III M.D   On: 01/31/2016 22:01     Assessment & Plan:   62 year old male with a large, symptomatic left hydrocele.  Management options were discussed including observation, aspiration/sclerotherapy and hydrocelectomy.  Aspiration would have a high failure rate due to the size.  Based on his symptoms he desires to schedule hydrocelectomy.  The procedure was discussed in detail including potential risks of bleeding/hematoma, infection, or recurrence, chronic scrotal pain as well as anesthetic risks.  The need for a drain postoperatively was discussed as well as the likelihood of significant postoperative swelling.  He indicated all questions were answered to his satisfaction and desires to schedule.    Abbie Sons, [place] [address] [place], [place] [address] [phone]

## 2017-11-11 ENCOUNTER — Encounter: Payer: Self-pay | Admitting: Urology

## 2017-11-16 ENCOUNTER — Encounter: Payer: Self-pay | Admitting: Family Medicine

## 2017-11-16 ENCOUNTER — Ambulatory Visit (INDEPENDENT_AMBULATORY_CARE_PROVIDER_SITE_OTHER): Payer: Self-pay | Admitting: Family Medicine

## 2017-11-16 VITALS — BP 130/80 | HR 66 | Temp 98.1°F | Resp 16 | Ht 72.0 in | Wt 252.0 lb

## 2017-11-16 DIAGNOSIS — J309 Allergic rhinitis, unspecified: Secondary | ICD-10-CM

## 2017-11-16 MED ORDER — FLUTICASONE PROPIONATE 50 MCG/ACT NA SUSP: 2.0000 | Freq: Every day | NASAL | 6 refills | Status: AC

## 2017-11-16 MED ORDER — LEVOCETIRIZINE DIHYDROCHLORIDE 5 MG PO TABS: 5.0000 mg | ORAL_TABLET | Freq: Every evening | ORAL | 0 refills | Status: AC

## 2017-11-16 MED ORDER — AZITHROMYCIN 250 MG PO TABS: ORAL_TABLET | ORAL | 0 refills | Status: DC

## 2017-11-16 NOTE — Progress Notes (Signed)
Subjective:    Patient ID: Michael Martinez, male    DOB: [birth year], 62 y.o.   MRN: [id number]  HPI  Patient symptoms began earlier this week.  Symptoms include rhinorrhea, postnasal drip, sneezing, head congestion.  He denies any sinus pain.  He does have sinus pressure.  He denies any fevers or chills.  He denies any shortness of breath.  He does have postnasal drip causing cough.  Symptoms usually occur in the spring and the fall with similar symptoms.  Patient does not appear ill.  He is requesting an antibiotic because he has surgery coming up next week Past Medical History:  Diagnosis Date  . Arthritis    RA  . Bronchitis   . Cellulitis and abscess of lower extremity 03/24/2014   RT LEG  . Cellulitis of right lower extremity    Recurrent  . Chronic diastolic CHF (congestive heart failure) ([facility])    a. 11/2013 Echo: EF 45-50%, mild LVH, gr 1 DD, mildly dil LA.  Marland Kitchen Clotting disorder (HCC)    deep vein thrombosis left leg  . DVT (deep venous thrombosis) ([facility]) 2006   "after ankle surgery"  . Essential hypertension   . GERD (gastroesophageal reflux disease)   . Gout   . Hypercholesteremia   . Melanoma ([facility])    left ear  . Obesity   . OSA on CPAP    severe (38 AHI), CWP 9 cm  . Peripheral edema    chronic  . Pulmonary embolism ([facility])   . Shortness of breath   . Sleep apnea   . Type II diabetes mellitus ([facility])    Past Surgical History:  Procedure Laterality Date  . gastric sleave    . HERNIA REPAIR    . SKIN CANCER EXCISION    . TONSILLECTOMY     Current Outpatient Medications on File Prior to Visit  Medication Sig Dispense Refill  . CALCIUM PO Take 1,000 mg by mouth daily.     . hydrOXYzine (ATARAX/VISTARIL) 25 MG tablet Take 25 mg by mouth as needed for anxiety (sleep).    . Multiple Vitamins-Minerals (MULTIVITAMIN WITH MINERALS) tablet Take 1 tablet by mouth daily.    Marland Kitchen PSEUDOEPHEDRINE HCL PO Take 30 mg by mouth every 4 (four) hours while awake.    . valACYclovir  (VALTREX) 500 MG tablet Take 500 mg by mouth 2 (two) times daily as needed (fever blister).    . vardenafil (LEVITRA) 20 MG tablet Take 1 tablet (20 mg total) by mouth daily as needed for erectile dysfunction. 40 tablet 3   No current facility-administered medications on file prior to visit.    Allergies  Allergen Reactions  . Penicillins Anaphylaxis, Hives, Shortness Of Breath and Swelling    Pt complained of chest tightness, pt very diaphoretic, pt chest, arms red, difficulty breathing, ALL CILLINS, Has patient had a PCN reaction causing immediate rash, facial/tongue/throat swelling, SOB or lightheadedness with hypotension: Yes Has patient had a PCN reaction causing severe rash involving mucus membranes or skin necrosis: No Has patient had a PCN reaction that required hospitalization No Has patient had a PCN reaction occurring within the last 10 years: No If all of the above answers are  . Zosyn [Piperacillin Sod-Tazobactam So] Anaphylaxis, Hives, Shortness Of Breath and Swelling  . Adhesive [Tape] Other (See Comments)    Blood thinners, Please use "paper" tape   Social History   Socioeconomic History  . Marital status: Married    Spouse name: Not on file  .  Number of children: Not on file  . Years of education: Not on file  . Highest education level: Not on file  Occupational History  . Not on file  Social Needs  . Financial resource strain: Not on file  . Food insecurity:    Worry: Not on file    Inability: Not on file  . Transportation needs:    Medical: Not on file    Non-medical: Not on file  Tobacco Use  . Smoking status: Never Smoker  . Smokeless tobacco: Never Used  Substance and Sexual Activity  . Alcohol use: No    Alcohol/week: 0.0 oz  . Drug use: No  . Sexual activity: Yes    Birth control/protection: None  Lifestyle  . Physical activity:    Days per week: Not on file    Minutes per session: Not on file  . Stress: Not on file  Relationships  . Social  connections:    Talks on phone: Not on file    Gets together: Not on file    Attends religious service: Not on file    Active member of club or organization: Not on file    Attends meetings of clubs or organizations: Not on file    Relationship status: Not on file  . Intimate partner violence:    Fear of current or ex partner: Not on file    Emotionally abused: Not on file    Physically abused: Not on file    Forced sexual activity: Not on file  Other Topics Concern  . Not on file  Social History Narrative  . Not on file     Review of Systems  All other systems reviewed and are negative.      Objective:   Physical Exam  Constitutional: He appears well-developed and well-nourished. No distress.  HENT:  Right Ear: Hearing, tympanic membrane, external ear and ear canal normal.  Left Ear: Hearing, tympanic membrane, external ear and ear canal normal.  Nose: Mucosal edema and rhinorrhea present. Right sinus exhibits no maxillary sinus tenderness and no frontal sinus tenderness. Left sinus exhibits no maxillary sinus tenderness and no frontal sinus tenderness.  Mouth/Throat: Oropharynx is clear and moist.  Eyes: Conjunctivae are normal. Right eye exhibits no discharge. Left eye exhibits no discharge.  Cardiovascular: Normal rate, regular rhythm and normal heart sounds.  Pulmonary/Chest: Effort normal and breath sounds normal. No stridor. No respiratory distress. He has no wheezes. He has no rales.  Lymphadenopathy:    He has no cervical adenopathy.  Skin: He is not diaphoretic.  Vitals reviewed.         Assessment & Plan:  I do not believe the patient has a bacterial sinus infection.  I believe he is dealing with rhinitis secondary to allergies.  I have recommended a combination of Xyzal 5 mg p.o. daily with Flonase 2 sprays each nostril daily.  Should symptoms acutely worsen and he developed severe sinus pain, fever, and sinus headache.  I did give the patient a prescription  for Z-Pak but also gave him strict instructions not to fill unless he develops those other symptoms.

## 2017-11-20 ENCOUNTER — Encounter
Admission: RE | Admit: 2017-11-20 | Discharge: 2017-11-20 | Disposition: A | Discharge status: Home / Self Care | Payer: Self-pay | Source: Ambulatory Visit | Attending: Urology | Admitting: Urology

## 2017-11-20 ENCOUNTER — Telehealth: Payer: Self-pay | Admitting: Urology

## 2017-11-20 ENCOUNTER — Other Ambulatory Visit: Payer: Self-pay

## 2017-11-20 DIAGNOSIS — Z01812 Encounter for preprocedural laboratory examination: Secondary | ICD-10-CM | POA: Insufficient documentation

## 2017-11-20 DIAGNOSIS — R001 Bradycardia, unspecified: Secondary | ICD-10-CM | POA: Insufficient documentation

## 2017-11-20 DIAGNOSIS — Z01818 Encounter for other preprocedural examination: Secondary | ICD-10-CM

## 2017-11-20 DIAGNOSIS — Z86711 Personal history of pulmonary embolism: Secondary | ICD-10-CM | POA: Insufficient documentation

## 2017-11-20 DIAGNOSIS — Z01810 Encounter for preprocedural cardiovascular examination: Secondary | ICD-10-CM | POA: Insufficient documentation

## 2017-11-20 DIAGNOSIS — I440 Atrioventricular block, first degree: Secondary | ICD-10-CM | POA: Insufficient documentation

## 2017-11-20 LAB — BASIC METABOLIC PANEL
Anion gap: 4 — ABNORMAL LOW (ref 5–15)
BUN: 27 mg/dL — ABNORMAL HIGH (ref 6–20)
CALCIUM: 8.8 mg/dL — AB (ref 8.9–10.3)
CO2: 32 mmol/L (ref 22–32)
CREATININE: 1.13 mg/dL (ref 0.61–1.24)
Chloride: 103 mmol/L (ref 101–111)
Glucose, Bld: 87 mg/dL (ref 65–99)
Potassium: 5.1 mmol/L (ref 3.5–5.1)
Sodium: 139 mmol/L (ref 135–145)

## 2017-11-20 LAB — DIFFERENTIAL
BASOS ABS: 0 10*3/uL (ref 0–0.1)
BASOS PCT: 1 %
EOS ABS: 0.2 10*3/uL (ref 0–0.7)
EOS PCT: 3 %
Lymphocytes Relative: 30 %
Lymphs Abs: 1.4 10*3/uL (ref 1.0–3.6)
Monocytes Absolute: 0.6 10*3/uL (ref 0.2–1.0)
Monocytes Relative: 12 %
Neutro Abs: 2.5 10*3/uL (ref 1.4–6.5)
Neutrophils Relative %: 54 %

## 2017-11-20 LAB — CBC
HCT: 41.7 % (ref 40.0–52.0)
Hemoglobin: 14.1 g/dL (ref 13.0–18.0)
MCH: 31 pg (ref 26.0–34.0)
MCHC: 33.8 g/dL (ref 32.0–36.0)
MCV: 91.8 fL (ref 80.0–100.0)
Platelets: 176 10*3/uL (ref 150–440)
RBC: 4.55 MIL/uL (ref 4.40–5.90)
RDW: 14.3 % (ref 11.5–14.5)
WBC: 4.6 10*3/uL (ref 3.8–10.6)

## 2017-11-20 NOTE — Patient Instructions (Signed)
Your procedure is scheduled on: Tues 11/27/17 Report to Day Surgery. To find out your arrival time please call [phone] between 1PM - 3PM on Mon. 11/26/17.  Remember: Instructions that are not followed completely may result in serious medical risk, up to and including death, or upon the discretion of your surgeon and anesthesiologist your surgery may need to be rescheduled.     _X__ 1. Do not eat food after midnight the night before your procedure.                 No gum chewing or hard candies. You may drink clear liquids up to 2 hours                 before you are scheduled to arrive for your surgery- DO not drink clear                 liquids within 2 hours of the start of your surgery.                 Clear Liquids include:  water, apple juice without pulp, clear carbohydrate                 drink such as Clearfast of Gartorade, Black Coffee or Tea (Do not add                 anything to coffee or tea).  __X__2.  On the morning of surgery brush your teeth with toothpaste and water, you may rinse your mouth with mouthwash if you wish.  Do not swallow any              toothpaste of mouthwash.     _X__ 3.  No Alcohol for 24 hours before or after surgery.   ___ 4.  Do Not Smoke or use e-cigarettes For 24 Hours Prior to Your Surgery.                 Do not use any chewable tobacco products for at least 6 hours prior to                 surgery.  ____  5.  Bring all medications with you on the day of surgery if instructed.   __x__  6.  Notify your doctor if there is any change in your medical condition      (cold, fever, infections).     Do not wear jewelry, make-up, hairpins, clips or nail polish. Do not wear lotions, powders, or perfumes. You may wear deodorant. Do not shave 48 hours prior to surgery. Men may shave face and neck. Do not bring valuables to the hospital.    [facility] is not responsible for any belongings or valuables.  Contacts, dentures  or bridgework may not be worn into surgery. Leave your suitcase in the car. After surgery it may be brought to your room. For patients admitted to the hospital, discharge time is determined by your treatment team.   Patients discharged the day of surgery will not be allowed to drive home.   Please read over the following fact sheets that you were given:   __x__ Take these medicines the morning of surgery with A SIP OF WATER:    1. fluticasone (FLONASE) 50 MCG/ACT nasal spray  2.   3.   4.  5.  6.  ____ Fleet Enema (as directed)   ____ Use CHG Soap as directed  ____ Use inhalers on the day of  surgery  ____ Stop metformin 2 days prior to surgery    ____ Take 1/2 of usual insulin dose the night before surgery. No insulin the morning          of surgery.   ____ Stop Coumadin/Plavix/aspirin on   ____ Stop Anti-inflammatories on    ____ Stop supplements until after surgery.    ____ Bring C-Pap to the hospital.

## 2017-11-20 NOTE — Telephone Encounter (Signed)
Pt is scheduled for hydrocelectomy, please advise.

## 2017-11-20 NOTE — Telephone Encounter (Signed)
Pt wants to know if he can get pain medication ahead of time.  He won't take until after procedure.  His procedure is scheduled for 5/14.  He lives 30 minutes from here.  He uses [facility] in [place].  Please give pt a call [phone]

## 2017-11-22 MED ORDER — HYDROCODONE-ACETAMINOPHEN 5-325 MG PO TABS: 1.0000 | ORAL_TABLET | Freq: Four times a day (QID) | ORAL | 0 refills | Status: DC | PRN

## 2017-11-22 NOTE — Telephone Encounter (Signed)
Rx was sent  

## 2017-11-26 MED ORDER — CIPROFLOXACIN IN D5W 400 MG/200ML IV SOLN
400.0000 mg | INTRAVENOUS | Status: AC
  Administered 2017-11-27: 400 mg via INTRAVENOUS

## 2017-11-27 ENCOUNTER — Encounter
Admission: RE | Disposition: A | Discharge status: Home / Self Care | Payer: Self-pay | Source: Ambulatory Visit | Attending: Urology

## 2017-11-27 ENCOUNTER — Ambulatory Visit: Payer: Self-pay | Admitting: Anesthesiology

## 2017-11-27 ENCOUNTER — Ambulatory Visit
Admission: RE | Admit: 2017-11-27 | Discharge: 2017-11-27 | Disposition: A | Discharge status: Home / Self Care | Payer: Self-pay | Source: Ambulatory Visit | Attending: Urology | Admitting: Urology

## 2017-11-27 ENCOUNTER — Encounter: Payer: Self-pay | Admitting: *Deleted

## 2017-11-27 DIAGNOSIS — N430 Encysted hydrocele: Secondary | ICD-10-CM

## 2017-11-27 DIAGNOSIS — M069 Rheumatoid arthritis, unspecified: Secondary | ICD-10-CM | POA: Insufficient documentation

## 2017-11-27 DIAGNOSIS — E669 Obesity, unspecified: Secondary | ICD-10-CM | POA: Insufficient documentation

## 2017-11-27 DIAGNOSIS — N433 Hydrocele, unspecified: Secondary | ICD-10-CM

## 2017-11-27 DIAGNOSIS — Z6834 Body mass index (BMI) 34.0-34.9, adult: Secondary | ICD-10-CM | POA: Insufficient documentation

## 2017-11-27 DIAGNOSIS — Z85820 Personal history of malignant melanoma of skin: Secondary | ICD-10-CM | POA: Insufficient documentation

## 2017-11-27 DIAGNOSIS — D689 Coagulation defect, unspecified: Secondary | ICD-10-CM | POA: Insufficient documentation

## 2017-11-27 DIAGNOSIS — Z86718 Personal history of other venous thrombosis and embolism: Secondary | ICD-10-CM | POA: Insufficient documentation

## 2017-11-27 DIAGNOSIS — Z86711 Personal history of pulmonary embolism: Secondary | ICD-10-CM | POA: Insufficient documentation

## 2017-11-27 HISTORY — PX: HYDROCELE EXCISION: SHX482

## 2017-11-27 SURGERY — HYDROCELECTOMY
Anesthesia: General | Laterality: Left | Wound class: Clean Contaminated

## 2017-11-27 MED ORDER — BUPIVACAINE HCL (PF) 0.5 % IJ SOLN
INTRAMUSCULAR | Status: AC
  Filled 2017-11-27: qty 30

## 2017-11-27 MED ORDER — LIDOCAINE HCL (CARDIAC) PF 100 MG/5ML IV SOSY
PREFILLED_SYRINGE | INTRAVENOUS | Status: DC | PRN
  Administered 2017-11-27: 100 mg via INTRAVENOUS

## 2017-11-27 MED ORDER — FAMOTIDINE 20 MG PO TABS
ORAL_TABLET | ORAL | Status: AC
  Administered 2017-11-27: 20 mg via ORAL
  Filled 2017-11-27: qty 1

## 2017-11-27 MED ORDER — PROPOFOL 10 MG/ML IV BOLUS
INTRAVENOUS | Status: DC | PRN
  Administered 2017-11-27: 50 mg via INTRAVENOUS
  Administered 2017-11-27: 150 mg via INTRAVENOUS

## 2017-11-27 MED ORDER — FENTANYL CITRATE (PF) 100 MCG/2ML IJ SOLN
INTRAMUSCULAR | Status: DC | PRN
  Administered 2017-11-27 (×2): 50 ug via INTRAVENOUS

## 2017-11-27 MED ORDER — LIDOCAINE HCL (PF) 1 % IJ SOLN
INTRAMUSCULAR | Status: AC
  Filled 2017-11-27: qty 30

## 2017-11-27 MED ORDER — FENTANYL CITRATE (PF) 100 MCG/2ML IJ SOLN
INTRAMUSCULAR | Status: AC
  Administered 2017-11-27: 25 ug via INTRAVENOUS
  Filled 2017-11-27: qty 2

## 2017-11-27 MED ORDER — EPHEDRINE SULFATE 50 MG/ML IJ SOLN
INTRAMUSCULAR | Status: DC | PRN
  Administered 2017-11-27 (×3): 10 mg via INTRAVENOUS

## 2017-11-27 MED ORDER — BACITRACIN ZINC 500 UNIT/GM EX OINT
TOPICAL_OINTMENT | CUTANEOUS | Status: AC
  Filled 2017-11-27: qty 28.35

## 2017-11-27 MED ORDER — FAMOTIDINE 20 MG PO TABS
20.0000 mg | ORAL_TABLET | Freq: Once | ORAL | Status: AC
  Administered 2017-11-27: 20 mg via ORAL

## 2017-11-27 MED ORDER — FENTANYL CITRATE (PF) 100 MCG/2ML IJ SOLN
INTRAMUSCULAR | Status: AC
  Filled 2017-11-27: qty 2

## 2017-11-27 MED ORDER — BUPIVACAINE HCL 0.5 % IJ SOLN
INTRAMUSCULAR | Status: DC | PRN
  Administered 2017-11-27: 6 mL

## 2017-11-27 MED ORDER — ONDANSETRON HCL 4 MG/2ML IJ SOLN
INTRAMUSCULAR | Status: DC | PRN
  Administered 2017-11-27: 4 mg via INTRAVENOUS

## 2017-11-27 MED ORDER — MIDAZOLAM HCL 2 MG/2ML IJ SOLN
INTRAMUSCULAR | Status: DC | PRN
  Administered 2017-11-27: 2 mg via INTRAVENOUS

## 2017-11-27 MED ORDER — HYDROCODONE-ACETAMINOPHEN 5-325 MG PO TABS
1.0000 | ORAL_TABLET | Freq: Once | ORAL | Status: AC
  Administered 2017-11-27: 1 via ORAL

## 2017-11-27 MED ORDER — HYDROCODONE-ACETAMINOPHEN 5-325 MG PO TABS
ORAL_TABLET | ORAL | Status: AC
  Filled 2017-11-27: qty 1

## 2017-11-27 MED ORDER — DEXAMETHASONE SODIUM PHOSPHATE 10 MG/ML IJ SOLN
INTRAMUSCULAR | Status: DC | PRN
  Administered 2017-11-27: 5 mg via INTRAVENOUS

## 2017-11-27 MED ORDER — CIPROFLOXACIN IN D5W 400 MG/200ML IV SOLN
INTRAVENOUS | Status: AC
  Filled 2017-11-27: qty 200

## 2017-11-27 MED ORDER — FENTANYL CITRATE (PF) 100 MCG/2ML IJ SOLN
25.0000 ug | INTRAMUSCULAR | Status: AC | PRN
  Administered 2017-11-27 (×6): 25 ug via INTRAVENOUS

## 2017-11-27 MED ORDER — ONDANSETRON HCL 4 MG/2ML IJ SOLN: 4.0000 mg | Freq: Once | INTRAMUSCULAR | Status: DC | PRN

## 2017-11-27 MED ORDER — MIDAZOLAM HCL 2 MG/2ML IJ SOLN
INTRAMUSCULAR | Status: AC
  Filled 2017-11-27: qty 2

## 2017-11-27 MED ORDER — LACTATED RINGERS IV SOLN
INTRAVENOUS | Status: DC
  Administered 2017-11-27: 10:00:00 via INTRAVENOUS

## 2017-11-27 MED ORDER — SULFAMETHOXAZOLE-TRIMETHOPRIM 800-160 MG PO TABS: 1.0000 | ORAL_TABLET | Freq: Two times a day (BID) | ORAL | 0 refills | Status: DC

## 2017-11-27 SURGICAL SUPPLY — 40 items
ADH SKN CLS APL DERMABOND .7 (GAUZE/BANDAGES/DRESSINGS) ×1
BLADE CLIPPER SURG (BLADE) ×3 IMPLANT
BLADE SURG 15 STRL LF DISP TIS (BLADE) ×1 IMPLANT
BLADE SURG 15 STRL SS (BLADE) ×3
CANISTER SUCT 1200ML W/VALVE (MISCELLANEOUS) ×3 IMPLANT
CHLORAPREP W/TINT 26ML (MISCELLANEOUS) ×3 IMPLANT
DERMABOND ADVANCED (GAUZE/BANDAGES/DRESSINGS) ×2
DERMABOND ADVANCED .7 DNX12 (GAUZE/BANDAGES/DRESSINGS) ×1 IMPLANT
DRAIN PENROSE 1/4X12 LTX (DRAIN) ×2 IMPLANT
DRAPE LAPAROTOMY 77X122 PED (DRAPES) ×3 IMPLANT
DRSG GAUZE FLUFF 36X18 (GAUZE/BANDAGES/DRESSINGS) ×3 IMPLANT
ELECT REM PT RETURN 9FT ADLT (ELECTROSURGICAL) ×3
ELECTRODE REM PT RTRN 9FT ADLT (ELECTROSURGICAL) ×1 IMPLANT
GAUZE SPONGE 4X4 12PLY STRL (GAUZE/BANDAGES/DRESSINGS) ×3 IMPLANT
GLOVE BIO SURGEON STRL SZ8 (GLOVE) ×3 IMPLANT
GOWN STRL REUS W/ TWL LRG LVL3 (GOWN DISPOSABLE) ×1 IMPLANT
GOWN STRL REUS W/TWL LRG LVL3 (GOWN DISPOSABLE) ×3
GOWN STRL REUS W/TWL XL LVL4 (GOWN DISPOSABLE) ×5 IMPLANT
KIT TURNOVER KIT A (KITS) ×3 IMPLANT
LABEL OR SOLS (LABEL) ×3 IMPLANT
NDL HYPO 25X1 1.5 SAFETY (NEEDLE) ×1 IMPLANT
NEEDLE HYPO 25X1 1.5 SAFETY (NEEDLE) ×3 IMPLANT
NS IRRIG 500ML POUR BTL (IV SOLUTION) ×3 IMPLANT
PACK BASIN MINOR ARMC (MISCELLANEOUS) ×3 IMPLANT
SUPPORETR ATHLETIC LG (MISCELLANEOUS) IMPLANT
SUPPORTER ATHLETIC LG (MISCELLANEOUS)
SUPPORTER ATHLETIC XL (MISCELLANEOUS) ×3
SUPPORTER ATHLETIC XL 3X44-50X (MISCELLANEOUS) IMPLANT
SUT CHROMIC 3 0 PS 2 (SUTURE) ×4 IMPLANT
SUT CHROMIC 3 0 SH 27 (SUTURE) ×3 IMPLANT
SUT ETHILON 3-0 FS-10 30 BLK (SUTURE)
SUT ETHILON NAB PS2 4-0 18IN (SUTURE) ×3 IMPLANT
SUT MNCRL 3-0 UNDYED SH (SUTURE) ×1 IMPLANT
SUT MONOCRYL 3-0 UNDYED (SUTURE) ×2
SUT VIC AB 3-0 SH 27 (SUTURE) ×3
SUT VIC AB 3-0 SH 27X BRD (SUTURE) ×2 IMPLANT
SUT VIC AB 4-0 SH 27 (SUTURE) ×3
SUT VIC AB 4-0 SH 27XANBCTRL (SUTURE) ×1 IMPLANT
SUTURE EHLN 3-0 FS-10 30 BLK (SUTURE) IMPLANT
SYR 10ML LL (SYRINGE) ×3 IMPLANT

## 2017-11-27 NOTE — Interval H&P Note (Signed)
History and Physical Interval Note:  11/27/2017 9:48 AM  Michael Martinez  has presented today for surgery, with the diagnosis of left hydrocele  The various methods of treatment have been discussed with the patient and family. After consideration of risks, benefits and other options for treatment, the patient has consented to  Procedure(s): HYDROCELECTOMY ADULT (Left) as a surgical intervention .  The patient's history has been reviewed, patient examined, no change in status, stable for surgery.  I have reviewed the patient's chart and labs.  Questions were answered to the patient's satisfaction.     [place]

## 2017-11-27 NOTE — Discharge Instructions (Signed)

## 2017-11-27 NOTE — Anesthesia Preprocedure Evaluation (Signed)
Anesthesia Evaluation  Patient identified by MRN, date of birth, ID band Patient awake    Reviewed: Allergy & Precautions, NPO status , Patient's Chart, lab work & pertinent test results  History of Anesthesia Complications Negative for: history of anesthetic complications  Airway Mallampati: II       Dental   Pulmonary sleep apnea (prior to bypass surgery) , neg COPD,           Cardiovascular hypertension (resolved after gastric bypass), +CHF       Neuro/Psych neg Seizures    GI/Hepatic Neg liver ROS, GERD (reolved after gastric bypass)  ,  Endo/Other  diabetes (resolved after gastric bypass)  Renal/GU negative Renal ROS     Musculoskeletal   Abdominal   Peds  Hematology   Anesthesia Other Findings   Reproductive/Obstetrics                            Anesthesia Physical Anesthesia Plan  ASA: II  Anesthesia Plan: General   Post-op Pain Management:    Induction: Intravenous  PONV Risk Score and Plan: 2 and Dexamethasone and Ondansetron  Airway Management Planned: LMA  Additional Equipment:   Intra-op Plan:   Post-operative Plan:   Informed Consent: I have reviewed the patients History and Physical, chart, labs and discussed the procedure including the risks, benefits and alternatives for the proposed anesthesia with the patient or authorized representative who has indicated his/her understanding and acceptance.     Plan Discussed with:   Anesthesia Plan Comments:         Anesthesia Quick Evaluation

## 2017-11-27 NOTE — Anesthesia Post-op Follow-up Note (Signed)
Anesthesia QCDR form completed.        

## 2017-11-27 NOTE — Op Note (Signed)
Preoperative diagnosis:  1. Left hydrocele  Postoperative diagnosis:  1. Left hydrocele  Procedure: 1. Left hydrocelectomy  Surgeon: Abbie Sons, MD  Anesthesia: General  Complications: None  Intraoperative findings: Large left hydrocele  EBL: Minimal  Specimens: None  Indication: Michael Martinez is a 62 y.o. patient with a large left hydrocele.  After reviewing the management options for treatment, he elected to proceed with the above surgical procedure(s). We have discussed the potential benefits and risks of the procedure, side effects of the proposed treatment, the likelihood of the patient achieving the goals of the procedure, and any potential problems that might occur during the procedure or recuperation. Informed consent has been obtained.  Description of procedure:  The patient was taken to the operating room and general anesthesia was induced.  The patient was placed in the dorsal lithotomy position, prepped and draped in the usual sterile fashion, and preoperative antibiotics were administered. A preoperative time-out was performed.   A longitudinal incision was made in the median raphae and carried through the dartos fascia with a combination of cautery and blunt dissection down to the hydrocele sac.  The hydrocele sac was bluntly dissected free from the scrotal walls.  The incision had to be extended for an additional centimeter in order to deliver the hydrocele into the operative field.  The hydrocele sac was incised anteriorly and 900 mL of straw-colored fluid was suctioned.  The anterior aspect sac was opened with cautery.  The testis and epididymis were normal in appearance.  There was no evidence of a communicating hydrocele.  The hydrocele sac was then excised with cautery leaving an approximately 2 cm rim around the testis and cord structures.  The sac was then everted posteriorly with the edges sutured with 3-0 running chromic suture.  The operative field was  examined and hemostasis was noted to be adequate.  The testis was delivered back into the left hemiscrotum in its anatomic position.  A 1/4 inch Penrose drain was placed through the dependent portion of the left hemiscrotum through a separate stab incision and the drain was secured with 0 nylon.  The skin edges were anesthetized with half percent plain Sensorcaine.  The dartos was closed with a running 3-0 Vicryl suture and the skin was closed with a running horizontal mattress suture of 3-0 chromic.  Bacitracin ointment, fluffs and scrotal support was applied.  After anesthetic reversal the patient was transported to the PACU in stable condition.   Abbie Sons, M.D.

## 2017-11-27 NOTE — Anesthesia Postprocedure Evaluation (Signed)
Anesthesia Post Note  Patient: Michael Martinez  Procedure(s) Performed: HYDROCELECTOMY ADULT (Left )  Patient location during evaluation: PACU Anesthesia Type: General Level of consciousness: awake and alert Pain management: pain level controlled Vital Signs Assessment: post-procedure vital signs reviewed and stable Respiratory status: spontaneous breathing and respiratory function stable Cardiovascular status: stable Anesthetic complications: no     Last Vitals:  Vitals:   11/27/17 1116 11/27/17 1120  BP: 116/71   Pulse: 69 69  Resp: 11 (!) 22  Temp: (!) (P) 36.1 C   SpO2: 100% 100%    Last Pain:  Vitals:   11/27/17 1116  TempSrc:   PainSc: (P) 0-No pain                 Fernando Torry K

## 2017-11-27 NOTE — Anesthesia Procedure Notes (Signed)
Procedure Name: LMA Insertion Date/Time: 11/27/2017 10:14 AM Performed by: Aline Brochure, CRNA Pre-anesthesia Checklist: Patient identified, Emergency Drugs available, Suction available and Patient being monitored Patient Re-evaluated:Patient Re-evaluated prior to induction Oxygen Delivery Method: Circle system utilized Preoxygenation: Pre-oxygenation with 100% oxygen Induction Type: IV induction Ventilation: Mask ventilation without difficulty LMA: LMA inserted LMA Size: 4.5 Number of attempts: 1 Placement Confirmation: positive ETCO2 and breath sounds checked- equal and bilateral Tube secured with: Tape Dental Injury: Teeth and Oropharynx as per pre-operative assessment

## 2017-11-27 NOTE — Transfer of Care (Signed)
Immediate Anesthesia Transfer of Care Note  Patient: Michael Martinez  Procedure(s) Performed: HYDROCELECTOMY ADULT (Left )  Patient Location: PACU  Anesthesia Type:General  Level of Consciousness: sedated  Airway & Oxygen Therapy: Patient connected to face mask oxygen  Post-op Assessment: Post -op Vital signs reviewed and stable  Post vital signs: Reviewed and stable  Last Vitals:  Vitals Value Taken Time  BP 116/71 11/27/2017 11:16 AM  Temp    Pulse 69 11/27/2017 11:16 AM  Resp 11 11/27/2017 11:16 AM  SpO2 100 % 11/27/2017 11:16 AM    Last Pain:  Vitals:   11/27/17 0905  TempSrc: Tympanic  PainSc: 0-No pain         Complications: No apparent anesthesia complications

## 2017-11-28 LAB — SURGICAL PATHOLOGY

## 2017-11-30 ENCOUNTER — Ambulatory Visit: Payer: Self-pay | Admitting: Family Medicine

## 2017-11-30 DIAGNOSIS — N433 Hydrocele, unspecified: Secondary | ICD-10-CM

## 2017-11-30 NOTE — Progress Notes (Signed)
Patient presents today for drain, suture removal. He states that a couple days after surgery his drain fell out on it's own. He only had a suture attached to the skin. I removed the suture and observed the area. He has mild swelling,bruising and redness at the incision sight. It looks like it is healing well. Patient was advised to keep his testicles supported well while it's healing. He is scheduled for a 1 month follow up with [facility]. He was advised to call if he had any problems. Patient voiced understanding.

## 2017-12-06 ENCOUNTER — Other Ambulatory Visit: Payer: Self-pay

## 2017-12-25 ENCOUNTER — Ambulatory Visit: Payer: Self-pay | Admitting: Urology

## 2017-12-31 ENCOUNTER — Ambulatory Visit: Payer: Self-pay | Admitting: Urology

## 2018-01-03 ENCOUNTER — Ambulatory Visit (INDEPENDENT_AMBULATORY_CARE_PROVIDER_SITE_OTHER): Payer: Self-pay | Admitting: Urology

## 2018-01-03 ENCOUNTER — Encounter: Payer: Self-pay | Admitting: Urology

## 2018-01-03 VITALS — BP 138/86 | HR 94 | Ht 72.0 in | Wt 257.1 lb

## 2018-01-03 DIAGNOSIS — Z09 Encounter for follow-up examination after completed treatment for conditions other than malignant neoplasm: Secondary | ICD-10-CM

## 2018-01-03 DIAGNOSIS — N5089 Other specified disorders of the male genital organs: Secondary | ICD-10-CM

## 2018-01-03 NOTE — Progress Notes (Signed)
01/03/2018 1:30 PM   Michael Martinez Jan 01, 1956 [id number]  Referring provider: Susy Frizzle, MD [address] [place], [place] [address]  Chief Complaint  Patient presents with  . Routine Post Op    HPI: 62 year old male presents for postop follow-up.  He underwent left hydrocelectomy on 11/27/2017 with 900 mL of hydrocele fluid removed.  A drain was placed postoperatively however he states the drain came out on postoperative day #1.  He has no pain or discomfort however states his left hemiscrotum is moderately enlarged.   PMH: Past Medical History:  Diagnosis Date  . Arthritis    RA  . Bronchitis   . Cellulitis and abscess of lower extremity 03/24/2014   RT LEG  . Cellulitis of right lower extremity    Recurrent  . Chronic diastolic CHF (congestive heart failure) ([place])    a. 11/2013 Echo: EF 45-50%, mild LVH, gr 1 DD, mildly dil LA.improved since weight loss  . Clotting disorder (HCC)    deep vein thrombosis left leg  . DVT (deep venous thrombosis) ([place]) 2006   "after ankle surgery"  . Essential hypertension    not being treated due to extreme weight loss  . GERD (gastroesophageal reflux disease)    not since weight loss  . Gout   . Hypercholesteremia   . 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 Obesity    not since gastric sleeve  . OSA on CPAP    not using CPAP anymore after gastric sleeve  . Peripheral edema    chronic. wears compression stockings  . Pulmonary embolism ([place]) 2017   after gastric sleeve  . Shortness of breath    not since weight loss  . Sleep apnea   . Type II diabetes mellitus (HCC)    not since gastric sleeve surgery    Surgical History: Past Surgical History:  Procedure Laterality Date  . gastric sleave  01/2016  . HERNIA REPAIR    . HYDROCELE EXCISION Left 11/27/2017   Procedure: HYDROCELECTOMY ADULT;  Surgeon: Abbie Sons, MD;  Location: [facility] ORS;  Service: Urology;  Laterality: Left;  . SKIN CANCER EXCISION    .  TONSILLECTOMY      Home Medications:  Allergies as of 01/03/2018      Reactions   Penicillins Anaphylaxis, Hives, Shortness Of Breath, Swelling   Pt complained of chest tightness, pt very diaphoretic, pt chest, arms red, difficulty breathing, ALL CILLINS, Has patient had a PCN reaction causing immediate rash, facial/tongue/throat swelling, SOB or lightheadedness with hypotension: Yes Has patient had a PCN reaction causing severe rash involving mucus membranes or skin necrosis: No Has patient had a PCN reaction that required hospitalization No Has patient had a PCN reaction occurring within the last 10 years: No If all of the above answers are   Zosyn [piperacillin Sod-tazobactam So] Anaphylaxis, Hives, Shortness Of Breath, Swelling   Adhesive [tape] Other (See Comments)    Please use "paper" tape      Medication List        Accurate as of 01/03/18  1:30 PM. Always use your most recent med list.          CALCIUM PO Take 1,000 mg by mouth daily.   fluticasone 50 MCG/ACT nasal spray Commonly known as:  FLONASE Place 2 sprays into both nostrils daily.   hydrOXYzine 25 MG tablet Commonly known as:  ATARAX/VISTARIL Take 25 mg by mouth as needed for anxiety (sleep).  levocetirizine 5 MG tablet Commonly known as:  XYZAL Take 1 tablet (5 mg total) by mouth every evening.   multivitamin with minerals tablet Take 1 tablet by mouth daily.   valACYclovir 500 MG tablet Commonly known as:  VALTREX Take 500 mg by mouth 2 (two) times daily as needed (fever blister).   vardenafil 20 MG tablet Commonly known as:  LEVITRA Take 1 tablet (20 mg total) by mouth daily as needed for erectile dysfunction.       Allergies:  Allergies  Allergen Reactions  . Penicillins Anaphylaxis, Hives, Shortness Of Breath and Swelling    Pt complained of chest tightness, pt very diaphoretic, pt chest, arms red, difficulty breathing, ALL CILLINS, Has patient had a PCN reaction causing immediate rash,  facial/tongue/throat swelling, SOB or lightheadedness with hypotension: Yes Has patient had a PCN reaction causing severe rash involving mucus membranes or skin necrosis: No Has patient had a PCN reaction that required hospitalization No Has patient had a PCN reaction occurring within the last 10 years: No If all of the above answers are  . Zosyn [Piperacillin Sod-Tazobactam So] Anaphylaxis, Hives, Shortness Of Breath and Swelling  . Adhesive [Tape] Other (See Comments)     Please use "paper" tape    Family History: Family History  Problem Relation Age of Onset  . Stroke Mother   . Hypertension Mother   . Asthma Mother   . Tuberculosis Mother   . Hypertension Father   . Asthma Sister   . Asthma Sister   . Asthma Daughter   . Prostate cancer Neg Hx   . Kidney cancer Neg Hx   . Bladder Cancer Neg Hx     Social History:  reports that he has never smoked. He has never used smokeless tobacco. He reports that he does not drink alcohol or use drugs.  ROS: UROLOGY Frequent Urination?: No Hard to postpone urination?: No Burning/pain with urination?: No Get up at night to urinate?: No Leakage of urine?: No Urine stream starts and stops?: No Trouble starting stream?: No Do you have to strain to urinate?: No Blood in urine?: No Urinary tract infection?: No Sexually transmitted disease?: No Injury to kidneys or bladder?: No Painful intercourse?: No Weak stream?: No Erection problems?: No Penile pain?: No  Gastrointestinal Nausea?: No Vomiting?: No Indigestion/heartburn?: No Diarrhea?: No  Constitutional Fever: No Night sweats?: No Weight loss?: No Fatigue?: No  Skin Skin rash/lesions?: No Itching?: No  Eyes Blurred vision?: No Double vision?: No  Ears/Nose/Throat Sore throat?: No Sinus problems?: No  Hematologic/Lymphatic Swollen glands?: No Easy bruising?: Yes  Cardiovascular Leg swelling?: No Chest pain?: No  Respiratory Cough?: No Shortness of  breath?: No  Endocrine Excessive thirst?: No  Musculoskeletal Back pain?: No Joint pain?: No  Neurological Headaches?: No Dizziness?: No  Psychologic Depression?: No Anxiety?: No  Physical Exam: BP 138/86 (BP Location: Left Arm, Patient Position: Sitting, Cuff Size: Large)   Pulse 94   Ht 6' (1.829 m)   Wt 257 lb 1.6 oz (116.6 kg)   BMI 34.87 kg/m   Constitutional:  Alert and oriented, No acute distress. GU: Incision is healed.  No scrotal erythema or drainage.  Moderate left hemiscrotal swelling.  Difficult to tell whether fluid or hematoma.  No tenderness present.    Assessment & Plan:   Status post left hydrocelectomy.  There is moderate left hemiscrotal swelling present.  Will schedule a scrotal ultrasound.  He will be notified with results however would lean towards conservative management  since he is asymptomatic.  Abbie Sons, [place] [address], [place], [place] [address] [phone]

## 2018-01-30 ENCOUNTER — Ambulatory Visit (HOSPITAL_COMMUNITY)
Admission: RE | Admit: 2018-01-30 | Discharge: 2018-01-30 | Disposition: A | Discharge status: Home / Self Care | Payer: Self-pay | Source: Ambulatory Visit | Attending: Urology | Admitting: Urology

## 2018-01-30 DIAGNOSIS — N5089 Other specified disorders of the male genital organs: Secondary | ICD-10-CM | POA: Insufficient documentation

## 2018-01-30 DIAGNOSIS — N433 Hydrocele, unspecified: Secondary | ICD-10-CM | POA: Insufficient documentation

## 2018-02-04 ENCOUNTER — Telehealth: Payer: Self-pay

## 2018-02-04 NOTE — Telephone Encounter (Signed)
-----   Message from Abbie Sons, MD sent at 02/02/2018  1:42 PM EDT ----- Scrotal ultrasound consistent with a hematoma.  If he is not having significant pain or symptoms would recommend giving this time in hopes that it will resolve.  Recommend a follow-up visit in 3 months and to call earlier for any bothersome symptoms.

## 2018-02-04 NOTE — Telephone Encounter (Signed)
Pt informed, will call if any problems persist. Please set up appointment for 3 months.

## 2018-02-05 ENCOUNTER — Telehealth: Payer: Self-pay | Admitting: Urology

## 2018-02-05 NOTE — Telephone Encounter (Signed)
App made and mailed ° °Michael Martinez ° °

## 2018-04-02 ENCOUNTER — Other Ambulatory Visit: Payer: Self-pay | Admitting: Family Medicine

## 2018-05-07 ENCOUNTER — Ambulatory Visit: Payer: Self-pay | Admitting: Urology

## 2018-06-22 ENCOUNTER — Other Ambulatory Visit: Payer: Self-pay | Admitting: Family Medicine

## 2018-12-11 ENCOUNTER — Other Ambulatory Visit: Payer: Self-pay | Admitting: Family Medicine

## 2018-12-23 ENCOUNTER — Ambulatory Visit: Payer: Self-pay | Admitting: Family Medicine

## 2018-12-23 ENCOUNTER — Telehealth: Payer: Self-pay | Admitting: General Practice

## 2018-12-23 ENCOUNTER — Telehealth: Payer: Self-pay | Admitting: *Deleted

## 2018-12-23 ENCOUNTER — Other Ambulatory Visit: Payer: Self-pay

## 2018-12-23 ENCOUNTER — Telehealth: Payer: Self-pay | Admitting: Family Medicine

## 2018-12-23 DIAGNOSIS — Z20822 Contact with and (suspected) exposure to covid-19: Secondary | ICD-10-CM

## 2018-12-23 NOTE — Telephone Encounter (Signed)
I'm having symptoms during the night.  No exposure to COVID-19 that I know of.  Started yesterday I was fine except for a sore throat.   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     When I got home I have a runny nose, chest congestion, sweating a lot, went through 5 T shirts last night.     I'm feeling bad.    I'm very tired.   I'm hoarse.   See triage notes.  He is going to call his PCP Dr. Jenna Luo at [facility] when their office opens this morning at 8:00 AM.  I instructed him to stay home and isolate himself.    I went over the home care advice for staying home and isolating in his own bedroom and bathroom to protect his wife.   Also to wear a mask when he is around her.     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    I let him know Dr. Dennard Schaumann will further advise him if his test is positive.  He was agreeable to this plan.   His call was received via the [place] on [email or web address].     Reason for Disposition . [1] COVID-19 infection suspected by caller or triager AND [2] mild symptoms (cough, fever, or others) AND [2] no complications or SOB  Answer Assessment - Initial Assessment Questions 1. COVID-19 DIAGNOSIS: "Who made your Coronavirus (COVID-19) diagnosis?" "Was it confirmed by a positive lab test?" If not diagnosed by a HCP, ask "Are there lots of cases (community spread) where you live?" (See public health department website, if unsure)     *No Answer* 2. ONSET: "When did the COVID-19 symptoms start?"      Yesterday my symptoms started.  See documentation.  I sweated through 5 T-shirts soaking wet last night.  I have a sore throat, runny nose, very tired.  I'm hoarse. 3. WORST SYMPTOM: "What is your worst symptom?" (e.g., cough, fever, shortness of breath, muscle aches)     Sweating  profusely 4. COUGH: "Do you have a cough?" If so, ask: "How bad is the cough?"       Occasional cough with chest congestion 5. FEVER: "Do you have a fever?" If so, ask: "What is your temperature, how was it measured, and when did it start?"     98.1 6. RESPIRATORY STATUS: "Describe your breathing?" (e.g., shortness of breath, wheezing, unable to speak)      Tightness in chest when I cough 7. BETTER-SAME-WORSE: "Are you getting better, staying the same or getting worse compared to yesterday?"  If getting worse, ask, "In what way?"     Worse 8. HIGH RISK DISEASE: "Do you have any chronic medical problems?" (e.g., asthma, heart or lung disease, weak immune system, etc.)     I had weight loss surgery 3 years ago.   9. PREGNANCY: "Is there any chance you are pregnant?" "When was your last menstrual period?"     N/A 10. OTHER SYMPTOMS: "Do you have any other symptoms?"  (e.g., chills, fatigue, headache, loss of smell or taste, muscle pain, sore throat)       No change in taste or smell, no muscle pain, body aches or chills.   I slept under 2 quilts and was sweating profusely.   No  GI symptoms  Protocols used: CORONAVIRUS (COVID-19) DIAGNOSED OR SUSPECTED-A-AH

## 2018-12-23 NOTE — Telephone Encounter (Signed)
Called pt to scheduled covid-19 testing. No answer, lvm for pt to return call for scheduling.

## 2018-12-23 NOTE — Telephone Encounter (Signed)
I sent a request to the [facility] testing center at [facility].  They should be contacting him today to schedule testing. He should contact us if he has not heard from them in 24 hours.

## 2018-12-23 NOTE — Telephone Encounter (Signed)
-----   Message from Susy Frizzle, MD sent at 12/23/2018  9:40 AM EDT ----- Patient has cough, chest congestion and subjective fevers.  He is a high risk patient with history of diabetes, pulmonary embolism, sleep apnea and I feel he needs COVID testing.

## 2018-12-23 NOTE — Telephone Encounter (Signed)
Patient returned call and made aware. States that he is scheduled for testing today.

## 2018-12-23 NOTE — Telephone Encounter (Signed)
Called pt to scheduled covid-19 testing. No answer, lvm for pt to return call for scheduling.   Pt was referred by Dr. Jenna Luo.

## 2018-12-23 NOTE — Telephone Encounter (Signed)
Note    ----- Message from Susy Frizzle, MD sent at 12/23/2018  9:40 AM EDT ----- Patient has cough, chest congestion and subjective fevers.  He is a high risk patient with history of diabetes, pulmonary embolism, sleep apnea and I feel he needs COVID testing.       Pt scheduled for today at [facility] site. Testing process reviewed with patient, verbalizes understanding.

## 2018-12-23 NOTE — Telephone Encounter (Signed)
Call placed to patient. LMTRC.  

## 2018-12-23 NOTE — Telephone Encounter (Signed)
Received call from patient.   Reports that he has x1 day of cough with clear mucus, chest congestion, nasal drainage, hoarseness, and increased sweating. States that he soaked through multiple shirts last night.   Denies fever. Last temp 98.1 oral.   Reports that he is taking Mucinex DM and Nyquil. States that he called on call nurse and was advised to contact our office for possible COVID testing.   MD please advise.

## 2018-12-23 NOTE — Telephone Encounter (Signed)
Attempted to contact pt to schedule COVID testing per Dr Maryclare Labrador; left message on voicemail for pt to return call.

## 2018-12-26 LAB — NOVEL CORONAVIRUS, NAA: SARS-CoV-2, NAA: NOT DETECTED

## 2018-12-30 ENCOUNTER — Ambulatory Visit (INDEPENDENT_AMBULATORY_CARE_PROVIDER_SITE_OTHER): Payer: Self-pay | Admitting: Family Medicine

## 2018-12-30 ENCOUNTER — Telehealth: Payer: Self-pay | Admitting: Family Medicine

## 2018-12-30 ENCOUNTER — Other Ambulatory Visit: Payer: Self-pay

## 2018-12-30 DIAGNOSIS — J069 Acute upper respiratory infection, unspecified: Secondary | ICD-10-CM

## 2018-12-30 MED ORDER — AZITHROMYCIN 250 MG PO TABS: ORAL_TABLET | ORAL | 0 refills | Status: AC

## 2018-12-30 NOTE — Telephone Encounter (Signed)
Pt needs [place] to call in z- pack, his covid testing was negative. His sinus drainage is now yellow/green tinged.   He uses [facility].

## 2018-12-30 NOTE — Progress Notes (Signed)
Subjective:    Patient ID: Michael Martinez, male    DOB: 17-Jan-1956, 63 y.o.   MRN: [id number]  HPI Patient is being seen today as a phone visit.  Patient consents to be seen over the telephone.  Phone call began at 323.  Phone call ended at 332.  Patient states that he developed symptoms of a "sinus infection" last week.  He developed subjective fevers and chills Sunday night.  Monday he developed head congestion and rhinorrhea and a cough.  He was tested for COVID-19 on Monday and the test returned negative.  Throughout the week he is continued to have chest congestion and a cough.  He also has sinus congestion.  He continues to blow yellow and green snot from his nose when he sneezes or blows his nose.  He also has a cough productive of yellow sputum.  He denies any fever.  He denies any chest pain.  He denies any shortness of breath.  He has had some chest congestion and what sound like rhonchi his description.  He has been taking Mucinex DM.  He is requesting a Z-Pak today for a "sinus infection". Past Medical History:  Diagnosis Date  . Arthritis    RA  . Bronchitis   . Cellulitis and abscess of lower extremity 03/24/2014   RT LEG  . Cellulitis of right lower extremity    Recurrent  . Chronic diastolic CHF (congestive heart failure) ([place])    a. 11/2013 Echo: EF 45-50%, mild LVH, gr 1 DD, mildly dil LA.improved since weight loss  . Clotting disorder (HCC)    deep vein thrombosis left leg  . DVT (deep venous thrombosis) ([place]) 2006   "after ankle surgery"  . Essential hypertension    not being treated due to extreme weight loss  . GERD (gastroesophageal reflux disease)    not since weight loss  . Gout   . Hypercholesteremia   . Melanoma ([place])    left ear  . Obesity    not since gastric sleeve  . OSA on CPAP    not using CPAP anymore after gastric sleeve  . Peripheral edema    chronic. wears compression stockings  . Pulmonary embolism ([place]) 2017   after gastric sleeve  .  Shortness of breath    not since weight loss  . Sleep apnea   . Type II diabetes mellitus ([place])    not since gastric sleeve surgery   Past Surgical History:  Procedure Laterality Date  . gastric sleave  01/2016  . HERNIA REPAIR    . HYDROCELE EXCISION Left 11/27/2017   Procedure: HYDROCELECTOMY ADULT;  Surgeon: Abbie Sons, MD;  Location: [facility] ORS;  Service: Urology;  Laterality: Left;  . SKIN CANCER EXCISION    . TONSILLECTOMY     Current Outpatient Medications on File Prior to Visit  Medication Sig Dispense Refill  . CALCIUM PO Take 1,000 mg by mouth daily.     . fluticasone (FLONASE) 50 MCG/ACT nasal spray Place 2 sprays into both nostrils daily. 16 g 6  . hydrOXYzine (ATARAX/VISTARIL) 25 MG tablet Take 25 mg by mouth as needed for anxiety (sleep).    . indomethacin (INDOCIN) 50 MG capsule TAKE 1 CAPSULE BY MOUTH THREE TIMES DAILY AS NEEDED 30 capsule 5  . levocetirizine (XYZAL) 5 MG tablet Take 1 tablet (5 mg total) by mouth every evening. 30 tablet 0  . Multiple Vitamins-Minerals (MULTIVITAMIN WITH MINERALS) tablet Take 1 tablet by mouth daily.    Marland Kitchen  valACYclovir (VALTREX) 500 MG tablet Take 500 mg by mouth 2 (two) times daily as needed (fever blister).    . valACYclovir (VALTREX) 500 MG tablet TAKE 1 TABLET BY MOUTH TWICE DAILY AS NEEDED FOR FEVER BLISTERS FOR 5 DAYS 20 tablet 0  . vardenafil (LEVITRA) 20 MG tablet Take 1 tablet (20 mg total) by mouth daily as needed for erectile dysfunction. 40 tablet 3   No current facility-administered medications on file prior to visit.    Allergies  Allergen Reactions  . Penicillins Anaphylaxis, Hives, Shortness Of Breath and Swelling    Pt complained of chest tightness, pt very diaphoretic, pt chest, arms red, difficulty breathing, ALL CILLINS, Has patient had a PCN reaction causing immediate rash, facial/tongue/throat swelling, SOB or lightheadedness with hypotension: Yes Has patient had a PCN reaction causing severe rash involving  mucus membranes or skin necrosis: No Has patient had a PCN reaction that required hospitalization No Has patient had a PCN reaction occurring within the last 10 years: No If all of the above answers are  . Zosyn [Piperacillin Sod-Tazobactam So] Anaphylaxis, Hives, Shortness Of Breath and Swelling  . Adhesive [Tape] Other (See Comments)     Please use "paper" tape   Social History   Socioeconomic History  . Marital status: Married    Spouse name: Not on file  . Number of children: Not on file  . Years of education: Not on file  . Highest education level: Not on file  Occupational History  . Not on file  Social Needs  . Financial resource strain: Not on file  . Food insecurity    Worry: Not on file    Inability: Not on file  . Transportation needs    Medical: Not on file    Non-medical: Not on file  Tobacco Use  . Smoking status: Never Smoker  . Smokeless tobacco: Never Used  Substance and Sexual Activity  . Alcohol use: No    Alcohol/week: 0.0 standard drinks  . Drug use: No  . Sexual activity: Yes    Birth control/protection: None  Lifestyle  . Physical activity    Days per week: Not on file    Minutes per session: Not on file  . Stress: Not on file  Relationships  . Social Herbalist on phone: Not on file    Gets together: Not on file    Attends religious service: Not on file    Active member of club or organization: Not on file    Attends meetings of clubs or organizations: Not on file    Relationship status: Not on file  . Intimate partner violence    Fear of current or ex partner: Not on file    Emotionally abused: Not on file    Physically abused: Not on file    Forced sexual activity: Not on file  Other Topics Concern  . Not on file  Social History Narrative  . Not on file      Review of Systems  All other systems reviewed and are negative.      Objective:   Physical Exam   No physical exam could be performed today as the patient  was seen over the telephone however he was speaking in full and complete sentences with no respiratory distress and is currently afebrile     Assessment & Plan:  1. URI, acute I explained to the patient that I did not think he needed an antibiotic.  His symptoms sound  more consistent with a viral upper respiratory infection.  I explained to the patient that this was likely just, take some additional time to run its course.  Anticipate 3-5 more days.  I have recommended that he use Coricidin HBP for symptoms.  I will gladly give the patient a Z-Pak with strict instructions not to fill unless he develops high fever, worsening chest congestion, purulent sputum, shortness of breath, or worsening symptoms.  Otherwise I will treat the patient symptomatically and anticipate gradual resolution spontaneously

## 2018-12-30 NOTE — Telephone Encounter (Signed)
Pt aware and apt made 

## 2018-12-30 NOTE — Telephone Encounter (Signed)
Please schedule phone visit this afternoon.

## 2019-07-21 ENCOUNTER — Ambulatory Visit: Payer: PPO | Attending: Internal Medicine

## 2019-07-21 ENCOUNTER — Other Ambulatory Visit: Payer: Self-pay

## 2019-07-21 DIAGNOSIS — U071 COVID-19: Secondary | ICD-10-CM | POA: Insufficient documentation

## 2019-07-21 DIAGNOSIS — Z20822 Contact with and (suspected) exposure to covid-19: Secondary | ICD-10-CM

## 2019-07-22 LAB — NOVEL CORONAVIRUS, NAA: SARS-CoV-2, NAA: DETECTED — AB

## 2019-07-23 ENCOUNTER — Telehealth: Payer: Self-pay | Admitting: Nurse Practitioner

## 2019-07-23 ENCOUNTER — Ambulatory Visit: Payer: Self-pay

## 2019-07-23 NOTE — Telephone Encounter (Signed)
Provided Covid -19 test results   To Patient . Voiced understanding. Provided care advice.  Will notify HD. Reviewed Isolation protocol  With protocol with Patient.

## 2019-07-23 NOTE — Telephone Encounter (Signed)
Called to Discuss with patient about Covid symptoms and the use of bamlanivimab, a monoclonal antibody infusion for those with mild to moderate Covid symptoms and at a high risk of hospitalization.     Pt is qualified for this infusion at the [facility] infusion center due to co-morbid conditions and/or a member of an at-risk group.     Unable to reach pt  

## 2019-08-26 ENCOUNTER — Other Ambulatory Visit: Payer: Self-pay | Admitting: Family Medicine

## 2019-10-09 DIAGNOSIS — Z9884 Bariatric surgery status: Secondary | ICD-10-CM | POA: Diagnosis not present

## 2019-10-09 DIAGNOSIS — I4901 Ventricular fibrillation: Secondary | ICD-10-CM | POA: Diagnosis not present

## 2019-10-09 DIAGNOSIS — I469 Cardiac arrest, cause unspecified: Secondary | ICD-10-CM | POA: Diagnosis not present

## 2019-10-09 DIAGNOSIS — E119 Type 2 diabetes mellitus without complications: Secondary | ICD-10-CM | POA: Diagnosis not present

## 2019-10-09 DIAGNOSIS — I10 Essential (primary) hypertension: Secondary | ICD-10-CM | POA: Diagnosis not present

## 2019-10-16 DEATH — deceased
